# Patient Record
Sex: Male | Born: 1960
Health system: Southern US, Community
[De-identification: ages and names within clinical notes are randomized; demographics above are authoritative.]

## PROBLEM LIST (undated history)

## (undated) DIAGNOSIS — Z21 Asymptomatic human immunodeficiency virus [HIV] infection status: Secondary | ICD-10-CM

## (undated) DIAGNOSIS — F411 Generalized anxiety disorder: Secondary | ICD-10-CM

## (undated) DIAGNOSIS — N200 Calculus of kidney: Secondary | ICD-10-CM

## (undated) DIAGNOSIS — M5412 Radiculopathy, cervical region: Secondary | ICD-10-CM

## (undated) DIAGNOSIS — E785 Hyperlipidemia, unspecified: Secondary | ICD-10-CM

## (undated) DIAGNOSIS — B181 Chronic viral hepatitis B without delta-agent: Secondary | ICD-10-CM

## (undated) DIAGNOSIS — L408 Other psoriasis: Secondary | ICD-10-CM

## (undated) DIAGNOSIS — K219 Gastro-esophageal reflux disease without esophagitis: Secondary | ICD-10-CM

## (undated) DIAGNOSIS — I10 Essential (primary) hypertension: Secondary | ICD-10-CM

## (undated) DIAGNOSIS — E669 Obesity, unspecified: Secondary | ICD-10-CM

## (undated) DIAGNOSIS — B20 Human immunodeficiency virus [HIV] disease: Secondary | ICD-10-CM

## (undated) DIAGNOSIS — M1990 Unspecified osteoarthritis, unspecified site: Secondary | ICD-10-CM

## (undated) DIAGNOSIS — A6000 Herpesviral infection of urogenital system, unspecified: Secondary | ICD-10-CM

## (undated) DIAGNOSIS — IMO0002 Reserved for concepts with insufficient information to code with codable children: Secondary | ICD-10-CM

## (undated) DIAGNOSIS — G5601 Carpal tunnel syndrome, right upper limb: Secondary | ICD-10-CM

## (undated) HISTORY — DX: Human immunodeficiency virus (HIV) disease: B20

## (undated) HISTORY — DX: Asymptomatic human immunodeficiency virus (hiv) infection status: Z21

## (undated) HISTORY — DX: Generalized anxiety disorder: F41.1

## (undated) HISTORY — DX: Other psoriasis: L40.8

## (undated) HISTORY — DX: Gastro-esophageal reflux disease without esophagitis: K21.9

## (undated) HISTORY — DX: Hyperlipidemia, unspecified: E78.5

## (undated) HISTORY — DX: Obesity, unspecified: E66.9

## (undated) HISTORY — DX: Unspecified osteoarthritis, unspecified site: M19.90

## (undated) HISTORY — DX: Carpal tunnel syndrome, right upper limb: G56.01

## (undated) HISTORY — DX: Chronic viral hepatitis B without delta-agent: B18.1

## (undated) HISTORY — PX: COLONOSCOPY: SHX174

## (undated) HISTORY — DX: Calculus of kidney: N20.0

## (undated) HISTORY — DX: Essential (primary) hypertension: I10

## (undated) HISTORY — DX: Herpesviral infection of urogenital system, unspecified: A60.00

## (undated) HISTORY — DX: Reserved for concepts with insufficient information to code with codable children: IMO0002

## (undated) HISTORY — DX: Radiculopathy, cervical region: M54.12

---

## 1998-07-25 HISTORY — PX: CYSTECTOMY: SUR359

## 1998-09-23 ENCOUNTER — Emergency Department (HOSPITAL_COMMUNITY): Admission: EM | Admit: 1998-09-23 | Discharge: 1998-09-23 | Payer: Self-pay | Admitting: Emergency Medicine

## 1998-09-23 ENCOUNTER — Encounter: Payer: Self-pay | Admitting: Emergency Medicine

## 1999-01-06 ENCOUNTER — Encounter (INDEPENDENT_AMBULATORY_CARE_PROVIDER_SITE_OTHER): Payer: Self-pay | Admitting: *Deleted

## 1999-01-06 ENCOUNTER — Ambulatory Visit (HOSPITAL_COMMUNITY): Admission: RE | Admit: 1999-01-06 | Discharge: 1999-01-06 | Payer: Self-pay | Admitting: Internal Medicine

## 1999-01-06 ENCOUNTER — Encounter: Admission: RE | Admit: 1999-01-06 | Discharge: 1999-01-06 | Payer: Self-pay | Admitting: Internal Medicine

## 1999-01-06 ENCOUNTER — Encounter: Payer: Self-pay | Admitting: Internal Medicine

## 1999-01-06 LAB — CONVERTED CEMR LAB: CD4 Count: 9 microliters

## 1999-01-09 ENCOUNTER — Encounter: Payer: Self-pay | Admitting: Emergency Medicine

## 1999-01-09 ENCOUNTER — Inpatient Hospital Stay (HOSPITAL_COMMUNITY): Admission: EM | Admit: 1999-01-09 | Discharge: 1999-01-11 | Payer: Self-pay | Admitting: Emergency Medicine

## 1999-01-25 ENCOUNTER — Encounter: Admission: RE | Admit: 1999-01-25 | Discharge: 1999-01-25 | Payer: Self-pay | Admitting: Internal Medicine

## 1999-02-12 ENCOUNTER — Encounter: Admission: RE | Admit: 1999-02-12 | Discharge: 1999-02-12 | Payer: Self-pay | Admitting: Internal Medicine

## 1999-02-12 ENCOUNTER — Ambulatory Visit (HOSPITAL_COMMUNITY): Admission: RE | Admit: 1999-02-12 | Discharge: 1999-02-12 | Payer: Self-pay | Admitting: Internal Medicine

## 1999-03-01 ENCOUNTER — Encounter: Admission: RE | Admit: 1999-03-01 | Discharge: 1999-03-01 | Payer: Self-pay | Admitting: Internal Medicine

## 1999-05-20 ENCOUNTER — Encounter: Admission: RE | Admit: 1999-05-20 | Discharge: 1999-05-20 | Payer: Self-pay | Admitting: Internal Medicine

## 1999-05-20 ENCOUNTER — Ambulatory Visit (HOSPITAL_COMMUNITY): Admission: RE | Admit: 1999-05-20 | Discharge: 1999-05-20 | Payer: Self-pay | Admitting: Internal Medicine

## 1999-05-31 ENCOUNTER — Encounter: Admission: RE | Admit: 1999-05-31 | Discharge: 1999-05-31 | Payer: Self-pay | Admitting: Internal Medicine

## 1999-06-14 ENCOUNTER — Other Ambulatory Visit: Admission: RE | Admit: 1999-06-14 | Discharge: 1999-06-14 | Payer: Self-pay

## 1999-06-14 ENCOUNTER — Encounter (INDEPENDENT_AMBULATORY_CARE_PROVIDER_SITE_OTHER): Payer: Self-pay

## 1999-07-08 ENCOUNTER — Ambulatory Visit (HOSPITAL_COMMUNITY): Admission: RE | Admit: 1999-07-08 | Discharge: 1999-07-08 | Payer: Self-pay | Admitting: Internal Medicine

## 1999-07-08 ENCOUNTER — Encounter: Payer: Self-pay | Admitting: Internal Medicine

## 1999-07-08 ENCOUNTER — Encounter: Admission: RE | Admit: 1999-07-08 | Discharge: 1999-07-08 | Payer: Self-pay | Admitting: Internal Medicine

## 1999-09-27 ENCOUNTER — Ambulatory Visit (HOSPITAL_COMMUNITY): Admission: RE | Admit: 1999-09-27 | Discharge: 1999-09-27 | Payer: Self-pay | Admitting: Internal Medicine

## 1999-09-27 ENCOUNTER — Encounter: Admission: RE | Admit: 1999-09-27 | Discharge: 1999-09-27 | Payer: Self-pay | Admitting: Internal Medicine

## 1999-10-11 ENCOUNTER — Encounter: Admission: RE | Admit: 1999-10-11 | Discharge: 1999-10-11 | Payer: Self-pay | Admitting: Internal Medicine

## 1999-12-15 ENCOUNTER — Ambulatory Visit (HOSPITAL_COMMUNITY): Admission: RE | Admit: 1999-12-15 | Discharge: 1999-12-15 | Payer: Self-pay | Admitting: Internal Medicine

## 1999-12-15 ENCOUNTER — Encounter: Admission: RE | Admit: 1999-12-15 | Discharge: 1999-12-15 | Payer: Self-pay | Admitting: Internal Medicine

## 1999-12-28 ENCOUNTER — Encounter: Admission: RE | Admit: 1999-12-28 | Discharge: 1999-12-28 | Payer: Self-pay | Admitting: Internal Medicine

## 2000-04-19 ENCOUNTER — Encounter: Admission: RE | Admit: 2000-04-19 | Discharge: 2000-04-19 | Payer: Self-pay | Admitting: Internal Medicine

## 2000-04-19 ENCOUNTER — Ambulatory Visit (HOSPITAL_COMMUNITY): Admission: RE | Admit: 2000-04-19 | Discharge: 2000-04-19 | Payer: Self-pay | Admitting: Internal Medicine

## 2000-04-24 ENCOUNTER — Encounter: Admission: RE | Admit: 2000-04-24 | Discharge: 2000-04-24 | Payer: Self-pay | Admitting: Internal Medicine

## 2000-05-18 ENCOUNTER — Ambulatory Visit (HOSPITAL_COMMUNITY): Admission: RE | Admit: 2000-05-18 | Discharge: 2000-05-18 | Payer: Self-pay | Admitting: Internal Medicine

## 2000-05-18 ENCOUNTER — Encounter: Admission: RE | Admit: 2000-05-18 | Discharge: 2000-05-18 | Payer: Self-pay | Admitting: Internal Medicine

## 2000-08-15 ENCOUNTER — Encounter: Payer: Self-pay | Admitting: Internal Medicine

## 2000-08-15 ENCOUNTER — Encounter: Admission: RE | Admit: 2000-08-15 | Discharge: 2000-08-15 | Payer: Self-pay | Admitting: Internal Medicine

## 2000-08-15 ENCOUNTER — Ambulatory Visit (HOSPITAL_COMMUNITY): Admission: RE | Admit: 2000-08-15 | Discharge: 2000-08-15 | Payer: Self-pay | Admitting: Internal Medicine

## 2000-08-29 ENCOUNTER — Encounter: Admission: RE | Admit: 2000-08-29 | Discharge: 2000-08-29 | Payer: Self-pay | Admitting: Internal Medicine

## 2000-11-23 ENCOUNTER — Ambulatory Visit (HOSPITAL_COMMUNITY): Admission: RE | Admit: 2000-11-23 | Discharge: 2000-11-23 | Payer: Self-pay | Admitting: Internal Medicine

## 2000-11-23 ENCOUNTER — Encounter: Admission: RE | Admit: 2000-11-23 | Discharge: 2000-11-23 | Payer: Self-pay | Admitting: Internal Medicine

## 2001-01-22 ENCOUNTER — Encounter: Admission: RE | Admit: 2001-01-22 | Discharge: 2001-01-22 | Payer: Self-pay | Admitting: Infectious Diseases

## 2001-04-09 ENCOUNTER — Encounter: Admission: RE | Admit: 2001-04-09 | Discharge: 2001-04-09 | Payer: Self-pay | Admitting: Internal Medicine

## 2001-04-09 ENCOUNTER — Ambulatory Visit (HOSPITAL_COMMUNITY): Admission: RE | Admit: 2001-04-09 | Discharge: 2001-04-09 | Payer: Self-pay | Admitting: Internal Medicine

## 2001-05-02 ENCOUNTER — Encounter: Admission: RE | Admit: 2001-05-02 | Discharge: 2001-05-02 | Payer: Self-pay | Admitting: Internal Medicine

## 2001-05-02 ENCOUNTER — Encounter: Payer: Self-pay | Admitting: Internal Medicine

## 2001-05-02 ENCOUNTER — Ambulatory Visit (HOSPITAL_COMMUNITY): Admission: RE | Admit: 2001-05-02 | Discharge: 2001-05-02 | Payer: Self-pay | Admitting: Internal Medicine

## 2001-05-29 ENCOUNTER — Encounter: Admission: RE | Admit: 2001-05-29 | Discharge: 2001-05-29 | Payer: Self-pay | Admitting: Internal Medicine

## 2001-10-16 ENCOUNTER — Ambulatory Visit (HOSPITAL_COMMUNITY): Admission: RE | Admit: 2001-10-16 | Discharge: 2001-10-16 | Payer: Self-pay | Admitting: Internal Medicine

## 2001-10-16 ENCOUNTER — Encounter: Admission: RE | Admit: 2001-10-16 | Discharge: 2001-10-16 | Payer: Self-pay | Admitting: Internal Medicine

## 2001-10-23 ENCOUNTER — Encounter: Admission: RE | Admit: 2001-10-23 | Discharge: 2001-10-23 | Payer: Self-pay | Admitting: Internal Medicine

## 2002-02-07 ENCOUNTER — Ambulatory Visit (HOSPITAL_COMMUNITY): Admission: RE | Admit: 2002-02-07 | Discharge: 2002-02-07 | Payer: Self-pay | Admitting: Internal Medicine

## 2002-02-07 ENCOUNTER — Encounter: Admission: RE | Admit: 2002-02-07 | Discharge: 2002-02-07 | Payer: Self-pay | Admitting: Internal Medicine

## 2002-02-25 ENCOUNTER — Encounter: Admission: RE | Admit: 2002-02-25 | Discharge: 2002-02-25 | Payer: Self-pay | Admitting: Internal Medicine

## 2002-04-15 ENCOUNTER — Encounter: Admission: RE | Admit: 2002-04-15 | Discharge: 2002-04-15 | Payer: Self-pay | Admitting: Internal Medicine

## 2002-04-15 ENCOUNTER — Ambulatory Visit (HOSPITAL_COMMUNITY): Admission: RE | Admit: 2002-04-15 | Discharge: 2002-04-15 | Payer: Self-pay | Admitting: Internal Medicine

## 2002-05-07 ENCOUNTER — Encounter: Admission: RE | Admit: 2002-05-07 | Discharge: 2002-05-07 | Payer: Self-pay | Admitting: Internal Medicine

## 2002-06-05 ENCOUNTER — Encounter: Admission: RE | Admit: 2002-06-05 | Discharge: 2002-06-05 | Payer: Self-pay | Admitting: Infectious Diseases

## 2002-08-05 ENCOUNTER — Ambulatory Visit (HOSPITAL_COMMUNITY): Admission: RE | Admit: 2002-08-05 | Discharge: 2002-08-05 | Payer: Self-pay | Admitting: Internal Medicine

## 2002-08-05 ENCOUNTER — Encounter: Admission: RE | Admit: 2002-08-05 | Discharge: 2002-08-05 | Payer: Self-pay | Admitting: Internal Medicine

## 2002-10-15 ENCOUNTER — Encounter: Admission: RE | Admit: 2002-10-15 | Discharge: 2002-10-15 | Payer: Self-pay | Admitting: Internal Medicine

## 2003-01-24 ENCOUNTER — Ambulatory Visit (HOSPITAL_COMMUNITY): Admission: RE | Admit: 2003-01-24 | Discharge: 2003-01-24 | Payer: Self-pay | Admitting: Internal Medicine

## 2003-01-24 ENCOUNTER — Encounter: Payer: Self-pay | Admitting: Internal Medicine

## 2003-02-10 ENCOUNTER — Encounter: Admission: RE | Admit: 2003-02-10 | Discharge: 2003-02-10 | Payer: Self-pay | Admitting: Internal Medicine

## 2003-06-17 ENCOUNTER — Ambulatory Visit (HOSPITAL_COMMUNITY): Admission: RE | Admit: 2003-06-17 | Discharge: 2003-06-17 | Payer: Self-pay | Admitting: Internal Medicine

## 2003-06-17 ENCOUNTER — Encounter: Admission: RE | Admit: 2003-06-17 | Discharge: 2003-06-17 | Payer: Self-pay | Admitting: Internal Medicine

## 2003-06-17 ENCOUNTER — Encounter: Payer: Self-pay | Admitting: Internal Medicine

## 2003-07-01 ENCOUNTER — Encounter: Admission: RE | Admit: 2003-07-01 | Discharge: 2003-07-01 | Payer: Self-pay | Admitting: Internal Medicine

## 2003-07-28 ENCOUNTER — Encounter: Admission: RE | Admit: 2003-07-28 | Discharge: 2003-07-28 | Payer: Self-pay | Admitting: Infectious Diseases

## 2004-01-13 ENCOUNTER — Encounter: Admission: RE | Admit: 2004-01-13 | Discharge: 2004-01-13 | Payer: Self-pay | Admitting: Internal Medicine

## 2004-01-13 ENCOUNTER — Ambulatory Visit (HOSPITAL_COMMUNITY): Admission: RE | Admit: 2004-01-13 | Discharge: 2004-01-13 | Payer: Self-pay | Admitting: Internal Medicine

## 2004-01-27 ENCOUNTER — Encounter: Admission: RE | Admit: 2004-01-27 | Discharge: 2004-01-27 | Payer: Self-pay | Admitting: Internal Medicine

## 2004-06-21 ENCOUNTER — Ambulatory Visit: Payer: Self-pay | Admitting: Internal Medicine

## 2004-08-02 ENCOUNTER — Ambulatory Visit (HOSPITAL_COMMUNITY): Admission: RE | Admit: 2004-08-02 | Discharge: 2004-08-02 | Payer: Self-pay | Admitting: Internal Medicine

## 2004-08-02 ENCOUNTER — Ambulatory Visit: Payer: Self-pay | Admitting: Internal Medicine

## 2004-08-16 ENCOUNTER — Ambulatory Visit: Payer: Self-pay | Admitting: Internal Medicine

## 2004-12-27 ENCOUNTER — Ambulatory Visit: Payer: Self-pay | Admitting: Internal Medicine

## 2004-12-27 ENCOUNTER — Ambulatory Visit (HOSPITAL_COMMUNITY): Admission: RE | Admit: 2004-12-27 | Discharge: 2004-12-27 | Payer: Self-pay | Admitting: Internal Medicine

## 2005-01-11 ENCOUNTER — Ambulatory Visit: Payer: Self-pay | Admitting: Internal Medicine

## 2005-06-14 ENCOUNTER — Ambulatory Visit (HOSPITAL_COMMUNITY): Admission: RE | Admit: 2005-06-14 | Discharge: 2005-06-14 | Payer: Self-pay | Admitting: Internal Medicine

## 2005-06-14 ENCOUNTER — Encounter (INDEPENDENT_AMBULATORY_CARE_PROVIDER_SITE_OTHER): Payer: Self-pay | Admitting: *Deleted

## 2005-06-14 ENCOUNTER — Ambulatory Visit: Payer: Self-pay | Admitting: Internal Medicine

## 2005-06-14 LAB — CONVERTED CEMR LAB: CD4 Count: 270 microliters

## 2005-06-28 ENCOUNTER — Ambulatory Visit: Payer: Self-pay | Admitting: Internal Medicine

## 2006-01-19 ENCOUNTER — Encounter: Admission: RE | Admit: 2006-01-19 | Discharge: 2006-01-19 | Payer: Self-pay | Admitting: Internal Medicine

## 2006-01-19 ENCOUNTER — Ambulatory Visit: Payer: Self-pay | Admitting: Internal Medicine

## 2006-01-19 ENCOUNTER — Encounter (INDEPENDENT_AMBULATORY_CARE_PROVIDER_SITE_OTHER): Payer: Self-pay | Admitting: *Deleted

## 2006-01-22 DIAGNOSIS — J189 Pneumonia, unspecified organism: Secondary | ICD-10-CM | POA: Insufficient documentation

## 2006-02-14 ENCOUNTER — Ambulatory Visit: Payer: Self-pay | Admitting: Internal Medicine

## 2006-07-14 ENCOUNTER — Emergency Department (HOSPITAL_COMMUNITY): Admission: EM | Admit: 2006-07-14 | Discharge: 2006-07-14 | Payer: Self-pay | Admitting: Family Medicine

## 2006-07-28 DIAGNOSIS — B181 Chronic viral hepatitis B without delta-agent: Secondary | ICD-10-CM

## 2006-07-28 DIAGNOSIS — B20 Human immunodeficiency virus [HIV] disease: Secondary | ICD-10-CM

## 2006-07-28 DIAGNOSIS — K219 Gastro-esophageal reflux disease without esophagitis: Secondary | ICD-10-CM

## 2006-07-28 HISTORY — DX: Gastro-esophageal reflux disease without esophagitis: K21.9

## 2006-07-28 HISTORY — DX: Chronic viral hepatitis B without delta-agent: B18.1

## 2006-07-28 HISTORY — DX: Human immunodeficiency virus (HIV) disease: B20

## 2006-08-09 ENCOUNTER — Encounter (INDEPENDENT_AMBULATORY_CARE_PROVIDER_SITE_OTHER): Payer: Self-pay | Admitting: *Deleted

## 2006-08-09 ENCOUNTER — Ambulatory Visit: Payer: Self-pay | Admitting: Internal Medicine

## 2006-08-09 ENCOUNTER — Encounter: Admission: RE | Admit: 2006-08-09 | Discharge: 2006-08-09 | Payer: Self-pay | Admitting: Internal Medicine

## 2006-08-09 LAB — CONVERTED CEMR LAB
ALT: 31 units/L (ref 0–53)
AST: 33 units/L (ref 0–37)
Albumin: 5 g/dL (ref 3.5–5.2)
Alkaline Phosphatase: 83 units/L (ref 39–117)
BUN: 13 mg/dL (ref 6–23)
Basophils Absolute: 0 10*3/uL (ref 0.0–0.1)
Basophils Relative: 1 % (ref 0–1)
CD4 Count: 420 microliters
CO2: 29 meq/L (ref 19–32)
Calcium: 9.8 mg/dL (ref 8.4–10.5)
Chloride: 104 meq/L (ref 96–112)
Cholesterol: 221 mg/dL — ABNORMAL HIGH (ref 0–200)
Creatinine, Ser: 0.9 mg/dL (ref 0.40–1.50)
Eosinophils Relative: 1 % (ref 0–5)
Glucose, Bld: 97 mg/dL (ref 70–99)
HCT: 44.6 % (ref 39.0–52.0)
HDL: 52 mg/dL (ref >39)
HIV 1 RNA Quant: 94 copies/mL — ABNORMAL HIGH (ref <50)
HIV-1 RNA Quant, Log: 1.97 — ABNORMAL HIGH (ref <1.70)
Hemoglobin: 15.4 g/dL (ref 13.0–17.0)
LDL Cholesterol: 154 mg/dL — ABNORMAL HIGH (ref 0–99)
Lymphocytes Relative: 32 % (ref 12–46)
Lymphs Abs: 1.7 10*3/uL (ref 0.7–3.3)
MCHC: 34.5 g/dL (ref 30.0–36.0)
MCV: 95.1 fL (ref 78.0–100.0)
Monocytes Absolute: 0.4 10*3/uL (ref 0.2–0.7)
Monocytes Relative: 7 % (ref 3–11)
Neutro Abs: 3.2 10*3/uL (ref 1.7–7.7)
Neutrophils Relative %: 59 % (ref 43–77)
Platelets: 132 10*3/uL — ABNORMAL LOW (ref 150–400)
Potassium: 4.4 meq/L (ref 3.5–5.3)
RBC: 4.69 M/uL (ref 4.22–5.81)
RDW: 13.6 % (ref 11.5–14.0)
Sodium: 140 meq/L (ref 135–145)
Total Bilirubin: 0.5 mg/dL (ref 0.3–1.2)
Total CHOL/HDL Ratio: 4.3
Total Protein: 7.9 g/dL (ref 6.0–8.3)
Triglycerides: 73 mg/dL (ref <150)
VLDL: 15 mg/dL (ref 0–40)
WBC: 5.4 10*3/uL (ref 4.0–10.5)

## 2006-08-22 ENCOUNTER — Ambulatory Visit: Payer: Self-pay | Admitting: Internal Medicine

## 2006-08-22 DIAGNOSIS — N200 Calculus of kidney: Secondary | ICD-10-CM | POA: Insufficient documentation

## 2006-08-22 DIAGNOSIS — IMO0002 Reserved for concepts with insufficient information to code with codable children: Secondary | ICD-10-CM | POA: Insufficient documentation

## 2006-08-22 DIAGNOSIS — R071 Chest pain on breathing: Secondary | ICD-10-CM | POA: Insufficient documentation

## 2006-08-22 DIAGNOSIS — L408 Other psoriasis: Secondary | ICD-10-CM

## 2006-08-22 DIAGNOSIS — A6000 Herpesviral infection of urogenital system, unspecified: Secondary | ICD-10-CM | POA: Insufficient documentation

## 2006-08-22 DIAGNOSIS — M5412 Radiculopathy, cervical region: Secondary | ICD-10-CM

## 2006-08-22 DIAGNOSIS — A630 Anogenital (venereal) warts: Secondary | ICD-10-CM | POA: Insufficient documentation

## 2006-08-22 DIAGNOSIS — F411 Generalized anxiety disorder: Secondary | ICD-10-CM

## 2006-08-22 HISTORY — DX: Other psoriasis: L40.8

## 2006-08-22 HISTORY — DX: Calculus of kidney: N20.0

## 2006-08-22 HISTORY — DX: Herpesviral infection of urogenital system, unspecified: A60.00

## 2006-08-22 HISTORY — DX: Reserved for concepts with insufficient information to code with codable children: IMO0002

## 2006-08-22 HISTORY — DX: Generalized anxiety disorder: F41.1

## 2006-08-22 HISTORY — DX: Radiculopathy, cervical region: M54.12

## 2006-09-04 ENCOUNTER — Emergency Department (HOSPITAL_COMMUNITY): Admission: EM | Admit: 2006-09-04 | Discharge: 2006-09-04 | Payer: Self-pay | Admitting: Family Medicine

## 2006-09-04 ENCOUNTER — Encounter: Payer: Self-pay | Admitting: Internal Medicine

## 2006-09-18 ENCOUNTER — Encounter (INDEPENDENT_AMBULATORY_CARE_PROVIDER_SITE_OTHER): Payer: Self-pay | Admitting: *Deleted

## 2006-09-18 LAB — CONVERTED CEMR LAB

## 2006-10-01 ENCOUNTER — Encounter (INDEPENDENT_AMBULATORY_CARE_PROVIDER_SITE_OTHER): Payer: Self-pay | Admitting: *Deleted

## 2006-10-09 ENCOUNTER — Encounter: Payer: Self-pay | Admitting: Internal Medicine

## 2006-10-11 ENCOUNTER — Telehealth (INDEPENDENT_AMBULATORY_CARE_PROVIDER_SITE_OTHER): Payer: Self-pay | Admitting: *Deleted

## 2006-10-16 ENCOUNTER — Telehealth: Payer: Self-pay | Admitting: Internal Medicine

## 2007-04-26 ENCOUNTER — Ambulatory Visit: Payer: Self-pay | Admitting: Infectious Diseases

## 2007-04-26 ENCOUNTER — Encounter: Payer: Self-pay | Admitting: Internal Medicine

## 2007-04-26 ENCOUNTER — Encounter: Admission: RE | Admit: 2007-04-26 | Discharge: 2007-04-26 | Payer: Self-pay | Admitting: Infectious Diseases

## 2007-04-26 LAB — CONVERTED CEMR LAB
AST: 29 units/L (ref 0–37)
BUN: 19 mg/dL (ref 6–23)
Basophils Relative: 1 % (ref 0–1)
CO2: 22 meq/L (ref 19–32)
Calcium: 9.2 mg/dL (ref 8.4–10.5)
Chloride: 107 meq/L (ref 96–112)
Creatinine, Ser: 0.85 mg/dL (ref 0.40–1.50)
Glucose, Bld: 92 mg/dL (ref 70–99)
HIV 1 RNA Quant: 82 copies/mL — ABNORMAL HIGH (ref <50)
HIV-1 RNA Quant, Log: 1.91 — ABNORMAL HIGH (ref <1.70)
Hemoglobin: 14.9 g/dL (ref 13.0–17.0)
MCHC: 32.6 g/dL (ref 30.0–36.0)
Monocytes Absolute: 0.4 10*3/uL (ref 0.2–0.7)
Monocytes Relative: 9 % (ref 3–11)
Neutro Abs: 2.4 10*3/uL (ref 1.7–7.7)
RBC: 4.58 M/uL (ref 4.22–5.81)

## 2007-05-08 ENCOUNTER — Ambulatory Visit: Payer: Self-pay | Admitting: Internal Medicine

## 2007-05-08 DIAGNOSIS — F17200 Nicotine dependence, unspecified, uncomplicated: Secondary | ICD-10-CM | POA: Insufficient documentation

## 2007-05-08 DIAGNOSIS — Z87891 Personal history of nicotine dependence: Secondary | ICD-10-CM | POA: Insufficient documentation

## 2007-08-21 ENCOUNTER — Emergency Department (HOSPITAL_COMMUNITY): Admission: EM | Admit: 2007-08-21 | Discharge: 2007-08-21 | Payer: Self-pay | Admitting: *Deleted

## 2007-08-30 ENCOUNTER — Telehealth: Payer: Self-pay | Admitting: Internal Medicine

## 2008-01-11 ENCOUNTER — Ambulatory Visit: Payer: Self-pay | Admitting: Internal Medicine

## 2008-01-11 ENCOUNTER — Encounter: Admission: RE | Admit: 2008-01-11 | Discharge: 2008-01-11 | Payer: Self-pay | Admitting: Internal Medicine

## 2008-01-11 LAB — CONVERTED CEMR LAB
ALT: 27 units/L (ref 0–53)
Albumin: 4.6 g/dL (ref 3.5–5.2)
CO2: 23 meq/L (ref 19–32)
Chloride: 105 meq/L (ref 96–112)
Cholesterol: 177 mg/dL (ref 0–200)
Glucose, Bld: 100 mg/dL — ABNORMAL HIGH (ref 70–99)
HEP B PCR: 959 — ABNORMAL HIGH (ref <100)
Potassium: 4.4 meq/L (ref 3.5–5.3)
RBC: 4.48 M/uL (ref 4.22–5.81)
Sodium: 141 meq/L (ref 135–145)
Total Protein: 7.1 g/dL (ref 6.0–8.3)
Triglycerides: 147 mg/dL (ref <150)
WBC: 4 10*3/uL (ref 4.0–10.5)

## 2008-01-30 ENCOUNTER — Ambulatory Visit: Payer: Self-pay | Admitting: Internal Medicine

## 2008-03-07 ENCOUNTER — Encounter: Payer: Self-pay | Admitting: Internal Medicine

## 2008-06-12 ENCOUNTER — Ambulatory Visit: Payer: Self-pay | Admitting: Internal Medicine

## 2008-06-12 LAB — CONVERTED CEMR LAB
ALT: 29 units/L (ref 0–53)
AST: 28 units/L (ref 0–37)
Alkaline Phosphatase: 62 units/L (ref 39–117)
Basophils Absolute: 0.1 10*3/uL (ref 0.0–0.1)
Basophils Relative: 1 % (ref 0–1)
CO2: 24 meq/L (ref 19–32)
Creatinine, Ser: 0.97 mg/dL (ref 0.40–1.50)
Eosinophils Absolute: 0.1 10*3/uL (ref 0.0–0.7)
Eosinophils Relative: 2 % (ref 0–5)
HCT: 41.6 % (ref 39.0–52.0)
HIV 1 RNA Quant: <48 copies/mL (ref <48)
Hemoglobin: 14.3 g/dL (ref 13.0–17.0)
Lymphocytes Relative: 31 % (ref 12–46)
MCHC: 34.4 g/dL (ref 30.0–36.0)
MCV: 95.9 fL (ref 78.0–100.0)
Monocytes Absolute: 0.4 10*3/uL (ref 0.1–1.0)
Platelets: 129 10*3/uL — ABNORMAL LOW (ref 150–400)
RDW: 12.9 % (ref 11.5–15.5)
Sodium: 141 meq/L (ref 135–145)
Total Bilirubin: 0.4 mg/dL (ref 0.3–1.2)
Total Protein: 6.8 g/dL (ref 6.0–8.3)

## 2008-06-24 ENCOUNTER — Ambulatory Visit: Payer: Self-pay | Admitting: Internal Medicine

## 2008-12-09 ENCOUNTER — Ambulatory Visit: Payer: Self-pay | Admitting: Internal Medicine

## 2008-12-09 LAB — CONVERTED CEMR LAB
AST: 35 units/L (ref 0–37)
Albumin: 4.5 g/dL (ref 3.5–5.2)
BUN: 18 mg/dL (ref 6–23)
Basophils Relative: 1 % (ref 0–1)
CO2: 24 meq/L (ref 19–32)
Calcium: 9.5 mg/dL (ref 8.4–10.5)
Chloride: 106 meq/L (ref 96–112)
Cholesterol: 184 mg/dL (ref 0–200)
Creatinine, Ser: 0.89 mg/dL (ref 0.40–1.50)
Eosinophils Absolute: 0.1 10*3/uL (ref 0.0–0.7)
GFR calc Af Amer: >60 mL/min (ref >60)
HIV 1 RNA Quant: <48 copies/mL (ref <48)
HIV-1 RNA Quant, Log: <1.68 (ref <1.68)
Lymphs Abs: 1.6 10*3/uL (ref 0.7–4.0)
MCHC: 32.9 g/dL (ref 30.0–36.0)
MCV: 99.3 fL (ref 78.0–100.0)
Neutro Abs: 2.7 10*3/uL (ref 1.7–7.7)
Neutrophils Relative %: 58 % (ref 43–77)
Platelets: 124 10*3/uL — ABNORMAL LOW (ref 150–400)
Potassium: 4.7 meq/L (ref 3.5–5.3)
WBC: 4.6 10*3/uL (ref 4.0–10.5)

## 2008-12-23 ENCOUNTER — Ambulatory Visit: Payer: Self-pay | Admitting: Internal Medicine

## 2009-06-23 ENCOUNTER — Ambulatory Visit: Payer: Self-pay | Admitting: Internal Medicine

## 2009-06-23 ENCOUNTER — Encounter: Payer: Self-pay | Admitting: Infectious Diseases

## 2009-06-23 LAB — CONVERTED CEMR LAB
ALT: 49 units/L (ref 0–53)
AST: 111 units/L — ABNORMAL HIGH (ref 0–37)
Albumin: 4.6 g/dL (ref 3.5–5.2)
Alkaline Phosphatase: 55 units/L (ref 39–117)
Chloride: 106 meq/L (ref 96–112)
Potassium: 4.6 meq/L (ref 3.5–5.3)
Sodium: 142 meq/L (ref 135–145)
Total Protein: 6.9 g/dL (ref 6.0–8.3)

## 2009-07-07 ENCOUNTER — Ambulatory Visit: Payer: Self-pay | Admitting: Internal Medicine

## 2009-07-07 DIAGNOSIS — I10 Essential (primary) hypertension: Secondary | ICD-10-CM | POA: Insufficient documentation

## 2009-07-07 DIAGNOSIS — IMO0002 Reserved for concepts with insufficient information to code with codable children: Secondary | ICD-10-CM

## 2009-07-07 HISTORY — DX: Reserved for concepts with insufficient information to code with codable children: IMO0002

## 2009-07-08 ENCOUNTER — Telehealth: Payer: Self-pay | Admitting: Internal Medicine

## 2009-10-06 ENCOUNTER — Ambulatory Visit: Payer: Self-pay | Admitting: Internal Medicine

## 2009-10-06 LAB — CONVERTED CEMR LAB
AST: 37 units/L (ref 0–37)
BUN: 16 mg/dL (ref 6–23)
Basophils Relative: 1 % (ref 0–1)
Calcium: 9.4 mg/dL (ref 8.4–10.5)
Chloride: 104 meq/L (ref 96–112)
Cholesterol: 202 mg/dL — ABNORMAL HIGH (ref 0–200)
Creatinine, Ser: 0.89 mg/dL (ref 0.40–1.50)
Eosinophils Absolute: 0.1 10*3/uL (ref 0.0–0.7)
Eosinophils Relative: 2 % (ref 0–5)
HCT: 42.6 % (ref 39.0–52.0)
HDL: 46 mg/dL (ref >39)
Lymphs Abs: 1.5 10*3/uL (ref 0.7–4.0)
MCHC: 33.6 g/dL (ref 30.0–36.0)
MCV: 96.2 fL (ref >=78.0)
Platelets: 129 10*3/uL — ABNORMAL LOW (ref 150–400)
RDW: 12.8 % (ref 11.5–15.5)
Total CHOL/HDL Ratio: 4.4
WBC: 3.6 10*3/uL — ABNORMAL LOW (ref 4.0–10.5)

## 2009-10-27 ENCOUNTER — Ambulatory Visit: Payer: Self-pay | Admitting: Internal Medicine

## 2010-05-12 ENCOUNTER — Ambulatory Visit: Payer: Self-pay | Admitting: Internal Medicine

## 2010-05-12 LAB — CONVERTED CEMR LAB: HIV-1 RNA Quant, Log: <1.3 (ref <1.30)

## 2010-05-21 ENCOUNTER — Encounter (INDEPENDENT_AMBULATORY_CARE_PROVIDER_SITE_OTHER): Payer: Self-pay | Admitting: *Deleted

## 2010-05-27 ENCOUNTER — Ambulatory Visit: Payer: Self-pay | Admitting: Internal Medicine

## 2010-05-27 DIAGNOSIS — M545 Low back pain, unspecified: Secondary | ICD-10-CM | POA: Insufficient documentation

## 2010-07-22 ENCOUNTER — Emergency Department (HOSPITAL_COMMUNITY)
Admission: EM | Admit: 2010-07-22 | Discharge: 2010-07-22 | Payer: Self-pay | Source: Home / Self Care | Admitting: Emergency Medicine

## 2010-07-28 ENCOUNTER — Telehealth (INDEPENDENT_AMBULATORY_CARE_PROVIDER_SITE_OTHER): Payer: Self-pay | Admitting: *Deleted

## 2010-07-28 ENCOUNTER — Ambulatory Visit
Admission: RE | Admit: 2010-07-28 | Discharge: 2010-07-28 | Payer: Self-pay | Source: Home / Self Care | Attending: Adult Health | Admitting: Adult Health

## 2010-07-28 DIAGNOSIS — H1030 Unspecified acute conjunctivitis, unspecified eye: Secondary | ICD-10-CM | POA: Insufficient documentation

## 2010-08-24 NOTE — Assessment & Plan Note (Signed)
Summary: F/U/VS   CC:  follow-up visit.  History of Present Illness: Anthony Steele is in for his routine visit.  He recalls missing a two to 3 doses of his Atripla  in January when the pharmacy was late refilling his prescription.  He is on Chantix and smoked his last cigarette 6 weeks ago.  He has cut out salt in his diet and is watching labels very closely.  His friend, Anthony Steele, has been checking his blood pressure and says it is normal each week.  Preventive Screening-Counseling & Management  Alcohol-Tobacco     Alcohol drinks/day: 1 bottle per week     Alcohol type: wine     Smoking Status: quit < 6 months     Smoking Cessation Counseling: yes     Smoke Cessation Stage: quit     Packs/Day: <0.25     Passive Smoke Exposure: yes  Caffeine-Diet-Exercise     Caffeine use/day: 2 cups of coffee per morning     Does Patient Exercise: yes     Type of exercise: active in yard work     Exercise (avg: min/session): >60     Times/week: 5  Comments: started Chantix, on week # 6 of the program  Hep-HIV-STD-Contraception     HIV Risk: no     HIV Risk Counseling: not indicated-no HIV risk noted  Safety-Violence-Falls     Seat Belt Use: yes      Sexual History:  n/a.        Drug Use:  never.     Updated Prior Medication List: ATRIPLA 600-200-300 MG TABS (EFAVIRENZ-EMTRICITAB-TENOFOVIR) Take 1 tablet by mouth at bedtime VALTREX 500 MG TABS (VALACYCLOVIR HCL) Take 1 tablet by mouth two times a day prn ACIPHEX 20 MG TBEC (RABEPRAZOLE SODIUM) Take 1 tablet by mouth once a day CHANTIX CONTINUING MONTH PAK 1 MG TABS (VARENICLINE TARTRATE) Take as directed  Current Allergies (reviewed today): ! * NUTS Social History: Sexual History:  n/a  Vital Signs:  Patient profile:   50 year old male Height:      72 inches (182.88 cm) Weight:      217.25 pounds (98.75 kg) BMI:     29.57 Temp:     98.5 degrees F (36.94 degrees C) oral Pulse rate:   68 / minute BP sitting:   132 / 86  (left  arm) Cuff size:   large  Vitals Entered By: Jennet Maduro RN (October 27, 2009 9:41 AM) CC: follow-up visit Is Patient Diabetic? No Pain Assessment Patient in pain? no      Nutritional Status BMI of 25 - 29 = overweight Nutritional Status Detail appetite "good"  Have you ever been in a relationship where you felt threatened, hurt or afraid?No   Does patient need assistance? Functional Status Self care Ambulation Normal Comments no missed doses of rxes   Physical Exam  General:  alert and well-nourished.  his weight is down 4 pounds. Mouth:  good dentition, pharynx pink and moist, no erythema, and no exudates.   Lungs:  normal breath sounds.  no crackles and no wheezes.   Heart:  normal rate, regular rhythm, and no murmur.          Medication Adherence: 10/27/2009   Adherence to medications reviewed with patient. Counseling to provide adequate adherence provided   Prevention For Positives: 10/27/2009   Safe sex practices discussed with patient. Condoms offered.  Impression & Recommendations:  Problem # 1:  HIV DISEASE (ICD-042) His viral load is up slightly but this is probably due to normal variation.  I will continue his current regimen His updated medication list for this problem includes:    Valtrex 500 Mg Tabs (Valacyclovir hcl) .Marland Kitchen... Take 1 tablet by mouth two times a day prn  Diagnostics Reviewed:  HIV: CDC-defined AIDS (03/07/2008)   CD4: 360 (10/07/2009)   WBC: 3.6 (10/06/2009)   Hgb: 14.3 (10/06/2009)   HCT: 42.6 (10/06/2009)   Platelets: 129 (10/06/2009) HIV-1 RNA: 368 (10/06/2009)   HBSAg: Yes (09/18/2006)  Problem # 2:  HYPERTENSION NEC (ICD-997.91) His blood pressure is under better control with salt restriction and cigarette cessation. Orders: Est. Patient Level IV (11914)  Medications Added to Medication List This Visit: 1)  Aciphex 20 Mg Tbec (Rabeprazole sodium) .... Take 1 tablet by mouth once a day  Other  Orders: Future Orders: T-CD4SP ([facility]) (CD4SP) ... 04/25/2010 T-HIV Viral Load [id number]) ... 04/25/2010  Patient Instructions: 1)  Please schedule a follow-up appointment in 6 months.

## 2010-08-24 NOTE — Assessment & Plan Note (Signed)
Summary: F/U OV/VS   CC:  follow-up visit.  History of Present Illness: Anthony Steele is in for his routine visit.  He is doing very well currently.  He denies missing any doses of his Atripla since his last visit.  He has almost quit smoking cigarettes completely.  He only has about one or two each week and intends to quit completely.  He had one episode of low back pain after working out at the gym several months ago.  He saw Dr. Lajoyce Corners who treated him conservatively with anti-inflammatory agents and the pain slowly subsided.  Preventive Screening-Counseling & Management  Alcohol-Tobacco     Alcohol drinks/day: 1 bottle per week     Alcohol type: wine     Smoking Status: quit < 6 months     Smoking Cessation Counseling: yes     Smoke Cessation Stage: quit     Packs/Day: <0.25     Passive Smoke Exposure: yes  Caffeine-Diet-Exercise     Caffeine use/day: 2 cups of coffee per morning     Does Patient Exercise: yes     Type of exercise: active in yard work     Exercise (avg: min/session): >60     Times/week: 5  Hep-HIV-STD-Contraception     HIV Risk: no     HIV Risk Counseling: not indicated-no HIV risk noted  Safety-Violence-Falls     Seat Belt Use: yes  Comments: declined condoms      Sexual History:  currently monogamous.        Drug Use:  never.     Updated Prior Medication List: ATRIPLA 600-200-300 MG TABS (EFAVIRENZ-EMTRICITAB-TENOFOVIR) Take 1 tablet by mouth at bedtime ACIPHEX 20 MG TBEC (RABEPRAZOLE SODIUM) Take 1 tablet by mouth once a day VALTREX 500 MG TABS (VALACYCLOVIR HCL) Take 1 tablet by mouth two times a day prn  Current Allergies (reviewed today): ! * NUTS Social History: Sexual History:  currently monogamous  Vital Signs:  Patient profile:   50 year old male Height:      72 inches (182.88 cm) Weight:      216.5 pounds (98.41 kg) BMI:     29.47 Temp:     98.4 degrees F (36.89 degrees C) oral Pulse rate:   62 / minute BP sitting:   133 / 87  (left  arm) Cuff size:   large  Vitals Entered By: Jennet Maduro RN (May 27, 2010 9:03 AM) CC: follow-up visit Is Patient Diabetic? No Pain Assessment Patient in pain? no      Nutritional Status BMI of 25 - 29 = overweight Nutritional Status Detail appetite "too good"  Have you ever been in a relationship where you felt threatened, hurt or afraid?No   Does patient need assistance? Functional Status Self care Ambulation Normal Comments no missed doses   Physical Exam  General:  alert and well-nourished.   Mouth:  good dentition, pharynx pink and moist, no erythema, and no exudates.   Lungs:  normal breath sounds.  no crackles and no wheezes.   Heart:  normal rate, regular rhythm, and no murmur.   Abdomen:  soft, non-tender, and normal bowel sounds.   Skin:  no rashes.   Psych:  normally interactive, good eye contact, not anxious appearing, and not depressed appearing.          Medication Adherence: 05/27/2010   Adherence to medications reviewed with patient. Counseling to provide adequate adherence provided  Impression & Recommendations:  Problem # 1:  HIV DISEASE (ICD-042) His infection remains under excellent control.  I will not make any changes today. His updated medication list for this problem includes:    Valtrex 500 Mg Tabs (Valacyclovir hcl) .Marland Kitchen... Take 1 tablet by mouth two times a day prn  Diagnostics Reviewed:  HIV: CDC-defined AIDS (03/07/2008)   CD4: 400 (05/13/2010)   WBC: 3.6 (10/06/2009)   Hgb: 14.3 (10/06/2009)   HCT: 42.6 (10/06/2009)   Platelets: 129 (10/06/2009) HIV-1 RNA: <20 copies/mL (05/12/2010)   HBSAg: Yes (09/18/2006)  Problem # 2:  HYPERTENSION NEC (ICD-997.91) He has very mild, borderline hypertension.  I talked to him about potential lifestyle modifications.  We will simply monitor his blood pressure for now. Orders: Est. Patient Level IV (16109)  Problem # 3:  CIGARETTE SMOKER (ICD-305.1) I  have encouraged him to quit smoking cigarettes completely. The following medications were removed from the medication list:    Chantix Continuing Month Pak 1 Mg Tabs (Varenicline tartrate) .Marland Kitchen... Take as directed  Orders: Est. Patient Level IV (60454)  Problem # 4:  GERD (ICD-530.81) He will continue the Aciphex for now as it is controlling his acid reflux symptoms and without it they returned promptly. His updated medication list for this problem includes:    Aciphex 20 Mg Tbec (Rabeprazole sodium) .Marland Kitchen... Take 1 tablet by mouth once a day  Orders: Est. Patient Level IV (09811)  Other Orders: Influenza Vaccine NON MCR (91478) Future Orders: T-CD4SP ([facility]) (CD4SP) ... 11/23/2010 T-HIV Viral Load [id number]) ... 11/23/2010 T-Comprehensive Metabolic Panel [id number]) ... 11/23/2010 T-CBC w/Diff ([id number]) ... 11/23/2010 T-RPR (Syphilis) [id number]) ... 11/23/2010 T-Lipid Profile [id number]) ... 11/23/2010  Patient Instructions: 1)  Please schedule a follow-up appointment in 6 months.   Prevention & Chronic Care Immunizations   Influenza vaccine: Fluvax Non-MCR  (05/27/2010)    Tetanus booster: Not documented    Pneumococcal vaccine: Not documented  Other Screening   Smoking status: quit < 6 months  (05/27/2010)  Lipids   Total Cholesterol: 202  (10/06/2009)   LDL: 142  (10/06/2009)   LDL Direct: Not documented   HDL: 46  (10/06/2009)   Triglycerides: 69  (10/06/2009)    Influenza Vaccine    Vaccine Type: Fluvax Non-MCR    Site: left deltoid    Mfr: novartis    Dose: 0.5 ml    Route: IM    Given by: Jennet Maduro RN    Exp. Date: 10/24/2010    Lot #: [id number]  Flu Vaccine Consent Questions    Do you have a history of severe allergic reactions to this vaccine? no    Any prior history of allergic reactions to egg and/or gelatin? no    Do you have a sensitivity to the preservative Thimersol? no    Do you have a past history of Guillan-Barre  Syndrome? no    Do you currently have an acute febrile illness? no    Have you ever had a severe reaction to latex? no    Vaccine information given and explained to patient? yes

## 2010-08-24 NOTE — Miscellaneous (Signed)
Summary: [employer] PAP no longer needed, NCADAP approved  Clinical Lists Changes  Pt. no longer needs [employer] PAP.  Approved for NCADAP.  Faxed back info to [place] [facility]. Jennet Maduro RN  May 21, 2010 12:15 PM

## 2010-08-26 NOTE — Assessment & Plan Note (Signed)
Summary: f/u UC visit, cont. prob. of "pink eye" /dde   CC:  pt. c/o right eye redness and seen at urgent care dx conjunctivitis and given Gentamicin with no improvement.  History of Present Illness: > 1 week hx of conjunctivitis OU with OD > OS.  Seen in urgent care last Thurs.  Given Rx for gentmycin eye gtts.  2 gtts. OU q8h.  Has been religious about taking meds, but has not had any improvement.  Eyes now "matting" for past 3 days.  Some blurring, tearing, and irritation.  Preventive Screening-Counseling & Management  Alcohol-Tobacco     Alcohol drinks/day: 1 bottle per week     Alcohol type: wine     Smoking Status: quit < 6 months     Smoking Cessation Counseling: yes     Smoke Cessation Stage: quit     Packs/Day: <0.25     Passive Smoke Exposure: yes  Caffeine-Diet-Exercise     Caffeine use/day: 2 cups of coffee per morning     Does Patient Exercise: yes     Type of exercise: active in yard work     Exercise (avg: min/session): >60     Times/week: 5  Hep-HIV-STD-Contraception     HIV Risk: no     HIV Risk Counseling: not indicated-no HIV risk noted  Safety-Violence-Falls     Seat Belt Use: yes      Sexual History:  currently monogamous.        Drug Use:  never.     Current Allergies (reviewed today): No known allergies  Review of Systems General:  Denies chills, fatigue, fever, loss of appetite, malaise, sleep disorder, sweats, weakness, and weight loss. Eyes:  Complains of blurring, discharge, eye irritation, and itching. ENT:  Denies decreased hearing, difficulty swallowing, ear discharge, earache, hoarseness, nasal congestion, nosebleeds, postnasal drainage, ringing in ears, sinus pressure, and sore throat. Resp:  Denies chest discomfort, chest pain with inspiration, cough, coughing up blood, excessive snoring, hypersomnolence, morning headaches, pleuritic, shortness of breath, sputum productive, and wheezing.  Vital Signs:  Patient profile:   50 year old  male Height:      72 inches (182.88 cm) Weight:      218.8 pounds (99.45 kg) BMI:     29.78 Temp:     98.4 degrees F (36.89 degrees C) oral Pulse rate:   63 / minute BP sitting:   155 / 98  (right arm)  Vitals Entered By: Wendall Mola CMA Duncan Dull) (July 28, 2010 2:16 PM) CC: pt. c/o right eye redness, seen at urgent care dx conjunctivitis and given Gentamicin with no improvement Is Patient Diabetic? No Pain Assessment Patient in pain? no      Nutritional Status BMI of 25 - 29 = overweight Nutritional Status Detail appetite "good"  Have you ever been in a relationship where you felt threatened, hurt or afraid?No   Does patient need assistance? Functional Status Self care Ambulation Normal Comments no missed doses of meds per pt.   Physical Exam  General:  Well-developed,well-nourished,in no acute distress; alert,appropriate and cooperative throughout examination Head:  Normocephalic and atraumatic without obvious abnormalities. No apparent alopecia or balding. Eyes:  vision grossly intact, pupils equal, pupils round, pupils reactive to light, pupils react to accomodation, no iris abnormalities, no optic disk abnormalities, no retinal abnormalitiies, no nystagmus, normal cup-disc ratio, conjunctival injection, and excessive tearing.   Ears:  External ear exam shows no significant lesions or deformities.  Otoscopic examination reveals clear canals, tympanic  membranes are intact bilaterally without bulging, retraction, inflammation or discharge. Hearing is grossly normal bilaterally. Nose:  External nasal examination shows no deformity or inflammation. Nasal mucosa are pink and moist without lesions or exudates. Mouth:  Oral mucosa and oropharynx without lesions or exudates.  Teeth in good repair.   Impression & Recommendations:  Problem # 1:  UNSPECIFIED ACUTE CONJUNCTIVITIS (ICD-372.00)  Non responsive to treatment.  Due to length of time symptoms present and length of  treatment, we will refer him to ophthomology for further eval and treatment.  Uncertain of etiology, but could be viral, allergic, or both.  Instructed to continue eye gtts until seen by ophtho.  Orders: Est. Patient Level III (16109) Ophthalmology Referral (Ophthalmology)

## 2010-08-26 NOTE — Progress Notes (Signed)
Summary: "Pink eye" worse after using antibiotic drops  Phone Note Call from Patient Call back at Home Phone [phone]   Caller: Patient Reason for Call: Acute Illness Action Taken: Phone Call Completed, Appt Scheduled Today Details of Action Taken: 1400 appt. w/ B. Farringtion, NP Summary of Call: Seen last week at [facility].  Diagnosed with "pink eye" and started on three times a day antibiotics drops.  Eyes feel "worse" according to pt.  Requesting appt.  Jennet Maduro RN  July 28, 2010 10:17 AM

## 2010-08-26 NOTE — Miscellaneous (Signed)
  Clinical Lists Changes  Medications: Added new medication of GENTAMICIN SULFATE 0.3 % SOLN (GENTAMICIN SULFATE) 2 drops both eyes every 8 hours for 5-7 days

## 2010-10-06 LAB — T-HELPER CELL (CD4) - (RCID CLINIC ONLY)
CD4 % Helper T Cell: 25 % — ABNORMAL LOW (ref 33–55)
CD4 T Cell Abs: 400 uL (ref 400–2700)

## 2010-10-18 LAB — T-HELPER CELL (CD4) - (RCID CLINIC ONLY): CD4 % Helper T Cell: 26 % — ABNORMAL LOW (ref 33–55)

## 2010-10-27 LAB — T-HELPER CELL (CD4) - (RCID CLINIC ONLY): CD4 T Cell Abs: 400 uL (ref 400–2700)

## 2010-11-02 LAB — T-HELPER CELL (CD4) - (RCID CLINIC ONLY): CD4 % Helper T Cell: 26 % — ABNORMAL LOW (ref 33–55)

## 2010-12-21 ENCOUNTER — Other Ambulatory Visit (INDEPENDENT_AMBULATORY_CARE_PROVIDER_SITE_OTHER): Payer: BC Managed Care – PPO

## 2010-12-21 DIAGNOSIS — B20 Human immunodeficiency virus [HIV] disease: Secondary | ICD-10-CM

## 2010-12-21 DIAGNOSIS — Z79899 Other long term (current) drug therapy: Secondary | ICD-10-CM

## 2010-12-21 DIAGNOSIS — Z113 Encounter for screening for infections with a predominantly sexual mode of transmission: Secondary | ICD-10-CM

## 2010-12-22 LAB — COMPLETE METABOLIC PANEL WITH GFR
AST: 33 U/L (ref 0–37)
Alkaline Phosphatase: 54 U/L (ref 39–117)
BUN: 16 mg/dL (ref 6–23)
GFR, Est Non African American: >60 mL/min (ref >60)
Glucose, Bld: 93 mg/dL (ref 70–99)
Potassium: 4.8 mEq/L (ref 3.5–5.3)
Sodium: 141 mEq/L (ref 135–145)
Total Bilirubin: 0.4 mg/dL (ref 0.3–1.2)
Total Protein: 7.1 g/dL (ref 6.0–8.3)

## 2010-12-22 LAB — CBC WITH DIFFERENTIAL/PLATELET
Eosinophils Relative: 2 % (ref 0–5)
HCT: 43.1 % (ref 39.0–52.0)
Hemoglobin: 14.3 g/dL (ref 13.0–17.0)
Lymphocytes Relative: 35 % (ref 12–46)
MCV: 99.1 fL (ref 78.0–100.0)
Monocytes Absolute: 0.4 10*3/uL (ref 0.1–1.0)
Monocytes Relative: 8 % (ref 3–12)
Neutro Abs: 2.7 10*3/uL (ref 1.7–7.7)
WBC: 4.9 10*3/uL (ref 4.0–10.5)

## 2010-12-22 LAB — LIPID PANEL
Cholesterol: 198 mg/dL (ref 0–200)
LDL Cholesterol: 120 mg/dL — ABNORMAL HIGH (ref 0–99)
Total CHOL/HDL Ratio: 3.4 Ratio
Triglycerides: 102 mg/dL (ref <150)
VLDL: 20 mg/dL (ref 0–40)

## 2010-12-22 LAB — HIV-1 RNA QUANT-NO REFLEX-BLD: HIV 1 RNA Quant: <20 copies/mL (ref <20)

## 2010-12-23 LAB — T-HELPER CELL (CD4) - (RCID CLINIC ONLY): CD4 % Helper T Cell: 31 % — ABNORMAL LOW (ref 33–55)

## 2011-01-04 ENCOUNTER — Encounter: Payer: Self-pay | Admitting: Internal Medicine

## 2011-01-04 ENCOUNTER — Ambulatory Visit (INDEPENDENT_AMBULATORY_CARE_PROVIDER_SITE_OTHER): Payer: BC Managed Care – PPO | Admitting: Internal Medicine

## 2011-01-04 DIAGNOSIS — F17200 Nicotine dependence, unspecified, uncomplicated: Secondary | ICD-10-CM

## 2011-01-04 DIAGNOSIS — IMO0002 Reserved for concepts with insufficient information to code with codable children: Secondary | ICD-10-CM

## 2011-01-04 DIAGNOSIS — B20 Human immunodeficiency virus [HIV] disease: Secondary | ICD-10-CM

## 2011-01-04 NOTE — Assessment & Plan Note (Signed)
His blood pressure is up even more today. I talked to him again about dietary and lifestyle modifications and asked him to buy a home blood pressure monitor. I will see him back in 3 months for recheck.

## 2011-01-04 NOTE — Patient Instructions (Signed)
Please call the [place] cigarettes quit line and develop a plan to completely quit smoking. Also I would like you to by a home blood pressure monitor and check and record her blood pressure daily.

## 2011-01-04 NOTE — Assessment & Plan Note (Signed)
He is still smoking cigarettes socially but not on a daily basis. He does not have a plan to quit completely. I talked to him about this today and gave him the number for the quit line.

## 2011-01-04 NOTE — Progress Notes (Signed)
  Subjective:    Patient ID: Anthony Steele, male    DOB: [birth year], 50 y.o.   MRN: [id number]  HPI Anthony Steele is in for his routine visit. He denies missing any doses of his Atripla since his last visit. He says that he does try to be aware of his diet and is trying to avoid salt and eat more fruits and vegetables.    Review of Systems     Objective:   Physical Exam  Constitutional: No distress.       He has gained 2-1/2 pounds since his visit last fall.  HENT:  Mouth/Throat: Oropharynx is clear and moist. No oropharyngeal exudate.  Cardiovascular: Normal rate, regular rhythm and normal heart sounds.   Pulmonary/Chest: Breath sounds normal. He has no wheezes. He has no rales.  Psychiatric: He has a normal mood and affect.          Assessment & Plan:

## 2011-01-04 NOTE — Assessment & Plan Note (Signed)
His CD4 count is up over 500 in his viral load is undetectable at less than 20. I will continue Atripla.

## 2011-02-02 ENCOUNTER — Other Ambulatory Visit: Payer: Self-pay | Admitting: *Deleted

## 2011-02-02 DIAGNOSIS — B20 Human immunodeficiency virus [HIV] disease: Secondary | ICD-10-CM

## 2011-02-02 MED ORDER — EFAVIRENZ-EMTRICITAB-TENOFOVIR 600-200-300 MG PO TABS: 1.0000 | ORAL_TABLET | Freq: Every day | ORAL | Status: DC

## 2011-04-05 ENCOUNTER — Other Ambulatory Visit (INDEPENDENT_AMBULATORY_CARE_PROVIDER_SITE_OTHER): Payer: BC Managed Care – PPO

## 2011-04-05 DIAGNOSIS — B20 Human immunodeficiency virus [HIV] disease: Secondary | ICD-10-CM

## 2011-04-05 LAB — CBC WITH DIFFERENTIAL/PLATELET
Basophils Absolute: 0 10*3/uL (ref 0.0–0.1)
Basophils Relative: 1 % (ref 0–1)
Eosinophils Absolute: 0.1 10*3/uL (ref 0.0–0.7)
Eosinophils Relative: 2 % (ref 0–5)
HCT: 44.1 % (ref 39.0–52.0)
MCHC: 33.3 g/dL (ref 30.0–36.0)
MCV: 100.7 fL — ABNORMAL HIGH (ref 78.0–100.0)
Monocytes Absolute: 0.3 10*3/uL (ref 0.1–1.0)
Platelets: 143 10*3/uL — ABNORMAL LOW (ref 150–400)
RDW: 12.9 % (ref 11.5–15.5)

## 2011-04-06 LAB — COMPLETE METABOLIC PANEL WITH GFR
ALT: 26 U/L (ref 0–53)
Albumin: 4.4 g/dL (ref 3.5–5.2)
CO2: 23 mEq/L (ref 19–32)
Chloride: 103 mEq/L (ref 96–112)
GFR, Est African American: >60 mL/min (ref >60)
GFR, Est Non African American: >60 mL/min (ref >60)
Glucose, Bld: 106 mg/dL — ABNORMAL HIGH (ref 70–99)
Potassium: 4.6 mEq/L (ref 3.5–5.3)
Sodium: 139 mEq/L (ref 135–145)
Total Protein: 7.1 g/dL (ref 6.0–8.3)

## 2011-04-06 LAB — T-HELPER CELL (CD4) - (RCID CLINIC ONLY): CD4 T Cell Abs: 400 uL (ref 400–2700)

## 2011-04-15 LAB — CBC
HCT: 43.7
Hemoglobin: 15.2
Platelets: 154
RBC: 4.58
WBC: 9.6

## 2011-04-15 LAB — URINALYSIS, ROUTINE W REFLEX MICROSCOPIC
Bilirubin Urine: NEGATIVE
Glucose, UA: NEGATIVE
Ketones, ur: NEGATIVE
pH: 6

## 2011-04-15 LAB — I-STAT 8, (EC8 V) (CONVERTED LAB)
BUN: 16
Bicarbonate: 25.6 — ABNORMAL HIGH
Chloride: 109
HCT: 46
Hemoglobin: 15.6
Operator id: 272551
pCO2, Ven: 44.6 — ABNORMAL LOW
pH, Ven: 7.367 — ABNORMAL HIGH

## 2011-04-15 LAB — DIFFERENTIAL
Eosinophils Relative: 1
Lymphocytes Relative: 21
Lymphs Abs: 2
Monocytes Absolute: 0.5
Monocytes Relative: 5

## 2011-04-15 LAB — URINE MICROSCOPIC-ADD ON

## 2011-04-15 LAB — POCT I-STAT CREATININE: Operator id: 272551

## 2011-04-19 ENCOUNTER — Encounter: Payer: Self-pay | Admitting: Internal Medicine

## 2011-04-19 ENCOUNTER — Ambulatory Visit (INDEPENDENT_AMBULATORY_CARE_PROVIDER_SITE_OTHER): Payer: BC Managed Care – PPO | Admitting: Internal Medicine

## 2011-04-19 VITALS — BP 158/99 | HR 77 | Temp 99.4°F | Ht 72.0 in | Wt 219.0 lb

## 2011-04-19 DIAGNOSIS — Z23 Encounter for immunization: Secondary | ICD-10-CM

## 2011-04-19 DIAGNOSIS — IMO0002 Reserved for concepts with insufficient information to code with codable children: Secondary | ICD-10-CM

## 2011-04-19 DIAGNOSIS — R42 Dizziness and giddiness: Secondary | ICD-10-CM

## 2011-04-19 DIAGNOSIS — B20 Human immunodeficiency virus [HIV] disease: Secondary | ICD-10-CM

## 2011-04-19 NOTE — Progress Notes (Signed)
  Subjective:    Patient ID: Anthony Steele, male    DOB: Dec 20, 1960, 50 y.o.   MRN: [id number]  HPI Anthony Steele is in for his routine visit. He does not recall missing a single dose of his Atripla since his last visit. 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 He will be working Monday through Friday 9-5 it will allow him to get back to the gym and restart his boot camp classes. He has cut out added salt in his diet and has not had a cigarette in 12 days.  Over the past 6 weeks he has had about 4 episodes where he felt slightly dizzy while standing. He has not had any fainting spells. He does not know of any triggers. He spell lasts about 3-4 minutes and resolve spontaneously. He has not had any nausea, vomiting, chest pain, pressure or shortness of breath.  His also had 3-4 episodes of shooting, stabbing pain in his feet most commonly in his right foot but once in his left. Again he did not know of any triggers.    Review of Systems     Objective:   Physical Exam  Constitutional: No distress.       He is still overweight with a BMI nearly 30.  HENT:  Mouth/Throat: Oropharynx is clear and moist. No oropharyngeal exudate.  Eyes: Conjunctivae are normal.  Cardiovascular: Normal rate, regular rhythm and normal heart sounds.   No murmur heard. Pulmonary/Chest: Breath sounds normal.  Musculoskeletal:       He has normal pulses in his feet with intact sensation.  Psychiatric: He has a normal mood and affect.          Assessment & Plan:

## 2011-04-19 NOTE — Assessment & Plan Note (Signed)
His HIV infection remains under excellent control with a CD4 count of 400 and her viral load is undetectable at less than 20. I will continue his current regimen of Atripla.

## 2011-04-19 NOTE — Assessment & Plan Note (Signed)
I do not know the cause of his intermittent dizziness. I've instructed him on how to take his pulse and asked him to do this if he has further spells and contact me if he notes an unusually slow or rapid pulse or any irregularity.

## 2011-04-19 NOTE — Assessment & Plan Note (Signed)
His blood pressure remains elevated despite some significant lifestyle modifications. He wants to give one further try with exercise and weig starting antihypertensive medication.ht loss before

## 2011-04-21 LAB — T-HELPER CELL (CD4) - (RCID CLINIC ONLY): CD4 % Helper T Cell: 24 — ABNORMAL LOW

## 2011-04-27 LAB — T-HELPER CELL (CD4) - (RCID CLINIC ONLY): CD4 T Cell Abs: 410

## 2011-05-05 LAB — T-HELPER CELL (CD4) - (RCID CLINIC ONLY)
CD4 % Helper T Cell: 24 — ABNORMAL LOW
CD4 T Cell Abs: 420

## 2011-08-29 ENCOUNTER — Other Ambulatory Visit: Payer: BC Managed Care – PPO

## 2011-08-29 DIAGNOSIS — B20 Human immunodeficiency virus [HIV] disease: Secondary | ICD-10-CM

## 2011-08-30 LAB — COMPLETE METABOLIC PANEL WITH GFR
ALT: 30 U/L (ref 0–53)
AST: 30 U/L (ref 0–37)
Alkaline Phosphatase: 58 U/L (ref 39–117)
Creat: 0.99 mg/dL (ref 0.50–1.35)
GFR, Est African American: >89 mL/min
Sodium: 141 mEq/L (ref 135–145)
Total Bilirubin: 0.5 mg/dL (ref 0.3–1.2)
Total Protein: 7.3 g/dL (ref 6.0–8.3)

## 2011-08-30 LAB — CBC
MCH: 33.6 pg (ref 26.0–34.0)
MCV: 96.2 fL (ref 78.0–100.0)
Platelets: 153 10*3/uL (ref 150–400)
RBC: 4.47 MIL/uL (ref 4.22–5.81)
RDW: 13.1 % (ref 11.5–15.5)

## 2011-08-30 LAB — LIPID PANEL
HDL: 46 mg/dL (ref >39)
LDL Cholesterol: 144 mg/dL — ABNORMAL HIGH (ref 0–99)
Total CHOL/HDL Ratio: 4.7 Ratio
Triglycerides: 133 mg/dL (ref <150)
VLDL: 27 mg/dL (ref 0–40)

## 2011-08-30 LAB — RPR

## 2011-08-31 LAB — HIV-1 RNA QUANT-NO REFLEX-BLD: HIV 1 RNA Quant: <20 copies/mL (ref <20)

## 2011-09-12 ENCOUNTER — Encounter: Payer: Self-pay | Admitting: Internal Medicine

## 2011-09-12 ENCOUNTER — Ambulatory Visit (INDEPENDENT_AMBULATORY_CARE_PROVIDER_SITE_OTHER): Payer: BC Managed Care – PPO | Admitting: Internal Medicine

## 2011-09-12 VITALS — BP 165/100 | HR 62 | Temp 97.8°F | Ht 72.0 in | Wt 225.2 lb

## 2011-09-12 DIAGNOSIS — E669 Obesity, unspecified: Secondary | ICD-10-CM | POA: Insufficient documentation

## 2011-09-12 DIAGNOSIS — IMO0002 Reserved for concepts with insufficient information to code with codable children: Secondary | ICD-10-CM

## 2011-09-12 DIAGNOSIS — Z23 Encounter for immunization: Secondary | ICD-10-CM

## 2011-09-12 DIAGNOSIS — B20 Human immunodeficiency virus [HIV] disease: Secondary | ICD-10-CM

## 2011-09-12 HISTORY — DX: Obesity, unspecified: E66.9

## 2011-09-12 MED ORDER — LISINOPRIL 10 MG PO TABS: 10.0000 mg | ORAL_TABLET | Freq: Every day | ORAL | Status: DC

## 2011-09-12 NOTE — Progress Notes (Signed)
Patient ID: Anthony Steele, male   DOB: May 25, 1961, 51 y.o.   MRN: [id number]  INFECTIOUS DISEASE PROGRESS NOTE    Subjective: Anthony Steele is in for his routine visit. He is enjoying his work and has not had any new problems since his last visit. He does not recall missing any of his Atripla. Unfortunately he has continued to smoke cigarettes and has not returned to the gym to get any regular exercise.  Objective: Temp: 97.8 F (36.6 C) (02/18 1521) Temp src: Oral (02/18 1521) BP: 165/100 mmHg (02/18 1523) Pulse Rate: 62  (02/18 1523)  General: His BMI is now above 30 at 30.55 Skin: No rash Lungs: Clear Cor: Regular S1 and S2 no murmurs Abdomen: Soft, obese nontender   Lab Results HIV 1 RNA Quant (copies/mL)  Date Value  08/29/2011 <20   04/05/2011 <20   12/21/2010 <20      CD4 T Cell Abs (cmm)  Date Value  08/29/2011 700   04/05/2011 400   12/21/2010 560      Assessment: Anthony Steele's HIV remains under excellent control. I will continue Atripla.  His weight and blood pressure continued to go up. I will start him on lisinopril today and have encouraged him to continue to try lifestyle modification to address his blood pressure and weight gain.  Plan: 1. Continue Atripla 2. Start lisinopril 10 mg daily 3. Return to clinic for blood pressure check and lab work in 3 weeks   Cliffton Asters, MD [facility] for Infectious Diseases [facility] [phone] pager   [phone] cell 09/12/2011, 3:47 PM

## 2011-10-04 ENCOUNTER — Encounter: Payer: Self-pay | Admitting: Internal Medicine

## 2011-10-04 ENCOUNTER — Ambulatory Visit (INDEPENDENT_AMBULATORY_CARE_PROVIDER_SITE_OTHER): Payer: BC Managed Care – PPO | Admitting: Internal Medicine

## 2011-10-04 VITALS — BP 154/110 | HR 60 | Temp 98.6°F | Wt 227.0 lb

## 2011-10-04 DIAGNOSIS — IMO0002 Reserved for concepts with insufficient information to code with codable children: Secondary | ICD-10-CM

## 2011-10-04 LAB — BASIC METABOLIC PANEL
BUN: 17 mg/dL (ref 6–23)
CO2: 25 mEq/L (ref 19–32)
Calcium: 9.5 mg/dL (ref 8.4–10.5)
Glucose, Bld: 95 mg/dL (ref 70–99)
Sodium: 140 mEq/L (ref 135–145)

## 2011-10-04 MED ORDER — LISINOPRIL 10 MG PO TABS: 20.0000 mg | ORAL_TABLET | Freq: Every day | ORAL | Status: DC

## 2011-10-04 NOTE — Progress Notes (Signed)
Patient ID: Anthony Steele, male   DOB: [birth year], 51 y.o.   MRN: [id number]  INFECTIOUS DISEASE PROGRESS NOTE    Subjective: [name] is tolerating lisinopril. He is reducing salt in his diet and exercising more.  Objective: Temp: 98.6 F (37 C) (03/12 0918) Temp src: Oral (03/12 0918) BP: 154/110 mmHg (03/12 0918) Pulse Rate: 60  (03/12 0918)  General: appears well   Lab Results HIV 1 RNA Quant (copies/mL)  Date Value  08/29/2011 <20   04/05/2011 <20   12/21/2010 <20      CD4 T Cell Abs (cmm)  Date Value  08/29/2011 700   04/05/2011 400   12/21/2010 560      Assessment: BP is not at goal. I will check a BMP and increase lisinopril.  Plan: 1. BMP 2. Increase lisinopril to 20 mg daily 3. Return in one month   Cliffton Asters, MD [facility] for Infectious Diseases [facility] [phone] pager   [phone] cell 10/04/2011, 9:32 AM

## 2011-10-05 ENCOUNTER — Other Ambulatory Visit: Payer: Self-pay | Admitting: *Deleted

## 2011-10-05 ENCOUNTER — Telehealth: Payer: Self-pay | Admitting: *Deleted

## 2011-10-05 DIAGNOSIS — IMO0002 Reserved for concepts with insufficient information to code with codable children: Secondary | ICD-10-CM

## 2011-10-05 MED ORDER — LISINOPRIL 10 MG PO TABS: 20.0000 mg | ORAL_TABLET | Freq: Every day | ORAL | Status: DC

## 2011-10-05 MED ORDER — LISINOPRIL 20 MG PO TABS: 20.0000 mg | ORAL_TABLET | Freq: Every day | ORAL | Status: DC

## 2011-10-05 NOTE — Telephone Encounter (Signed)
He thought he heard md say that he was going to order him a different, stronger bp med. I read him what md wrote-lisinopril 10 take 2 a day.. I had already gone in & ordered 180 for him to take 2 a day. I told him I will send a message to md & ask about this. I will call him back at [phone]

## 2011-10-05 NOTE — Telephone Encounter (Signed)
I meant to refill the new script as Lisinopril 20 mg once daily.

## 2011-10-06 ENCOUNTER — Telehealth: Payer: Self-pay | Admitting: *Deleted

## 2011-10-06 NOTE — Telephone Encounter (Signed)
I left a message on his home number that the next fill will be the 20mg  tab-same med. To finish 10mg  tabs(take 2 a day). With next refill it will be a 20mg  tab & he is to take 1

## 2011-11-07 ENCOUNTER — Ambulatory Visit (INDEPENDENT_AMBULATORY_CARE_PROVIDER_SITE_OTHER): Payer: BC Managed Care – PPO | Admitting: Internal Medicine

## 2011-11-07 VITALS — BP 139/95 | HR 69 | Temp 97.7°F | Wt 222.0 lb

## 2011-11-07 DIAGNOSIS — B20 Human immunodeficiency virus [HIV] disease: Secondary | ICD-10-CM

## 2011-11-07 NOTE — Progress Notes (Signed)
Patient ID: Anthony Steele, male   DOB: [birth year], 51 y.o.   MRN: [id number]  INFECTIOUS DISEASE PROGRESS NOTE    Subjective: Anthony Steele is in for his routine visit. He is tolerating his increased dose of lisinopril. He never misses his Atripla. He is trying she healthy and has been able to lose 5 pounds. He has not been getting regular exercise but has looked into joining a new gym and restarting group classes. He continues to smoke a few cigarettes daily.  Objective: Temp: 97.7 F (36.5 C) (04/15 1454) Temp src: Oral (04/15 1454) BP: 139/95 mmHg (04/15 1454) Pulse Rate: 69  (04/15 1454)  General: He is in no distress. His weight is down 5 pounds but his BMI remains just slightly above 30 Skin: No rash Lungs: Clear Cor: Regular S1 and S2 and no murmurs  Lab Results HIV 1 RNA Quant (copies/mL)  Date Value  08/29/2011 <20   04/05/2011 <20   12/21/2010 <20      CD4 T Cell Abs (cmm)  Date Value  08/29/2011 700   04/05/2011 400   12/21/2010 560      Assessment: His blood pressure and weight have improved but are not at goal. I encouraged him to join the gym and get regular exercise. I also talked to him about cigarette cessation.   Plan: 1. Continue Atripla 2. Continue lisinopril 3. Continue to work on lifestyle modification and cigarette cessation 4. Followup after lab work in 3 months   Cliffton Asters, MD [facility] for Infectious Diseases [facility] [phone] pager   [phone] cell 11/07/2011, 3:07 PM

## 2011-11-10 ENCOUNTER — Other Ambulatory Visit: Payer: Self-pay | Admitting: *Deleted

## 2011-11-10 DIAGNOSIS — IMO0002 Reserved for concepts with insufficient information to code with codable children: Secondary | ICD-10-CM

## 2011-11-10 MED ORDER — LISINOPRIL 20 MG PO TABS: 20.0000 mg | ORAL_TABLET | Freq: Every day | ORAL | Status: DC

## 2011-11-10 NOTE — Telephone Encounter (Signed)
Pt told me he has been changed to a higher dose & takes it twice a day. I sent in a one month rx to a local [facility] as he is out. I called him back & asked if it is 20 mg bid as this sound high.  He states it is 10 mg bid. I told him md had changed it to 20 mg once a day. He was pleased that he will only have to take one tab a day. [facility] called [facility] & spoke with Ebony Cargo, the pharmacist. I asked him to disregard the 1st rx sent. Resent 20mg  once a day. Also sent a 90 day supply to his mail order pharmacy.

## 2011-12-07 ENCOUNTER — Other Ambulatory Visit: Payer: Self-pay | Admitting: *Deleted

## 2011-12-07 DIAGNOSIS — IMO0002 Reserved for concepts with insufficient information to code with codable children: Secondary | ICD-10-CM

## 2011-12-07 MED ORDER — LISINOPRIL 20 MG PO TABS: 20.0000 mg | ORAL_TABLET | Freq: Every day | ORAL | Status: DC

## 2011-12-07 NOTE — Telephone Encounter (Signed)
Pt requested that refill be sent to [facility] for 90-day refills

## 2011-12-22 ENCOUNTER — Telehealth: Payer: Self-pay | Admitting: *Deleted

## 2011-12-22 NOTE — Telephone Encounter (Signed)
Patient called and advised that he has a rash on his arms and legs that started on Saturday 12/17/11. He states that it started as whelps that have turned into blisters and he was wondering if it could be due to his lisinopril increase. Advised him not sure but since it has been a month or so not likely. Advised he is wearing longsleeve shirts as the rash is not attractive and is wondering what he should do. No new foods, laundry detergents, or bath soaps. Reports no fevers or any other physical symptoms. Advise patient will inform the provider and call him back with his response.  Best number [phone]

## 2011-12-22 NOTE — Telephone Encounter (Signed)
Please offer her Anthony Steele a visit with me on Monday, June 3 at 2:15 PM. I doubt that the rash is due to lisinopril but would like to see him before making any final assessment and recommendation.

## 2011-12-26 ENCOUNTER — Ambulatory Visit (INDEPENDENT_AMBULATORY_CARE_PROVIDER_SITE_OTHER): Payer: BC Managed Care – PPO | Admitting: Internal Medicine

## 2011-12-26 VITALS — BP 150/99 | HR 77 | Temp 98.5°F | Ht 72.0 in | Wt 223.0 lb

## 2011-12-26 DIAGNOSIS — R21 Rash and other nonspecific skin eruption: Secondary | ICD-10-CM

## 2011-12-26 NOTE — Progress Notes (Signed)
Patient ID: Anthony Steele, male   DOB: [birth year], 51 y.o.   MRN: [id number]     [facility] for Infectious Disease  Patient Active Problem List  Diagnoses  . HIV DISEASE  . GENITAL HERPES  . HEPATITIS B, CHRONIC  . VENEREAL WART  . ANXIETY DISORDER, GENERALIZED  . CIGARETTE SMOKER  . PNEUMONIA  . GERD  . NEPHROLITHIASIS  . PSORIASIS  . DEGENERATIVE DISC DISEASE  . CERVICAL RADICULOPATHY  . LOW BACK PAIN, ACUTE  . CHEST PAIN, PLEURITIC  . HYPERTENSION NEC  . UNSPECIFIED ACUTE CONJUNCTIVITIS  . Dizziness  . Obesity  . Rash    Patient's Medications  New Prescriptions   No medications on file  Previous Medications   ASPIRIN 81 MG TABLET    Take 81 mg by mouth daily.   EFAVIRENZ-EMTRICTABINE-TENOFOVIR (ATRIPLA) 600-200-300 MG PER TABLET    Take 1 tablet by mouth daily.   LISINOPRIL (PRINIVIL,ZESTRIL) 20 MG TABLET    Take 1 tablet (20 mg total) by mouth daily.   OMEPRAZOLE (PRILOSEC) 10 MG CAPSULE    Take 10 mg by mouth daily.   VALACYCLOVIR (VALTREX) 500 MG TABLET    Take 500 mg by mouth 2 (two) times daily as needed.    Modified Medications   No medications on file  Discontinued Medications   No medications on file    Subjective: Anthony Steele is in for a work in visit. He developed a nonpruritic rash on his arms and legs about 10 days ago shortly after pruning some ivy in his garden. He was concerned it might be related to his lisinopril which had been increased from 10 to 20 mg recently. He is continued on his same medications without any change in his rash has slowly improved over the past week. He has been using some topical cortisone cream.  Objective: Temp: 98.5 F (36.9 C) (06/03 1417) Temp src: Oral (06/03 1417) BP: 150/99 mmHg (06/03 1417) Pulse Rate: 77  (06/03 1417)  General: He is in no distress Skin: He has a maculopapular rash on his forearms and shins. He does not think he has any new lesions over the past several days. There is sparing of his palms and  soles Oral: Clear Eyes: Normal  Lab Results HIV 1 RNA Quant (copies/mL)  Date Value  08/29/2011 <20   04/05/2011 <20   12/21/2010 <20      CD4 T Cell Abs (cmm)  Date Value  08/29/2011 700   04/05/2011 400   12/21/2010 560      Assessment: Him not sure what is causing his rash but it seems to be resolving spontaneously. I doubt that it is related to his medications and we agreed to simply follow for the time being and see if it resolves.  Plan: 1. Continue current medications 2.  Followup in about 3 weeks   Anthony Asters, MD [facility] for Infectious Disease [facility] [phone] pager   [phone] cell 12/26/2011, 2:44 PM

## 2012-01-05 ENCOUNTER — Other Ambulatory Visit: Payer: Self-pay | Admitting: Dermatology

## 2012-01-11 ENCOUNTER — Other Ambulatory Visit: Payer: Self-pay | Admitting: *Deleted

## 2012-01-11 DIAGNOSIS — B009 Herpesviral infection, unspecified: Secondary | ICD-10-CM

## 2012-01-11 MED ORDER — VALACYCLOVIR HCL 500 MG PO TABS: 500.0000 mg | ORAL_TABLET | Freq: Two times a day (BID) | ORAL | Status: DC | PRN

## 2012-01-16 ENCOUNTER — Ambulatory Visit: Payer: BC Managed Care – PPO | Admitting: Internal Medicine

## 2012-02-13 ENCOUNTER — Telehealth: Payer: Self-pay

## 2012-02-13 ENCOUNTER — Ambulatory Visit (INDEPENDENT_AMBULATORY_CARE_PROVIDER_SITE_OTHER): Payer: BC Managed Care – PPO | Admitting: Internal Medicine

## 2012-02-13 ENCOUNTER — Encounter: Payer: Self-pay | Admitting: Internal Medicine

## 2012-02-13 VITALS — BP 141/96 | HR 83 | Temp 98.2°F | Ht 72.0 in | Wt 221.0 lb

## 2012-02-13 DIAGNOSIS — R21 Rash and other nonspecific skin eruption: Secondary | ICD-10-CM

## 2012-02-13 NOTE — Patient Instructions (Signed)
Stop your lisinopril. 

## 2012-02-13 NOTE — Telephone Encounter (Signed)
Pt c/o continued rash . He has seen Dr. Nicholas Lose stated the rash is related to medications but did not say which one.  Pt says rash worsened when he increased Lisinopril to 20 mg daily which has been at least 3 weeks now.  Most recent concern is the spread of rash with blisters to upper and lower body which includes palms of hands and soles of feet.    Office visit for evaluation given.   Laurell Josephs, RN

## 2012-02-14 LAB — COMPREHENSIVE METABOLIC PANEL
AST: 32 U/L (ref 0–37)
BUN: 15 mg/dL (ref 6–23)
Calcium: 9.8 mg/dL (ref 8.4–10.5)
Chloride: 103 mEq/L (ref 96–112)
Creat: 0.97 mg/dL (ref 0.50–1.35)
Total Bilirubin: 0.4 mg/dL (ref 0.3–1.2)

## 2012-02-14 LAB — HEPATITIS C ANTIBODY: HCV Ab: NEGATIVE

## 2012-02-14 NOTE — Assessment & Plan Note (Signed)
This has been a persistent rash. With the palmer involvement, I did check an RPR which is negative.  No signs of [place] spotted fever with no fever or headache and this has been persistent for 6 weeks. With the reported biopsy consistent with a drug allergy, lisinopril is the possible culprit. I will have him stop the lisinopril and return in one week. At that time I will consider changing his blood pressure medication.

## 2012-02-14 NOTE — Progress Notes (Signed)
  Subjective:    Patient ID: Anthony Steele, male    DOB: [birth year], 51 y.o.   MRN: [id number]  HPI He comes in with a complaint of a persistent rash. He first saw his primary provider about 6 weeks ago for this rash and no etiology identified. He subsequently went to a dermatologist and had a biopsy of part of his rash on his left thigh which he was told was consistent with a drug allergy. His rash has persisted and has included some sloughing and some erythema. There has been some palmar and foot involvement. No fever or chills. His only medication change has been in the increased dose of his lisinopril preceding the rash.   Review of Systems  Constitutional: Negative for fever, chills and fatigue.  Musculoskeletal: Negative for myalgias, joint swelling and arthralgias.  Skin: Positive for rash.       Objective:   Physical Exam  Constitutional: He appears well-developed and well-nourished. No distress.  Skin:       A macular papular rash. It is on his extremities, palms and trunk.          Assessment & Plan:

## 2012-02-22 ENCOUNTER — Ambulatory Visit (INDEPENDENT_AMBULATORY_CARE_PROVIDER_SITE_OTHER): Payer: BC Managed Care – PPO | Admitting: Internal Medicine

## 2012-02-22 ENCOUNTER — Encounter: Payer: Self-pay | Admitting: Internal Medicine

## 2012-02-22 VITALS — BP 149/91 | HR 76 | Temp 98.0°F | Ht 72.0 in | Wt 223.0 lb

## 2012-02-22 DIAGNOSIS — R21 Rash and other nonspecific skin eruption: Secondary | ICD-10-CM

## 2012-02-22 DIAGNOSIS — IMO0002 Reserved for concepts with insufficient information to code with codable children: Secondary | ICD-10-CM

## 2012-02-22 NOTE — Assessment & Plan Note (Signed)
He continues off of his lisinopril. If diagnosis is unsure or if it is lichen planus, he can be changed to another blood pressure medication. He does have a follow up appointment with his primary provider coming up this can be addressed at that time. If an alternative diagnosis is found, he may restart his lisinopril.

## 2012-02-22 NOTE — Progress Notes (Signed)
  Subjective:    Patient ID: Anthony Steele, male    DOB: Mar 06, 1961, 51 y.o.   MRN: [id number]  HPI He comes in here for his followup of his rash. He was seen one week ago after seeing a dermatologist who suggested by the biopsy results that it is likely secondary to a drug allergy. In review of his medication list, all his medications can cause a rash though the only change recently was of the lisinopril which was increased from 10-20 mg. All his other medications had been chronic for a long time. He has tried in the past topical steroids both over-the-counter and prescription with no improvement. He has had some spread of his rash since last week and some sloughing. It is not overly pruritic or painful. It does involve the palms and soles of his feet though is on his legs and trunk as well.  No fever or chills associated with this or prior to presentation. No mucous membrane involvement.   Review of Systems  Constitutional: Negative for fever, chills and fatigue.  Cardiovascular: Negative for leg swelling.  Gastrointestinal: Negative for nausea, abdominal pain and diarrhea.  Musculoskeletal: Negative for myalgias, joint swelling and arthralgias.  Skin: Positive for rash.  Neurological: Negative for dizziness, facial asymmetry and headaches.  Hematological: Negative for adenopathy.       Objective:   Physical Exam  Skin:       Papillary lesions diffusely, some confluence in some areas. No significant surrounding erythema. It is present in sun exposed areas and on exposed areas.          Assessment & Plan:

## 2012-02-22 NOTE — Assessment & Plan Note (Signed)
I am unclear of what the rash is. There is one area with a plaque-like area that does suggest a lichen planus. I also note the biopsy results which show the same along with some eosinophils suggestive of drug reaction. He continues to be off of the lisinopril as this has been associated with this. I believe that it can take months to recover once off the offending drug if that is it. I did check a hepatitis C antibody which is negative. Also his RPR is negative. I did ask him to return to the dermatologist see if there other topical therapies or systemic therapies that might be indicated. Or if there is an alternative diagnosis.

## 2012-02-24 ENCOUNTER — Other Ambulatory Visit: Payer: Self-pay | Admitting: Licensed Clinical Social Worker

## 2012-02-24 DIAGNOSIS — B20 Human immunodeficiency virus [HIV] disease: Secondary | ICD-10-CM

## 2012-02-24 MED ORDER — EFAVIRENZ-EMTRICITAB-TENOFOVIR 600-200-300 MG PO TABS: 1.0000 | ORAL_TABLET | Freq: Every day | ORAL | Status: DC

## 2012-03-13 ENCOUNTER — Ambulatory Visit (INDEPENDENT_AMBULATORY_CARE_PROVIDER_SITE_OTHER): Payer: BC Managed Care – PPO | Admitting: Internal Medicine

## 2012-03-13 ENCOUNTER — Encounter: Payer: Self-pay | Admitting: Internal Medicine

## 2012-03-13 VITALS — BP 160/100 | HR 60 | Temp 98.2°F | Ht 72.0 in | Wt 222.5 lb

## 2012-03-13 DIAGNOSIS — IMO0002 Reserved for concepts with insufficient information to code with codable children: Secondary | ICD-10-CM

## 2012-03-13 DIAGNOSIS — R21 Rash and other nonspecific skin eruption: Secondary | ICD-10-CM

## 2012-03-13 DIAGNOSIS — B20 Human immunodeficiency virus [HIV] disease: Secondary | ICD-10-CM

## 2012-03-13 NOTE — Progress Notes (Signed)
Patient ID: Anthony Steele, male   DOB: [birth year], 51 y.o.   MRN: [id number]     [facility] for Infectious Disease  Patient Active Problem List  Diagnosis  . HIV DISEASE  . GENITAL HERPES  . HEPATITIS B, CHRONIC  . VENEREAL WART  . ANXIETY DISORDER, GENERALIZED  . CIGARETTE SMOKER  . PNEUMONIA  . GERD  . NEPHROLITHIASIS  . PSORIASIS  . DEGENERATIVE DISC DISEASE  . CERVICAL RADICULOPATHY  . LOW BACK PAIN, ACUTE  . CHEST PAIN, PLEURITIC  . HYPERTENSION NEC  . UNSPECIFIED ACUTE CONJUNCTIVITIS  . Dizziness  . Obesity  . Rash    Patient's Medications  New Prescriptions   No medications on file  Previous Medications   ASPIRIN 81 MG TABLET    Take 81 mg by mouth daily.   EFAVIRENZ-EMTRICITABINE-TENOFOVIR (ATRIPLA) 600-200-300 MG PER TABLET    Take 1 tablet by mouth daily.   OMEPRAZOLE (PRILOSEC) 10 MG CAPSULE    Take 10 mg by mouth daily.   VALACYCLOVIR (VALTREX) 500 MG TABLET    Take 1 tablet (500 mg total) by mouth 2 (two) times daily as needed.  Modified Medications   No medications on file  Discontinued Medications   No medications on file    Subjective: Anthony Steele is in for his routine followup visit. His rash worsened the last month and he saw my partner, Dr. Luciana Axe, who stopped his lisinopril on July 23. He had a skin biopsy by Dr. Tally Joe that showed changes compatible with a drug eruption. He was treated for a short period with topical steroid gel. He has noticed slow improvement in the rash on his hands and feet. He has not missed any doses of his Atripla or other medications. He states that he is only smoking cigarettes rarely and that they are " few and far between".  Objective: Temp: 98.2 F (36.8 C) (08/20 1542) Temp src: Oral (08/20 1542) BP: 160/100 mmHg (08/20 1542) Pulse Rate: 60  (08/20 1542)  General: He is in good spirits Skin: Erythematous papules are still prominent on his hands particularly his palms and on his lower legs Lungs: Clear Cor:  Regular S1 and S2 no murmurs Abdomen: Soft and nontender  Lab Results HIV 1 RNA Quant (copies/mL)  Date Value  08/29/2011 <20   04/05/2011 <20   12/21/2010 <20      CD4 T Cell Abs (cmm)  Date Value  08/29/2011 700   04/05/2011 400   12/21/2010 560      Assessment: I suspect that he had a drug eruption related to his lisinopril and that he is improving slowly since it's been stopped one month ago. His blood pressure is slightly elevated today but I would recommend holding off on a new anti-hypertensive drug until his rash has resolved completely. I suggested that he establish primary care to help with that discussion. He is in agreement.  His HIV infection has been well controlled with Atripla. I will repeat his CD4 count and viral load again today and have him followup with me in 6 months.  Plan: 1. Continue Atripla 2. Repeat CD4 and viral load today 3. Observe off of lisinopril 4. Establish primary care 5. Followup here after lab work in 6 months   Cliffton Asters, MD [facility] for Infectious Disease [facility] [phone] pager   [phone] cell 03/13/2012, 4:09 PM

## 2012-03-14 LAB — T-HELPER CELL (CD4) - (RCID CLINIC ONLY)
CD4 % Helper T Cell: 31 % — ABNORMAL LOW (ref 33–55)
CD4 T Cell Abs: 570 uL (ref 400–2700)

## 2012-03-15 LAB — HIV-1 RNA QUANT-NO REFLEX-BLD: HIV-1 RNA Quant, Log: <1.3 {Log} (ref <1.30)

## 2012-05-31 ENCOUNTER — Encounter: Payer: Self-pay | Admitting: Internal Medicine

## 2012-05-31 ENCOUNTER — Ambulatory Visit (INDEPENDENT_AMBULATORY_CARE_PROVIDER_SITE_OTHER): Payer: BC Managed Care – PPO | Admitting: Internal Medicine

## 2012-05-31 ENCOUNTER — Other Ambulatory Visit (INDEPENDENT_AMBULATORY_CARE_PROVIDER_SITE_OTHER): Payer: BC Managed Care – PPO

## 2012-05-31 VITALS — BP 140/90 | HR 72 | Temp 97.6°F | Ht 72.0 in | Wt 223.0 lb

## 2012-05-31 DIAGNOSIS — IMO0002 Reserved for concepts with insufficient information to code with codable children: Secondary | ICD-10-CM

## 2012-05-31 DIAGNOSIS — E785 Hyperlipidemia, unspecified: Secondary | ICD-10-CM | POA: Insufficient documentation

## 2012-05-31 DIAGNOSIS — I10 Essential (primary) hypertension: Secondary | ICD-10-CM

## 2012-05-31 DIAGNOSIS — G5601 Carpal tunnel syndrome, right upper limb: Secondary | ICD-10-CM

## 2012-05-31 DIAGNOSIS — Z0001 Encounter for general adult medical examination with abnormal findings: Secondary | ICD-10-CM | POA: Insufficient documentation

## 2012-05-31 DIAGNOSIS — Z0000 Encounter for general adult medical examination without abnormal findings: Secondary | ICD-10-CM

## 2012-05-31 DIAGNOSIS — Z23 Encounter for immunization: Secondary | ICD-10-CM

## 2012-05-31 HISTORY — DX: Carpal tunnel syndrome, right upper limb: G56.01

## 2012-05-31 LAB — PSA: PSA: 0.14 ng/mL (ref 0.10–4.00)

## 2012-05-31 MED ORDER — AMLODIPINE BESYLATE 5 MG PO TABS: 5.0000 mg | ORAL_TABLET | Freq: Every day | ORAL | Status: DC

## 2012-05-31 MED ORDER — SCOPOLAMINE 1 MG/3DAYS TD PT72: 1.0000 | MEDICATED_PATCH | TRANSDERMAL | Status: DC

## 2012-05-31 NOTE — Progress Notes (Signed)
Subjective:    Patient ID: Anthony Steele, male    DOB: [birth year], 50 y.o.   MRN: [id number]  HPI  Here for wellness and to establish as new pt;  Overall doing ok;  Pt denies CP, worsening SOB, DOE, wheezing, orthopnea, PND, worsening LE edema, palpitations, dizziness or syncope.  Pt denies neurological change such as new Headache, facial or extremity weakness.  Pt denies polydipsia, polyuria, or low sugar symptoms. Pt states overall good compliance with treatment and medications, good tolerability, and trying to follow lower cholesterol diet.  Pt denies worsening depressive symptoms, suicidal ideation or panic. No fever, wt loss, night sweats, loss of appetite, or other constitutional symptoms.  Pt states good ability with ADL's, low fall risk, home safety reviewed and adequate, no significant changes in hearing or vision, and occasionally active with exercise. Did have signficant rash determined to be ACEI related so stopped, rash resolved.   BP has been somewhat higher than today in the past yr - often in the 160/100 range, but not checked recently. Needs immunizations today, has been referred to Dr Joline Salt surgury for right CTS, moderate.  Asks for patch for avoiding dizziness on cruise coming up.   Past Medical History  Diagnosis Date  . GERD (gastroesophageal reflux disease)   . HIV infection   . Hyperlipidemia   . Hypertension   . Kidney stones   . ANXIETY DISORDER, GENERALIZED 08/22/2006    Qualifier: Diagnosis of  By: Orvan Falconer MD, Zakirah Weingart    . CERVICAL RADICULOPATHY 08/22/2006    Annotation: L arm 2002 Qualifier: Diagnosis of  By: Orvan Falconer MD, Dorise Gangi    . DEGENERATIVE DISC DISEASE 08/22/2006    Qualifier: Diagnosis of  By: Orvan Falconer MD, Tomothy Eddins    . GENITAL HERPES 08/22/2006    Qualifier: Diagnosis of  By: Orvan Falconer MD, Oren Barella    . GERD 07/28/2006    Qualifier: Diagnosis of  By: Orvan Falconer MD, Brett Darko    . HEPATITIS B, CHRONIC 07/28/2006    Qualifier: Diagnosis of  By: Orvan Falconer MD, Beatriz Quintela    . HIV DISEASE  07/28/2006    Annotation: dx 2000 Qualifier: Diagnosis of  By: Orvan Falconer MD, Breyden Jeudy    . HYPERTENSION NEC 07/07/2009    Qualifier: Diagnosis of  By: Orvan Falconer MD, Omer Monter    . NEPHROLITHIASIS 08/22/2006    Annotation: x2, last 1992 Qualifier: Diagnosis of  By: Orvan Falconer MD, Tomi Paddock    . Obesity 09/12/2011  . PSORIASIS 08/22/2006    Qualifier: Diagnosis of  By: Orvan Falconer MD, Cecia Egge    . Carpal tunnel syndrome, right 05/31/2012   History reviewed. No pertinent past surgical history.  reports that he has been smoking Cigarettes.  He has been smoking about .1 packs per day. He has never used smokeless tobacco. He reports that he drinks about 1.5 ounces of alcohol per week. He reports that he does not use illicit drugs. family history includes Arthritis in his other; Cancer in his other; Heart disease in his other; and Hypertension in his other. Allergies  Allergen Reactions  . Lisinopril Rash   Current Outpatient Prescriptions on File Prior to Visit  Medication Sig Dispense Refill  . aspirin 81 MG tablet Take 81 mg by mouth daily.      Marland Kitchen omeprazole (PRILOSEC) 10 MG capsule Take 10 mg by mouth daily.      . valACYclovir (VALTREX) 500 MG tablet Take 1 tablet (500 mg total) by mouth 2 (two) times daily as needed.  180 tablet  0  . amLODipine (  NORVASC) 5 MG tablet Take 1 tablet (5 mg total) by mouth daily.  90 tablet  3  . efavirenz-emtricitabine-tenofovir (ATRIPLA) 600-200-300 MG per tablet Take 1 tablet by mouth daily.  90 tablet  3   Review of Systems Review of Systems  Constitutional: Negative for diaphoresis, activity change, appetite change and unexpected weight change.  HENT: Negative for hearing loss, ear pain, facial swelling, mouth sores and neck stiffness.   Eyes: Negative for pain, redness and visual disturbance.  Respiratory: Negative for shortness of breath and wheezing.   Cardiovascular: Negative for chest pain and palpitations.  Gastrointestinal: Negative for diarrhea, blood in stool, abdominal  distention and rectal pain.  Genitourinary: Negative for hematuria, flank pain and decreased urine volume.  Musculoskeletal: Negative for myalgias and joint swelling.  Skin: Negative for color change and wound.  Neurological: Negative for syncope and numbness.  Hematological: Negative for adenopathy.  Psychiatric/Behavioral: Negative for hallucinations, self-injury, decreased concentration and agitation.      Objective:   Physical Exam BP 140/90  Pulse 72  Temp 97.6 F (36.4 C) (Oral)  Ht 6' (1.829 m)  Wt 223 lb (101.152 kg)  BMI 30.24 kg/m2  SpO2 96% Physical Exam  VS noted Constitutional: Pt is oriented to person, place, and time. Appears well-developed and well-nourished. Lavella Lemons HENT:  Head: Normocephalic and atraumatic.  Right Ear: External ear normal.  Left Ear: External ear normal.  Nose: Nose normal.  Mouth/Throat: Oropharynx is clear and moist.  Eyes: Conjunctivae and EOM are normal. Pupils are equal, round, and reactive to light.  Neck: Normal range of motion. Neck supple. No JVD present. No tracheal deviation present.  Cardiovascular: Normal rate, regular rhythm, normal heart sounds and intact distal pulses.   Pulmonary/Chest: Effort normal and breath sounds normal.  Abdominal: Soft. Bowel sounds are normal. There is no tenderness.  Musculoskeletal: Normal range of motion. Exhibits no edema.  Lymphadenopathy:  Has no cervical adenopathy.  Neurological: Pt is alert and oriented to person, place, and time. Pt has normal reflexes. No cranial nerve deficit.motor/gait intact  Skin: Skin is warm and dry. No rash noted. except for signficant LE venous insufficiency below knees left > right Psychiatric:  Has  normal mood and affect. Behavior is normal. mild nervous    Assessment & Plan:

## 2012-05-31 NOTE — Addendum Note (Signed)
Addended by: Scharlene Gloss B on: 05/31/2012 10:40 AM   Modules accepted: Orders

## 2012-05-31 NOTE — Assessment & Plan Note (Signed)
For lower chol diet,  to f/u any worsening symptoms or concerns Lab Results  Component Value Date   LDLCALC 144* 08/29/2011

## 2012-05-31 NOTE — Patient Instructions (Addendum)
You had the Tetanus and Flu shots today Your EKG was OK today Take all new medications as prescribed  - the amlodipine 5 mg per day for blood pressure, and the patch for sea sickness Please start checking your Blood Pressure daily for 1 week after being on the amlodipine for 3 weeks;  Please call [phone] and leave results (ok to leave on message machine); we may need to add HCTZ 12.5 mg per day in addition (a mild fluid pill) Continue all other medications as before Please have the pharmacy call with any other refills you may need. You will be contacted regarding the referral for: colonoscopy for routine screening Please keep your appointments with your specialists as you have planned - Dr Orlan Leavens for the right carpal tunnel, and the ID clinic as per routine Please go to LAB in the Basement for the blood and/or urine tests to be done today - just the PSA today You will be contacted by phone if any changes need to be made immediately.  Otherwise, you will receive a letter about your results with an explanation. Please remember to sign up for My Chart at your earliest convenience, as this will be important to you in the future with finding out test results. Please continue your efforts at being more active, low cholesterol diet, and weight control. Please return in 6 months, or sooner if needed

## 2012-05-31 NOTE — Assessment & Plan Note (Signed)
With recent drug related rash, no further ACEI, and will avoid ARB as well, ECG reviewed as per emr, for amlodpine 5 qd, advised to wait for 3 wks, then check BP daily and call with results after 1 wk, consider add hctz 12.5 if not good control

## 2012-05-31 NOTE — Assessment & Plan Note (Signed)
Overall doing well, age appropriate education and counseling updated, referrals for preventative services and immunizations addressed, dietary and smoking counseling addressed, most recent labs and ECG reviewed.  I have personally reviewed and have noted: 1) the patient's medical and social history 2) The pt's use of alcohol, tobacco, and illicit drugs 3) The patient's current medications and supplements 4) Functional ability including ADL's, fall risk, home safety risk, hearing and visual impairment 5) Diet and physical activities 6) Evidence for depression or mood disorder 7) The patient's height, weight, and BMI have been recorded in the chart I have made referrals, and provided counseling and education based on review of the above For tetanus, flu shot routine shots, ECG reviewed as per emr, also refer for colonscopy for routine screening, also for PSA only today, has had mult other routine labs in the past 6-12 mo; also for transderm scop for coming cruise ship trip

## 2012-06-04 ENCOUNTER — Other Ambulatory Visit: Payer: Self-pay | Admitting: *Deleted

## 2012-06-04 DIAGNOSIS — B20 Human immunodeficiency virus [HIV] disease: Secondary | ICD-10-CM

## 2012-06-04 MED ORDER — EFAVIRENZ-EMTRICITAB-TENOFOVIR 600-200-300 MG PO TABS: 1.0000 | ORAL_TABLET | Freq: Every day | ORAL | Status: DC

## 2012-07-03 ENCOUNTER — Other Ambulatory Visit: Payer: Self-pay | Admitting: *Deleted

## 2012-07-03 DIAGNOSIS — B009 Herpesviral infection, unspecified: Secondary | ICD-10-CM

## 2012-07-03 MED ORDER — VALACYCLOVIR HCL 500 MG PO TABS: 500.0000 mg | ORAL_TABLET | Freq: Two times a day (BID) | ORAL | Status: DC | PRN

## 2012-07-03 NOTE — Telephone Encounter (Signed)
Received a call from the patient that he needs this Rx filled.

## 2012-09-04 ENCOUNTER — Other Ambulatory Visit: Payer: BC Managed Care – PPO

## 2012-09-04 DIAGNOSIS — R21 Rash and other nonspecific skin eruption: Secondary | ICD-10-CM

## 2012-09-04 DIAGNOSIS — B20 Human immunodeficiency virus [HIV] disease: Secondary | ICD-10-CM

## 2012-09-04 DIAGNOSIS — IMO0002 Reserved for concepts with insufficient information to code with codable children: Secondary | ICD-10-CM

## 2012-09-04 LAB — CBC
HCT: 42 % (ref 39.0–52.0)
MCH: 32.3 pg (ref 26.0–34.0)
MCV: 96.3 fL (ref 78.0–100.0)
RBC: 4.36 MIL/uL (ref 4.22–5.81)
WBC: 5.4 10*3/uL (ref 4.0–10.5)

## 2012-09-05 LAB — LIPID PANEL
HDL: 50 mg/dL (ref >39)
LDL Cholesterol: 126 mg/dL — ABNORMAL HIGH (ref 0–99)
Total CHOL/HDL Ratio: 4 Ratio
Triglycerides: 121 mg/dL (ref <150)

## 2012-09-05 LAB — COMPREHENSIVE METABOLIC PANEL
Alkaline Phosphatase: 59 U/L (ref 39–117)
CO2: 28 mEq/L (ref 19–32)
Creat: 1.02 mg/dL (ref 0.50–1.35)
Glucose, Bld: 98 mg/dL (ref 70–99)
Sodium: 141 mEq/L (ref 135–145)
Total Bilirubin: 0.4 mg/dL (ref 0.3–1.2)
Total Protein: 6.9 g/dL (ref 6.0–8.3)

## 2012-09-05 LAB — T-HELPER CELL (CD4) - (RCID CLINIC ONLY)
CD4 % Helper T Cell: 30 % — ABNORMAL LOW (ref 33–55)
CD4 T Cell Abs: 660 uL (ref 400–2700)

## 2012-09-18 ENCOUNTER — Ambulatory Visit (INDEPENDENT_AMBULATORY_CARE_PROVIDER_SITE_OTHER): Payer: BC Managed Care – PPO | Admitting: Internal Medicine

## 2012-09-18 ENCOUNTER — Encounter: Payer: Self-pay | Admitting: Internal Medicine

## 2012-09-18 VITALS — BP 165/100 | HR 83 | Temp 98.4°F | Ht 72.0 in | Wt 232.0 lb

## 2012-09-18 DIAGNOSIS — L989 Disorder of the skin and subcutaneous tissue, unspecified: Secondary | ICD-10-CM

## 2012-09-18 DIAGNOSIS — B20 Human immunodeficiency virus [HIV] disease: Secondary | ICD-10-CM

## 2012-09-18 NOTE — Progress Notes (Signed)
Patient ID: Anthony Steele, male   DOB: [birth year], 52 y.o.   MRN: [id number]          [facility] for Infectious Disease  Patient Active Problem List  Diagnosis  . HIV DISEASE  . GENITAL HERPES  . HEPATITIS B, CHRONIC  . VENEREAL WART  . ANXIETY DISORDER, GENERALIZED  . CIGARETTE SMOKER  . PNEUMONIA  . GERD  . NEPHROLITHIASIS  . PSORIASIS  . DEGENERATIVE DISC DISEASE  . CERVICAL RADICULOPATHY  . LOW BACK PAIN, ACUTE  . HYPERTENSION NEC  . Dizziness  . Obesity  . Preventative health care  . Hyperlipidemia  . Carpal tunnel syndrome, right    Patient's Medications  New Prescriptions   No medications on file  Previous Medications   AMLODIPINE (NORVASC) 5 MG TABLET    Take 1 tablet (5 mg total) by mouth daily.   ASPIRIN 81 MG TABLET    Take 81 mg by mouth daily.   EFAVIRENZ-EMTRICITABINE-TENOFOVIR (ATRIPLA) 600-200-300 MG PER TABLET    Take 1 tablet by mouth daily.   OMEPRAZOLE (PRILOSEC) 10 MG CAPSULE    Take 10 mg by mouth daily.   SCOPOLAMINE (TRANSDERM-SCOP) 1.5 MG    Place 1 patch (1.5 mg total) onto the skin every 3 (three) days.   VALACYCLOVIR (VALTREX) 500 MG TABLET    Take 1 tablet (500 mg total) by mouth 2 (two) times daily as needed.  Modified Medications   No medications on file  Discontinued Medications   No medications on file    Subjective: He is in for his routine visit. As usual, he never misses a single dose of his Atripla. He says that he takes his blood pressure once a week in acute around 140/90. His been bothered by a new skin lesion on his anterior chest recently.  Objective: Temp: 98.4 F (36.9 C) (02/25 0835) Temp src: Oral (02/25 0835) BP: 165/100 mmHg (02/25 0839) Pulse Rate: 83 (02/25 0839)  General: well dressed and in good spirits Skin: hyperkeratotic papule right anterior chest Lungs: clear Cor: regular S1 and S2 no murmurs  Lab Results HIV 1 RNA Quant (copies/mL)  Date Value  09/04/2012 <20   03/13/2012 <20   08/29/2011 <20       CD4 T Cell Abs (cmm)  Date Value  09/04/2012 660   03/13/2012 570   08/29/2011 700      Assessment: His HIV infection is under excellent control. I will continue Atripla.  His blood pressure may remains elevated. I suggested that he touch base with Dr. Jonny Ruiz to see if he wants to increase his Norvasc or add a second agent.  Plan: 1. Continue Atripla 2. Blood pressure control 3. Referral to Dr. Para Skeans to evaluate skin lesion   Cliffton Asters, MD [facility] for Infectious Disease [facility] [phone] pager   [phone] cell 09/18/2012, 8:49 AM

## 2012-09-18 NOTE — Addendum Note (Signed)
Addended by: Jennet Maduro D on: 09/18/2012 10:34 AM   Modules accepted: Orders

## 2012-09-20 ENCOUNTER — Encounter: Payer: Self-pay | Admitting: *Deleted

## 2012-09-20 NOTE — Progress Notes (Signed)
Patient ID: Anthony Steele, male   DOB: 1961-02-20, 52 y.o.   MRN: [id number]  Pt has appointment with [facility] [phone]) on Thursday, October 04, 2012 @ 10:20am. Have faxed pertinent information to office at [phone]. Have contacted patient to inform him of his appointment. Tacey Heap RN

## 2012-09-25 ENCOUNTER — Other Ambulatory Visit: Payer: Self-pay | Admitting: Internal Medicine

## 2012-09-25 ENCOUNTER — Encounter: Payer: Self-pay | Admitting: Internal Medicine

## 2012-09-25 MED ORDER — AMLODIPINE BESYLATE 10 MG PO TABS: 10.0000 mg | ORAL_TABLET | Freq: Every day | ORAL | Status: DC

## 2012-09-26 ENCOUNTER — Other Ambulatory Visit: Payer: Self-pay | Admitting: Internal Medicine

## 2012-09-26 MED ORDER — AMLODIPINE BESYLATE 10 MG PO TABS: 10.0000 mg | ORAL_TABLET | Freq: Every day | ORAL | Status: DC

## 2012-10-02 ENCOUNTER — Other Ambulatory Visit: Payer: BC Managed Care – PPO

## 2012-10-04 ENCOUNTER — Other Ambulatory Visit: Payer: Self-pay

## 2012-11-14 ENCOUNTER — Other Ambulatory Visit: Payer: Self-pay | Admitting: *Deleted

## 2012-11-14 DIAGNOSIS — B20 Human immunodeficiency virus [HIV] disease: Secondary | ICD-10-CM

## 2012-11-14 MED ORDER — EFAVIRENZ-EMTRICITAB-TENOFOVIR 600-200-300 MG PO TABS: 1.0000 | ORAL_TABLET | Freq: Every day | ORAL | Status: DC

## 2012-11-29 ENCOUNTER — Ambulatory Visit: Payer: BC Managed Care – PPO | Admitting: Internal Medicine

## 2012-12-11 ENCOUNTER — Encounter: Payer: Self-pay | Admitting: Internal Medicine

## 2012-12-11 ENCOUNTER — Ambulatory Visit (INDEPENDENT_AMBULATORY_CARE_PROVIDER_SITE_OTHER): Payer: BC Managed Care – PPO | Admitting: Internal Medicine

## 2012-12-11 VITALS — BP 132/80 | HR 71 | Temp 98.6°F | Ht 72.0 in | Wt 218.2 lb

## 2012-12-11 DIAGNOSIS — E785 Hyperlipidemia, unspecified: Secondary | ICD-10-CM

## 2012-12-11 DIAGNOSIS — F411 Generalized anxiety disorder: Secondary | ICD-10-CM

## 2012-12-11 DIAGNOSIS — IMO0002 Reserved for concepts with insufficient information to code with codable children: Secondary | ICD-10-CM

## 2012-12-11 DIAGNOSIS — Z0000 Encounter for general adult medical examination without abnormal findings: Secondary | ICD-10-CM

## 2012-12-11 MED ORDER — PRAVASTATIN SODIUM 20 MG PO TABS: 20.0000 mg | ORAL_TABLET | Freq: Every day | ORAL | Status: DC

## 2012-12-11 NOTE — Assessment & Plan Note (Addendum)
Mild persistent despite diet, goal < 100 - for pravastatin 20 qd, to avoid hepatic metabolism Lab Results  Component Value Date   LDLCALC 126* 09/04/2012

## 2012-12-11 NOTE — Assessment & Plan Note (Signed)
Not  Charged for repeat order for colonsocpy

## 2012-12-11 NOTE — Progress Notes (Signed)
Subjective:    Patient ID: Anthony Steele, male    DOB: [birth year], 52 y.o.   MRN: [id number]  HPI Here to f/u; overall doing ok,  Pt denies chest pain, increased sob or doe, wheezing, orthopnea, PND, increased LE swelling, palpitations, dizziness or syncope.  Pt denies polydipsia, polyuria, or low sugar symptoms such as weakness or confusion improved with po intake.  Pt denies new neurological symptoms such as new headache, or facial or extremity weakness or numbness.   Pt states overall good compliance with meds, has been trying to follow lower cholesterol diet, with wt overall stable,  but little exercise however.  Needs colonoscopy re-oredered to scheduling conflict. Has ongoing right R CTS symptoms, mild to mod, putting off surgury per Dr Orlan Leavens.  Also with ongoing mild to mod cervical disc dz without chanage, has seen Dr Otelia Sergeant several yrs ago.  Denies worsening depressive symptoms, suicidal ideation, or panic; has ongoing anxiety, not increased recently.   Follows closely with ID as well. Past Medical History  Diagnosis Date  . GERD (gastroesophageal reflux disease)   . HIV infection   . Hyperlipidemia   . Hypertension   . Kidney stones   . ANXIETY DISORDER, GENERALIZED 08/22/2006    Qualifier: Diagnosis of  By: Orvan Falconer MD, Dvora Buitron    . CERVICAL RADICULOPATHY 08/22/2006    Annotation: L arm 2002 Qualifier: Diagnosis of  By: Orvan Falconer MD, Refoel Palladino    . DEGENERATIVE DISC DISEASE 08/22/2006    Qualifier: Diagnosis of  By: Orvan Falconer MD, Javien Tesch    . GENITAL HERPES 08/22/2006    Qualifier: Diagnosis of  By: Orvan Falconer MD, Brenisha Tsui    . GERD 07/28/2006    Qualifier: Diagnosis of  By: Orvan Falconer MD, Flecia Shutter    . HEPATITIS B, CHRONIC 07/28/2006    Qualifier: Diagnosis of  By: Orvan Falconer MD, Miela Desjardin    . HIV DISEASE 07/28/2006    Annotation: dx 2000 Qualifier: Diagnosis of  By: Orvan Falconer MD, Kade Demicco    . HYPERTENSION NEC 07/07/2009    Qualifier: Diagnosis of  By: Orvan Falconer MD, Braysen Cloward    . NEPHROLITHIASIS 08/22/2006    Annotation: x2,  last 1992 Qualifier: Diagnosis of  By: Orvan Falconer MD, Kacey Vicuna    . Obesity 09/12/2011  . PSORIASIS 08/22/2006    Qualifier: Diagnosis of  By: Orvan Falconer MD, Saliou Barnier    . Carpal tunnel syndrome, right 05/31/2012   No past surgical history on file.  reports that he quit smoking about 4 months ago. His smoking use included Cigarettes. He smoked 0.10 packs per day. He has never used smokeless tobacco. He reports that he drinks about 1.5 ounces of alcohol per week. He reports that he does not use illicit drugs. family history includes Arthritis in his other; Cancer in his other; Heart disease in his other; and Hypertension in his other. Allergies  Allergen Reactions  . Lisinopril Rash   Current Outpatient Prescriptions on File Prior to Visit  Medication Sig Dispense Refill  . amLODipine (NORVASC) 10 MG tablet Take 1 tablet (10 mg total) by mouth daily.  90 tablet  3  . aspirin 81 MG tablet Take 81 mg by mouth daily.      Marland Kitchen efavirenz-emtricitabine-tenofovir (ATRIPLA) 600-200-300 MG per tablet Take 1 tablet by mouth daily.  90 tablet  3  . omeprazole (PRILOSEC) 10 MG capsule Take 10 mg by mouth daily.      Marland Kitchen scopolamine (TRANSDERM-SCOP) 1.5 MG Place 1 patch (1.5 mg total) onto the skin every 3 (three) days.  10  patch  12  . valACYclovir (VALTREX) 500 MG tablet Take 1 tablet (500 mg total) by mouth 2 (two) times daily as needed.  180 tablet  0   No current facility-administered medications on file prior to visit.    Review of Systems  Constitutional: Negative for unexpected weight change, or unusual diaphoresis  HENT: Negative for tinnitus.   Eyes: Negative for photophobia and visual disturbance.  Respiratory: Negative for choking and stridor.   Gastrointestinal: Negative for vomiting and blood in stool.  Genitourinary: Negative for hematuria and decreased urine volume.  Musculoskeletal: Negative for acute joint swelling Skin: Negative for color change and wound.  Neurological: Negative for tremors and  numbness other than noted  Psychiatric/Behavioral: Negative for decreased concentration or  hyperactivity.       Objective:   Physical Exam  BP 132/80  Pulse 71  Temp(Src) 98.6 F (37 C) (Oral)  Ht 6' (1.829 m)  Wt 218 lb 4 oz (98.998 kg)  BMI 29.59 kg/m2  SpO2 96% VS noted,  Constitutional: Pt appears well-developed and well-nourished.  HENT: Head: NCAT.  Right Ear: External ear normal.  Left Ear: External ear normal.  Eyes: Conjunctivae and EOM are normal. Pupils are equal, round, and reactive to light.  Neck: Normal range of motion. Neck supple.  Cardiovascular: Normal rate and regular rhythm.   Pulmonary/Chest: Effort normal and breath sounds normal.  Abd:  Soft, NT, non-distended, + BS Neurological: Pt is alert. Not confused  Skin: Skin is warm. No erythema.  Psychiatric: Pt behavior is normal. Thought content normal. not nervous     Assessment & Plan:

## 2012-12-11 NOTE — Assessment & Plan Note (Signed)
Improved and now stable overall by history and exam, recent data reviewed with pt, and pt to continue medical treatment as before,  to f/u any worsening symptoms or concerns BP Readings from Last 3 Encounters:  12/11/12 132/80  09/18/12 165/100  05/31/12 140/90

## 2012-12-11 NOTE — Assessment & Plan Note (Signed)
stable overall by history and exam, recent data reviewed with pt, and pt to continue medical treatment as before,  to f/u any worsening symptoms or concerns Lab Results  Component Value Date   WBC 5.4 09/04/2012   HGB 14.1 09/04/2012   HCT 42.0 09/04/2012   PLT 143* 09/04/2012   GLUCOSE 98 09/04/2012   CHOL 200 09/04/2012   TRIG 121 09/04/2012   HDL 50 09/04/2012   LDLCALC 126* 09/04/2012   ALT 31 09/04/2012   AST 29 09/04/2012   NA 141 09/04/2012   K 4.4 09/04/2012   CL 105 09/04/2012   CREATININE 1.02 09/04/2012   BUN 17 09/04/2012   CO2 28 09/04/2012   PSA 0.14 05/31/2012

## 2012-12-11 NOTE — Patient Instructions (Addendum)
OK to take the pravastatin for elevated cholesterol as this is not liver metabolized Please continue all other medications as before, and refills have been done if requested. Please keep your appointments with your specialists as you have planned No further lab work needed today You will be contacted regarding the referral for: colonoscopy Thank you for enrolling in MyChart. Please follow the instructions below to securely access your online medical record. MyChart allows you to send messages to your doctor, view your test results, renew your prescriptions, schedule appointments, and more. Please return in 6 months, or sooner if needed, with Lab testing done 3-5 days before

## 2012-12-12 ENCOUNTER — Encounter: Payer: Self-pay | Admitting: Internal Medicine

## 2013-02-04 ENCOUNTER — Encounter: Payer: Self-pay | Admitting: Internal Medicine

## 2013-02-04 ENCOUNTER — Ambulatory Visit (AMBULATORY_SURGERY_CENTER): Payer: BC Managed Care – PPO | Admitting: *Deleted

## 2013-02-04 VITALS — Ht 72.0 in | Wt 217.4 lb

## 2013-02-04 DIAGNOSIS — Z1211 Encounter for screening for malignant neoplasm of colon: Secondary | ICD-10-CM

## 2013-02-04 MED ORDER — MOVIPREP 100 G PO SOLR: 1.0000 | Freq: Once | ORAL | Status: DC

## 2013-02-04 NOTE — Progress Notes (Signed)
No egg or soy allergy. No anesthesia problems.  

## 2013-02-18 ENCOUNTER — Ambulatory Visit (AMBULATORY_SURGERY_CENTER): Payer: BC Managed Care – PPO | Admitting: Internal Medicine

## 2013-02-18 ENCOUNTER — Encounter: Payer: Self-pay | Admitting: Internal Medicine

## 2013-02-18 VITALS — BP 115/68 | HR 66 | Resp 16 | Ht 72.0 in | Wt 217.0 lb

## 2013-02-18 DIAGNOSIS — K6389 Other specified diseases of intestine: Secondary | ICD-10-CM

## 2013-02-18 DIAGNOSIS — Z1211 Encounter for screening for malignant neoplasm of colon: Secondary | ICD-10-CM

## 2013-02-18 DIAGNOSIS — D126 Benign neoplasm of colon, unspecified: Secondary | ICD-10-CM

## 2013-02-18 MED ORDER — SODIUM CHLORIDE 0.9 % IV SOLN: 500.0000 mL | INTRAVENOUS | Status: DC

## 2013-02-18 NOTE — Op Note (Signed)
 [facility] [address] [place], [address]   COLONOSCOPY PROCEDURE REPORT  PATIENT: Anthony Steele, Anthony Steele  MR#: [id number] BIRTHDATE: 03-Aug-1960 , 51  yrs. old GENDER: Male ENDOSCOPIST: Beverley Fiedler, MD REFERRED IO:NGEXB [name], M.D. PROCEDURE DATE:  02/18/2013 PROCEDURE:   Colonoscopy with snare polypectomy and Colonoscopy with cold biopsy polypectomy ASA CLASS:   Class III INDICATIONS:elevated risk screening, Patient's immediate family history of colon cancer, and first colonoscopy. MEDICATIONS: MAC sedation, administered by CRNA and propofol (Diprivan) 350mg  IV  DESCRIPTION OF PROCEDURE:   After the risks benefits and alternatives of the procedure were thoroughly explained, informed consent was obtained.  A digital rectal exam revealed no rectal mass.   The LB [id number]  endoscope was introduced through the anus and advanced to the cecum, which was identified by both the appendix and ileocecal valve. No adverse events experienced. The quality of the prep was good, using MoviPrep  The instrument was then slowly withdrawn as the colon was fully examined.   COLON FINDINGS: A sessile polyp measuring 3 mm in size was found in the ascending colon.  A polypectomy was performed with cold forceps.  The resection was complete and the polyp tissue was completely retrieved.   A sessile polyp measuring 5 mm in size was found in the rectosigmoid colon.  A polypectomy was performed with a cold snare.  The resection was complete and the polyp tissue was completely retrieved.   There was mild diverticulosis noted in the sigmoid colon with associated muscular hypertrophy.  Retroflexed views revealed no abnormalities. The time to cecum=3 minutes 38 seconds.  Withdrawal time=15 minutes 45 seconds.  The scope was withdrawn and the procedure completed. COMPLICATIONS: There were no complications.  ENDOSCOPIC IMPRESSION: 1.   Sessile polyp measuring 3 mm in size was found in  the ascending colon; polypectomy was performed with cold forceps 2.   Sessile polyp measuring 5 mm in size was found in the rectosigmoid colon; polypectomy was performed with a cold snare 3.   There was mild diverticulosis noted in the sigmoid colon  RECOMMENDATIONS: 1.  Await pathology results 2.  High fiber diet 3.  Given your significant family history of colon cancer, you should have a repeat colonoscopy in 5 years 4.  You will receive a letter within 1-2 weeks with the results of your biopsy as well as final recommendations.  Please call my office if you have not received a letter after 3 weeks.   eSigned:  Beverley Fiedler, MD 02/18/2013 8:56 AM cc: The Patient and Corwin Levins, MD

## 2013-02-18 NOTE — Patient Instructions (Addendum)
YOU HAD AN ENDOSCOPIC PROCEDURE TODAY AT THE [facility]: Refer to the procedure report that was given to you for any specific questions about what was found during the examination.  If the procedure report does not answer your questions, please call your gastroenterologist to clarify.  If you requested that your care partner not be given the details of your procedure findings, then the procedure report has been included in a sealed envelope for you to review at your convenience later.  YOU SHOULD EXPECT: Some feelings of bloating in the abdomen. Passage of more gas than usual.  Walking can help get rid of the air that was put into your GI tract during the procedure and reduce the bloating. If you had a lower endoscopy (such as a colonoscopy or flexible sigmoidoscopy) you may notice spotting of blood in your stool or on the toilet paper. If you underwent a bowel prep for your procedure, then you may not have a normal bowel movement for a few days.  DIET: Your first meal following the procedure should be a light meal and then it is ok to progress to your normal diet.  A half-sandwich or bowl of soup is an example of a good first meal.  Heavy or fried foods are harder to digest and may make you feel nauseous or bloated.  Likewise meals heavy in dairy and vegetables can cause extra gas to form and this can also increase the bloating.  Drink plenty of fluids but you should avoid alcoholic beverages for 24 hours.  ACTIVITY: Your care partner should take you home directly after the procedure.  You should plan to take it easy, moving slowly for the rest of the day.  You can resume normal activity the day after the procedure however you should NOT DRIVE or use heavy machinery for 24 hours (because of the sedation medicines used during the test).    SYMPTOMS TO REPORT IMMEDIATELY: A gastroenterologist can be reached at any hour.  During normal business hours, 8:30 AM to 5:00 PM Monday through Friday,  call [phone].  After hours and on weekends, please call the GI answering service at [phone] who will take a message and have the physician on call contact you.   Following lower endoscopy (colonoscopy or flexible sigmoidoscopy):  Excessive amounts of blood in the stool  Significant tenderness or worsening of abdominal pains  Swelling of the abdomen that is new, acute  Fever of 100F or higher   FOLLOW UP: If any biopsies were taken you will be contacted by phone or by letter within the next 1-3 weeks.  Call your gastroenterologist if you have not heard about the biopsies in 3 weeks.  Our staff will call the home number listed on your records the next business day following your procedure to check on you and address any questions or concerns that you may have at that time regarding the information given to you following your procedure. This is a courtesy call and so if there is no answer at the home number and we have not heard from you through the emergency physician on call, we will assume that you have returned to your regular daily activities without incident.  SIGNATURES/CONFIDENTIALITY: You and/or your care partner have signed paperwork which will be entered into your electronic medical record.  These signatures attest to the fact that that the information above on your After Visit Summary has been reviewed and is understood.  Full responsibility of the confidentiality of   this discharge information lies with you and/or your care-partner.   Resume medications. Information given on polyps,diverticulosis and high fiber diet with discharge instructions. 

## 2013-02-18 NOTE — Progress Notes (Signed)
Patient did not experience any of the following events: a burn prior to discharge; a fall within the facility; wrong site/side/patient/procedure/implant event; or a hospital transfer or hospital admission upon discharge from the facility. (G8907) Patient did not have preoperative order for IV antibiotic SSI prophylaxis. (G8918)  

## 2013-02-18 NOTE — Progress Notes (Signed)
Procedure ends, to recovery, report given and VSS. 

## 2013-02-18 NOTE — Progress Notes (Signed)
Called to room to assist during endoscopic procedure.  Patient ID and intended procedure confirmed with present staff. Received instructions for my participation in the procedure from the performing physician.  

## 2013-02-19 ENCOUNTER — Telehealth: Payer: Self-pay

## 2013-02-19 NOTE — Telephone Encounter (Signed)
Left a message on the pt's answering machine to call if he has any questions or concerns at # [phone]. Maw

## 2013-02-21 ENCOUNTER — Encounter: Payer: Self-pay | Admitting: Internal Medicine

## 2013-03-11 ENCOUNTER — Other Ambulatory Visit (INDEPENDENT_AMBULATORY_CARE_PROVIDER_SITE_OTHER): Payer: BC Managed Care – PPO

## 2013-03-11 DIAGNOSIS — B20 Human immunodeficiency virus [HIV] disease: Secondary | ICD-10-CM

## 2013-03-12 LAB — HIV-1 RNA QUANT-NO REFLEX-BLD: HIV-1 RNA Quant, Log: <1.3 {Log} (ref <1.30)

## 2013-03-26 ENCOUNTER — Encounter: Payer: Self-pay | Admitting: Internal Medicine

## 2013-03-26 ENCOUNTER — Ambulatory Visit (INDEPENDENT_AMBULATORY_CARE_PROVIDER_SITE_OTHER): Payer: BC Managed Care – PPO | Admitting: Internal Medicine

## 2013-03-26 VITALS — BP 156/95 | HR 74 | Temp 98.6°F | Ht 72.0 in | Wt 221.2 lb

## 2013-03-26 DIAGNOSIS — B20 Human immunodeficiency virus [HIV] disease: Secondary | ICD-10-CM

## 2013-03-26 DIAGNOSIS — Z23 Encounter for immunization: Secondary | ICD-10-CM

## 2013-03-26 NOTE — Progress Notes (Signed)
Patient ID: Anthony Steele, male   DOB: 02/07/1961, 52 y.o.   MRN: [id number]          [facility] for [facility]  Patient Active Problem List   Diagnosis Date Noted  . HIV DISEASE 07/28/2006    Priority: High  . Obesity 09/12/2011    Priority: Medium  . HYPERTENSION NEC 07/07/2009    Priority: Medium  . CIGARETTE SMOKER 05/08/2007    Priority: Medium  . GERD 07/28/2006    Priority: Medium  . Preventative health care 05/31/2012  . Hyperlipidemia 05/31/2012  . Carpal tunnel syndrome, right 05/31/2012  . Dizziness 04/19/2011  . LOW BACK PAIN, ACUTE 05/27/2010  . GENITAL HERPES 08/22/2006  . VENEREAL WART 08/22/2006  . ANXIETY DISORDER, GENERALIZED 08/22/2006  . NEPHROLITHIASIS 08/22/2006  . PSORIASIS 08/22/2006  . DEGENERATIVE DISC DISEASE 08/22/2006  . CERVICAL RADICULOPATHY 08/22/2006  . HEPATITIS B, CHRONIC 07/28/2006  . PNEUMONIA 01/22/2006    Patient's Medications  New Prescriptions   No medications on file  Previous Medications   AMLODIPINE (NORVASC) 10 MG TABLET    Take 1 tablet (10 mg total) by mouth daily.   ASPIRIN 81 MG TABLET    Take 81 mg by mouth daily.   EFAVIRENZ-EMTRICITABINE-TENOFOVIR (ATRIPLA) 600-200-300 MG PER TABLET    Take 1 tablet by mouth daily.   OMEPRAZOLE (PRILOSEC) 10 MG CAPSULE    Take 10 mg by mouth daily.   PRAVASTATIN (PRAVACHOL) 20 MG TABLET    Take 1 tablet (20 mg total) by mouth daily.   SCOPOLAMINE (TRANSDERM-SCOP) 1.5 MG    Place 1 patch (1.5 mg total) onto the skin every 3 (three) days.   VALACYCLOVIR (VALTREX) 500 MG TABLET    Take 1 tablet (500 mg total) by mouth 2 (two) times daily as needed.  Modified Medications   No medications on file  Discontinued Medications   No medications on file    Subjective: [name] is in for his routine visit. As usual he never misses a single dose of his Atripla. He is doing well and has no new problems or concerns since his last visit.  Review of Systems: Pertinent items are noted  in HPI.  Past Medical History  Diagnosis Date  . GERD (gastroesophageal reflux disease)   . HIV infection   . Hyperlipidemia   . Hypertension   . Kidney stones   . ANXIETY DISORDER, GENERALIZED 08/22/2006    Qualifier: Diagnosis of  By: Orvan Falconer MD, Janson Lamar    . DEGENERATIVE DISC DISEASE 08/22/2006    Qualifier: Diagnosis of  By: Orvan Falconer MD, Wilmar Prabhakar    . GENITAL HERPES 08/22/2006    Qualifier: Diagnosis of  By: Orvan Falconer MD, Elizabth Palka    . GERD 07/28/2006    Qualifier: Diagnosis of  By: Orvan Falconer MD, Almeta Geisel    . HEPATITIS B, CHRONIC 07/28/2006    Qualifier: Diagnosis of  By: Orvan Falconer MD, Treysen Sudbeck    . HIV DISEASE 07/28/2006    Annotation: dx 2000 Qualifier: Diagnosis of  By: Orvan Falconer MD, Masahiro Iglesia    . HYPERTENSION NEC 07/07/2009    Qualifier: Diagnosis of  By: Orvan Falconer MD, Tabari Volkert    . NEPHROLITHIASIS 08/22/2006    Annotation: x2, last 1992 Qualifier: Diagnosis of  By: Orvan Falconer MD, Hannelore Bova    . Obesity 09/12/2011  . PSORIASIS 08/22/2006    Qualifier: Diagnosis of  By: Orvan Falconer MD, Madell Heino    . CERVICAL RADICULOPATHY 08/22/2006    Annotation: L arm 2002 Qualifier: Diagnosis of  By: Orvan Falconer MD, Keyonte Cookston    .  Carpal tunnel syndrome, right 05/31/2012    History  Substance Use Topics  . Smoking status: Former Smoker -- 0.10 packs/day    Types: Cigarettes    Quit date: 07/18/2012  . Smokeless tobacco: Never Used     Comment: quit over cruise over the holidays  . Alcohol Use: 1.5 oz/week    3 drink(s) per week    Family History  Problem Relation Age of Onset  . Heart disease Other   . Hypertension Other   . Arthritis Other   . Cancer Other     colon cancer  . Colon cancer Sister     Allergies  Allergen Reactions  . Lisinopril Rash    Objective: Temp: 98.6 F (37 C) (09/02 0842) Temp src: Oral (09/02 0842) BP: 156/95 mmHg (09/02 0842) Pulse Rate: 74 (09/02 0842)  Steele: He is in good spirits Oral: No oropharyngeal lesions Skin: No rash Lungs: Clear Cor: Regular S1-S2 no murmurs Mood and affect:  Normal  Lab Results HIV 1 RNA Quant (copies/mL)  Date Value  03/11/2013 <20   09/04/2012 <20   03/13/2012 <20      CD4 T Cell Abs (cmm)  Date Value  03/11/2013 530   09/04/2012 660   03/13/2012 570      Assessment: His HIV infection remains under excellent control.  Plan: 1. Continue Atripla 2. Followup after lab work in 6 months   Cliffton Asters, MD [facility] [phone] pager   [phone] cell 03/26/2013, 8:53 AM

## 2013-04-05 ENCOUNTER — Other Ambulatory Visit: Payer: Self-pay

## 2013-04-05 DIAGNOSIS — B009 Herpesviral infection, unspecified: Secondary | ICD-10-CM

## 2013-04-05 MED ORDER — VALACYCLOVIR HCL 500 MG PO TABS: 500.0000 mg | ORAL_TABLET | Freq: Two times a day (BID) | ORAL | Status: DC | PRN

## 2013-04-05 NOTE — Telephone Encounter (Signed)
Pharmacy calling for refill of Valtrex .  [facility].  Medication sent to pharmacy.    Laurell Josephs, RN

## 2013-05-28 ENCOUNTER — Other Ambulatory Visit: Payer: Self-pay | Admitting: Internal Medicine

## 2013-06-17 ENCOUNTER — Ambulatory Visit: Payer: BC Managed Care – PPO | Admitting: Internal Medicine

## 2013-06-18 ENCOUNTER — Encounter: Payer: Self-pay | Admitting: Internal Medicine

## 2013-06-18 ENCOUNTER — Other Ambulatory Visit (INDEPENDENT_AMBULATORY_CARE_PROVIDER_SITE_OTHER): Payer: BC Managed Care – PPO

## 2013-06-18 ENCOUNTER — Telehealth: Payer: Self-pay

## 2013-06-18 ENCOUNTER — Ambulatory Visit (INDEPENDENT_AMBULATORY_CARE_PROVIDER_SITE_OTHER): Payer: BC Managed Care – PPO | Admitting: Internal Medicine

## 2013-06-18 VITALS — BP 120/82 | HR 68 | Temp 98.4°F | Ht 72.0 in | Wt 224.1 lb

## 2013-06-18 DIAGNOSIS — Z0000 Encounter for general adult medical examination without abnormal findings: Secondary | ICD-10-CM

## 2013-06-18 DIAGNOSIS — M79609 Pain in unspecified limb: Secondary | ICD-10-CM

## 2013-06-18 DIAGNOSIS — Z23 Encounter for immunization: Secondary | ICD-10-CM

## 2013-06-18 DIAGNOSIS — M79671 Pain in right foot: Secondary | ICD-10-CM

## 2013-06-18 DIAGNOSIS — E785 Hyperlipidemia, unspecified: Secondary | ICD-10-CM

## 2013-06-18 LAB — LIPID PANEL
LDL Cholesterol: 105 mg/dL — ABNORMAL HIGH (ref 0–99)
Total CHOL/HDL Ratio: 4
Triglycerides: 126 mg/dL (ref 0.0–149.0)

## 2013-06-18 NOTE — Patient Instructions (Addendum)
You had the Prevnar pneumonia shot Please go to the LAB in the Basement (turn left off the elevator) for the tests to be done today - the PSA and lipids Please continue all other medications as before, and refills have been done if requested. Please have the pharmacy call with any other refills you may need. Please continue your efforts at being more active, low cholesterol diet, and weight control. You are otherwise up to date with prevention measures today. Please keep your appointments with your specialists as you have planned  Please remember to sign up for My Chart if you have not done so, as this will be important to you in the future with finding out test results, communicating by private email, and scheduling acute appointments online when needed.  Please return in 1 year for your yearly visit, or sooner if needed

## 2013-06-18 NOTE — Assessment & Plan Note (Signed)
For f/u lipids today, cont diet

## 2013-06-18 NOTE — Assessment & Plan Note (Signed)

## 2013-06-18 NOTE — Assessment & Plan Note (Signed)
C/w plantar fasciitis, for soft soled shoes, consdier refer sport medicine if not improved 1-2 wks

## 2013-06-18 NOTE — Telephone Encounter (Signed)
Message copied by Pincus Sanes on Tue Jun 18, 2013  4:34 PM ------      Message from: Corwin Levins      Created: Tue Jun 18, 2013  3:44 PM      Regarding: right heel pain       Please let pt know b/c I neglected to tell him      If his right heel does not improve, he should see Dr Katrinka Blazing in our office (sports medicine) , or i could refer now even ------

## 2013-06-18 NOTE — Progress Notes (Signed)
Subjective:    Patient ID: Anthony Steele, male    DOB: 1961-04-05, 52 y.o.   MRN: [id number]  HPI  Here for wellness and f/u;  Overall doing ok;  Pt denies CP, worsening SOB, DOE, wheezing, orthopnea, PND, worsening LE edema, palpitations, dizziness or syncope.  Pt denies neurological change such as new headache, facial or extremity weakness.  Pt denies polydipsia, polyuria, or low sugar symptoms. Pt states overall good compliance with treatment and medications, good tolerability, and has been trying to follow lower cholesterol diet.  Pt denies worsening depressive symptoms, suicidal ideation or panic. No fever, night sweats, wt loss, loss of appetite, or other constitutional symptoms.  Pt states good ability with ADL's, has low fall risk, home safety reviewed and adequate, no other significant changes in hearing or vision, and only occasionally active with exercise. Does have sharp pain to right heel x 3 mo, worse with first few steps in the am. Tolerating statin well. Past Medical History  Diagnosis Date  . GERD (gastroesophageal reflux disease)   . HIV infection   . Hyperlipidemia   . Hypertension   . Kidney stones   . ANXIETY DISORDER, GENERALIZED 08/22/2006    Qualifier: Diagnosis of  By: Orvan Falconer MD, [name]    . DEGENERATIVE DISC DISEASE 08/22/2006    Qualifier: Diagnosis of  By: Orvan Falconer MD, [name]    . GENITAL HERPES 08/22/2006    Qualifier: Diagnosis of  By: Orvan Falconer MD, [name]    . GERD 07/28/2006    Qualifier: Diagnosis of  By: Orvan Falconer MD, [name]    . HEPATITIS B, CHRONIC 07/28/2006    Qualifier: Diagnosis of  By: Orvan Falconer MD, [name]    . HIV DISEASE 07/28/2006    Annotation: dx 2000 Qualifier: Diagnosis of  By: Orvan Falconer MD, [name]    . HYPERTENSION NEC 07/07/2009    Qualifier: Diagnosis of  By: Orvan Falconer MD, [name]    . NEPHROLITHIASIS 08/22/2006    Annotation: x2, last 1992 Qualifier: Diagnosis of  By: Orvan Falconer MD, [name]    . Obesity 09/12/2011  . PSORIASIS 08/22/2006    Qualifier: Diagnosis of  By:  Orvan Falconer MD, [name]    . CERVICAL RADICULOPATHY 08/22/2006    Annotation: L arm 2002 Qualifier: Diagnosis of  By: Orvan Falconer MD, [name]    . Carpal tunnel syndrome, right 05/31/2012   Past Surgical History  Procedure Laterality Date  . Cystectomy  2000    cyst removed off buttock cheek    reports that he quit smoking about 11 months ago. His smoking use included Cigarettes. He smoked 0.10 packs per day. He has never used smokeless tobacco. He reports that he drinks about 1.5 ounces of alcohol per week. He reports that he does not use illicit drugs. family history includes Arthritis in his other; Cancer in his other; Colon cancer in his sister; Heart disease in his other; Hypertension in his other. Allergies  Allergen Reactions  . Lisinopril Rash   Current Outpatient Prescriptions on File Prior to Visit  Medication Sig Dispense Refill  . amLODipine (NORVASC) 10 MG tablet Take 1 tablet (10 mg total) by mouth daily.  90 tablet  3  . aspirin 81 MG tablet Take 81 mg by mouth daily.      Marland Kitchen efavirenz-emtricitabine-tenofovir (ATRIPLA) 600-200-300 MG per tablet Take 1 tablet by mouth daily.  90 tablet  3  . omeprazole (PRILOSEC) 10 MG capsule Take 10 mg by mouth daily.      . pravastatin (PRAVACHOL) 20 MG tablet Take  1 tablet (20 mg total) by mouth daily.  90 tablet  3  . scopolamine (TRANSDERM-SCOP) 1.5 MG Place 1 patch (1.5 mg total) onto the skin every 3 (three) days.  10 patch  12  . valACYclovir (VALTREX) 500 MG tablet TAKE 1 TABLET BY MOUTH    TWICE A DAY  180 tablet  2   No current facility-administered medications on file prior to visit.   Review of Systems Constitutional: Negative for diaphoresis, activity change, appetite change or unexpected weight change.  HENT: Negative for hearing loss, ear pain, facial swelling, mouth sores and neck stiffness.   Eyes: Negative for pain, redness and visual disturbance.  Respiratory: Negative for shortness of breath and wheezing.   Cardiovascular:  Negative for chest pain and palpitations.  Gastrointestinal: Negative for diarrhea, blood in stool, abdominal distention or other pain Genitourinary: Negative for hematuria, flank pain or change in urine volume.  Musculoskeletal: Negative for myalgias and joint swelling.  Skin: Negative for color change and wound.  Neurological: Negative for syncope and numbness. other than noted Hematological: Negative for adenopathy.  Psychiatric/Behavioral: Negative for hallucinations, self-injury, decreased concentration and agitation.      Objective:   Physical Exam BP 120/82  Pulse 68  Temp(Src) 98.4 F (36.9 C) (Oral)  Ht 6' (1.829 m)  Wt 224 lb 2 oz (101.662 kg)  BMI 30.39 kg/m2  SpO2 97% VS noted,  Constitutional: Pt is oriented to person, place, and time. Appears well-developed and well-nourished.  Head: Normocephalic and atraumatic.  Right Ear: External ear normal.  Left Ear: External ear normal.  Nose: Nose normal.  Mouth/Throat: Oropharynx is clear and moist.  Eyes: Conjunctivae and EOM are normal. Pupils are equal, round, and reactive to light.  Neck: Normal range of motion. Neck supple. No JVD present. No tracheal deviation present.  Cardiovascular: Normal rate, regular rhythm, normal heart sounds and intact distal pulses.   Pulmonary/Chest: Effort normal and breath sounds normal.  Abdominal: Soft. Bowel sounds are normal. There is no tenderness. No HSM  Musculoskeletal: Normal range of motion. Exhibits no edema.  Lymphadenopathy:  Has no cervical adenopathy.  Neurological: Pt is alert and oriented to person, place, and time. Pt has normal reflexes. No cranial nerve deficit.  Skin: Skin is warm and dry. No rash noted.  Psychiatric:  Has  normal mood and affect. Behavior is normal.     Assessment & Plan:

## 2013-06-18 NOTE — Progress Notes (Signed)
Pre-visit discussion using our clinic review tool. No additional management support is needed unless otherwise documented below in the visit note.  

## 2013-06-18 NOTE — Telephone Encounter (Signed)
Informed the patient and he did appreciate the call, would like to wait it out will call back later if gets no better.

## 2013-08-21 ENCOUNTER — Encounter: Payer: Self-pay | Admitting: Internal Medicine

## 2013-08-21 ENCOUNTER — Other Ambulatory Visit: Payer: Self-pay

## 2013-08-21 NOTE — Telephone Encounter (Signed)
Nancy to see above 

## 2013-08-22 ENCOUNTER — Encounter: Payer: Self-pay | Admitting: Internal Medicine

## 2013-08-22 ENCOUNTER — Ambulatory Visit (INDEPENDENT_AMBULATORY_CARE_PROVIDER_SITE_OTHER): Payer: BC Managed Care – PPO | Admitting: Internal Medicine

## 2013-08-22 VITALS — BP 110/82 | HR 80 | Temp 98.8°F | Ht 72.0 in | Wt 219.0 lb

## 2013-08-22 DIAGNOSIS — J209 Acute bronchitis, unspecified: Secondary | ICD-10-CM

## 2013-08-22 DIAGNOSIS — IMO0002 Reserved for concepts with insufficient information to code with codable children: Secondary | ICD-10-CM

## 2013-08-22 DIAGNOSIS — R062 Wheezing: Secondary | ICD-10-CM

## 2013-08-22 MED ORDER — LEVOFLOXACIN 250 MG PO TABS: 250.0000 mg | ORAL_TABLET | Freq: Every day | ORAL | Status: DC

## 2013-08-22 MED ORDER — PREDNISONE 10 MG PO TABS: 10.0000 mg | ORAL_TABLET | Freq: Every day | ORAL | Status: DC

## 2013-08-22 MED ORDER — HYDROCODONE-HOMATROPINE 5-1.5 MG/5ML PO SYRP: 5.0000 mL | ORAL_SOLUTION | Freq: Four times a day (QID) | ORAL | Status: DC | PRN

## 2013-08-22 NOTE — Patient Instructions (Signed)
Please take all new medication as prescribed Please continue all other medications as before, and refills have been done if requested.  Please remember to sign up for My Chart if you have not done so, as this will be important to you in the future with finding out test results, communicating by private email, and scheduling acute appointments online when needed.   

## 2013-08-22 NOTE — Assessment & Plan Note (Signed)
stable overall by history and exam, recent data reviewed with pt, and pt to continue medical treatment as before,  to f/u any worsening symptoms or concerns BP Readings from Last 3 Encounters:  08/22/13 110/82  06/18/13 120/82  03/26/13 156/95

## 2013-08-22 NOTE — Progress Notes (Signed)
Pre-visit discussion using our clinic review tool. No additional management support is needed unless otherwise documented below in the visit note.  

## 2013-08-22 NOTE — Assessment & Plan Note (Signed)
Mild to mod, for antibx course,  to f/u any worsening symptoms or concerns 

## 2013-08-22 NOTE — Progress Notes (Signed)
Subjective:    Patient ID: Anthony Steele, male    DOB: July 24, 1961, 53 y.o.   MRN: [id number]  HPI  Here with acute onset mild to mod 2-3 days ST, HA, general weakness and malaise, with prod cough greenish sputum, but Pt denies chest pain, increased sob or doe, wheezing, orthopnea, PND, increased LE swelling, palpitations, dizziness or syncope, except for onset wheezing, mild sob last evening. Pt denies new neurological symptoms such as new headache, or facial or extremity weakness or numbness   Pt denies polydipsia, polyuria, Denies worsening depressive symptoms, suicidal ideation, or panic  Past Medical History  Diagnosis Date  . GERD (gastroesophageal reflux disease)   . HIV infection   . Hyperlipidemia   . Hypertension   . Kidney stones   . ANXIETY DISORDER, GENERALIZED 08/22/2006    Qualifier: Diagnosis of  By: [name], Skylene Deremer    . DEGENERATIVE [place] DISEASE 08/22/2006    Qualifier: Diagnosis of  By: [name], Inetha Maret    . GENITAL HERPES 08/22/2006    Qualifier: Diagnosis of  By: [name], Ceniya Fowers    . GERD 07/28/2006    Qualifier: Diagnosis of  By: [name], Giani Betzold    . HEPATITIS B, CHRONIC 07/28/2006    Qualifier: Diagnosis of  By: [name], Waldron Gerry    . HIV DISEASE 07/28/2006    Annotation: dx 2000 Qualifier: Diagnosis of  By: [name], Oprah Camarena    . HYPERTENSION NEC 07/07/2009    Qualifier: Diagnosis of  By: [name], Ferrell Flam    . NEPHROLITHIASIS 08/22/2006    Annotation: x2, last 1992 Qualifier: Diagnosis of  By: [name], Sun Kihn    . Obesity 09/12/2011  . PSORIASIS 08/22/2006    Qualifier: Diagnosis of  By: [name], Buford Gayler    . CERVICAL RADICULOPATHY 08/22/2006    Annotation: L arm 2002 Qualifier: Diagnosis of  By: [name], Omaira Mellen    . Carpal tunnel syndrome, right 05/31/2012   Past Surgical History  Procedure Laterality Date  . Cystectomy  2000    cyst removed off buttock cheek    reports that he quit smoking about 13 months ago. His smoking use included Cigarettes. He  smoked 0.10 packs per day. He has never used smokeless tobacco. He reports that he drinks about 1.5 ounces of alcohol per week. He reports that he does not use illicit drugs. family history includes Arthritis in his other; Cancer in his other; Colon cancer in his sister; Heart disease in his other; Hypertension in his other. Allergies  Allergen Reactions  . Lisinopril Rash   Current Outpatient Prescriptions on File Prior to Visit  Medication Sig Dispense Refill  . amLODipine (NORVASC) 10 MG tablet Take 1 tablet (10 mg total) by mouth daily.  90 tablet  3  . aspirin 81 MG tablet Take 81 mg by mouth daily.      Marland Kitchen efavirenz-emtricitabine-tenofovir (ATRIPLA) 600-200-300 MG per tablet Take 1 tablet by mouth daily.  90 tablet  3  . omeprazole (PRILOSEC) 10 MG capsule Take 10 mg by mouth daily.      . pravastatin (PRAVACHOL) 20 MG tablet Take 1 tablet (20 mg total) by mouth daily.  90 tablet  3  . scopolamine (TRANSDERM-SCOP) 1.5 MG Place 1 patch (1.5 mg total) onto the skin every 3 (three) days.  10 patch  12  . valACYclovir (VALTREX) 500 MG tablet TAKE 1 TABLET BY MOUTH    TWICE A DAY  180 tablet  2   No  current facility-administered medications on file prior to visit.   Review of Systems  Constitutional: Negative for unexpected weight change, or unusual diaphoresis  HENT: Negative for tinnitus.   Eyes: Negative for photophobia and visual disturbance.  Respiratory: Negative for choking and stridor.   Gastrointestinal: Negative for vomiting and blood in stool.  Genitourinary: Negative for hematuria and decreased urine volume.  Musculoskeletal: Negative for acute joint swelling Skin: Negative for color change and wound.  Neurological: Negative for tremors and numbness other than noted  Psychiatric/Behavioral: Negative for decreased concentration or  hyperactivity.       Objective:   Physical Exam BP 110/82  Pulse 80  Temp(Src) 98.8 F (37.1 C) (Oral)  Ht 6' (1.829 m)  Wt 219 lb (99.338  kg)  BMI 29.70 kg/m2  SpO2 96%', VS noted, mild ill Constitutional: Pt appears well-developed and well-nourished.  HENT: Head: NCAT.  Right Ear: External ear normal.  Left Ear: External ear normal.  Bilat tm's with mild erythema.  Max sinus areas non tender.  Pharynx with mild erythema, no exudate Eyes: Conjunctivae and EOM are normal. Pupils are equal, round, and reactive to light.  Neck: Normal range of motion. Neck supple.  Cardiovascular: Normal rate and regular rhythm.   Pulmonary/Chest: Effort normal and breath sounds decreased with few bilat wheezes.  Neurological: Pt is alert. Not confused  Skin: Skin is warm. No erythema.  Psychiatric: Pt behavior is normal. Thought content normal.     Assessment & Plan:

## 2013-08-22 NOTE — Assessment & Plan Note (Signed)
Mild to mod, for predpack asd,  to f/u any worsening symptoms or concerns 

## 2013-08-23 ENCOUNTER — Telehealth: Payer: Self-pay | Admitting: Internal Medicine

## 2013-08-23 NOTE — Telephone Encounter (Signed)
Relevant patient education assigned to patient using Emmi. ° °

## 2013-09-11 ENCOUNTER — Other Ambulatory Visit: Payer: BC Managed Care – PPO

## 2013-09-11 DIAGNOSIS — Z113 Encounter for screening for infections with a predominantly sexual mode of transmission: Secondary | ICD-10-CM

## 2013-09-11 DIAGNOSIS — B20 Human immunodeficiency virus [HIV] disease: Secondary | ICD-10-CM

## 2013-09-11 LAB — COMPREHENSIVE METABOLIC PANEL
ALT: 31 U/L (ref 0–53)
AST: 27 U/L (ref 0–37)
Albumin: 4.7 g/dL (ref 3.5–5.2)
Alkaline Phosphatase: 50 U/L (ref 39–117)
BILIRUBIN TOTAL: 0.4 mg/dL (ref 0.2–1.2)
BUN: 18 mg/dL (ref 6–23)
CO2: 29 mEq/L (ref 19–32)
CREATININE: 0.91 mg/dL (ref 0.50–1.35)
Calcium: 9.2 mg/dL (ref 8.4–10.5)
Chloride: 103 mEq/L (ref 96–112)
Glucose, Bld: 101 mg/dL — ABNORMAL HIGH (ref 70–99)
Potassium: 4.8 mEq/L (ref 3.5–5.3)
Sodium: 140 mEq/L (ref 135–145)
TOTAL PROTEIN: 6.7 g/dL (ref 6.0–8.3)

## 2013-09-11 LAB — CBC
HCT: 41.2 % (ref 39.0–52.0)
Hemoglobin: 14.3 g/dL (ref 13.0–17.0)
MCH: 32.3 pg (ref 26.0–34.0)
MCHC: 34.7 g/dL (ref 30.0–36.0)
MCV: 93 fL (ref 78.0–100.0)
Platelets: 150 10*3/uL (ref 150–400)
RBC: 4.43 MIL/uL (ref 4.22–5.81)
RDW: 13.4 % (ref 11.5–15.5)
WBC: 4.2 10*3/uL (ref 4.0–10.5)

## 2013-09-11 LAB — RPR

## 2013-09-12 LAB — T-HELPER CELL (CD4) - (RCID CLINIC ONLY)
CD4 % Helper T Cell: 27 % — ABNORMAL LOW (ref 33–55)
CD4 T Cell Abs: 510 /uL (ref 400–2700)

## 2013-09-12 LAB — HIV-1 RNA QUANT-NO REFLEX-BLD: HIV-1 RNA Quant, Log: <1.3 {Log} (ref <1.30)

## 2013-09-16 ENCOUNTER — Other Ambulatory Visit: Payer: Self-pay | Admitting: Internal Medicine

## 2013-09-26 ENCOUNTER — Ambulatory Visit: Payer: BC Managed Care – PPO | Admitting: Internal Medicine

## 2013-10-07 ENCOUNTER — Encounter: Payer: Self-pay | Admitting: Internal Medicine

## 2013-10-07 ENCOUNTER — Ambulatory Visit (INDEPENDENT_AMBULATORY_CARE_PROVIDER_SITE_OTHER): Payer: BC Managed Care – PPO | Admitting: Internal Medicine

## 2013-10-07 VITALS — BP 137/87 | HR 70 | Temp 97.9°F | Ht 72.0 in | Wt 225.5 lb

## 2013-10-07 DIAGNOSIS — B20 Human immunodeficiency virus [HIV] disease: Secondary | ICD-10-CM

## 2013-10-07 NOTE — Progress Notes (Signed)
Patient ID: Anthony Steele, male   DOB: [birth year], 53 y.o.   MRN: [id number] @LOGODEPT         Patient Active Problem List   Diagnosis Date Noted  . HIV DISEASE 07/28/2006    Priority: High  . Obesity 09/12/2011    Priority: Medium  . HYPERTENSION NEC 07/07/2009    Priority: Medium  . CIGARETTE SMOKER 05/08/2007    Priority: Medium  . GERD 07/28/2006    Priority: Medium  . Acute bronchitis 08/22/2013  . Wheezing 08/22/2013  . Pain of right heel 06/18/2013  . Preventative health care 05/31/2012  . Hyperlipidemia 05/31/2012  . Carpal tunnel syndrome, right 05/31/2012  . Dizziness 04/19/2011  . LOW BACK PAIN, ACUTE 05/27/2010  . GENITAL HERPES 08/22/2006  . VENEREAL WART 08/22/2006  . ANXIETY DISORDER, GENERALIZED 08/22/2006  . NEPHROLITHIASIS 08/22/2006  . PSORIASIS 08/22/2006  . DEGENERATIVE DISC DISEASE 08/22/2006  . CERVICAL RADICULOPATHY 08/22/2006  . HEPATITIS B, CHRONIC 07/28/2006  . PNEUMONIA 01/22/2006    Patient's Medications  New Prescriptions   No medications on file  Previous Medications   AMLODIPINE (NORVASC) 10 MG TABLET    Take 1 tablet (10 mg total) by mouth daily.   ASPIRIN 81 MG TABLET    Take 81 mg by mouth daily.   ATRIPLA 600-200-300 MG PER TABLET    TAKE 1 TABLET BY MOUTH    DAILY   HYDROCODONE-HOMATROPINE (HYCODAN) 5-1.5 MG/5ML SYRUP    Take 5 mLs by mouth every 6 (six) hours as needed for cough.   LEVOFLOXACIN (LEVAQUIN) 250 MG TABLET    Take 1 tablet (250 mg total) by mouth daily.   OMEPRAZOLE (PRILOSEC) 10 MG CAPSULE    Take 10 mg by mouth daily.   PRAVASTATIN (PRAVACHOL) 20 MG TABLET    TAKE 1 TABLET BY MOUTH    DAILY   PREDNISONE (DELTASONE) 10 MG TABLET    Take 1 tablet (10 mg total) by mouth daily. 1 tab by mouth per day for 5 days   SCOPOLAMINE (TRANSDERM-SCOP) 1.5 MG    Place 1 patch (1.5 mg total) onto the skin every 3 (three) days.   VALACYCLOVIR (VALTREX) 500 MG TABLET    TAKE 1 TABLET BY MOUTH    TWICE A DAY  Modified Medications    No medications on file  Discontinued Medications   No medications on file    Subjective: Anthony Steele is in for his routine visit. His usual he never misses a single dose of his Atripla. He is doing well without current concerns or complaints with regard to his HIV infection. Review of Systems: Pertinent items are noted in HPI.  Past Medical History  Diagnosis Date  . GERD (gastroesophageal reflux disease)   . HIV infection   . Hyperlipidemia   . Hypertension   . Kidney stones   . ANXIETY DISORDER, GENERALIZED 08/22/2006    Qualifier: Diagnosis of  By: [name], Tiandre Teall    . DEGENERATIVE [place] DISEASE 08/22/2006    Qualifier: Diagnosis of  By: [name], Susanna Benge    . GENITAL HERPES 08/22/2006    Qualifier: Diagnosis of  By: [name], Yoshiharu Brassell    . GERD 07/28/2006    Qualifier: Diagnosis of  By: [name], Mckinnley Cottier    . HEPATITIS B, CHRONIC 07/28/2006    Qualifier: Diagnosis of  By: [name], Taevon Aschoff    . HIV DISEASE 07/28/2006    Annotation: dx 2000 Qualifier: Diagnosis of  By: [name], Minela Bridgewater    . HYPERTENSION  NEC 07/07/2009    Qualifier: Diagnosis of  By: [name], Levelle Edelen    . NEPHROLITHIASIS 08/22/2006    Annotation: x2, last 1992 Qualifier: Diagnosis of  By: [name], Vijay Durflinger    . Obesity 09/12/2011  . PSORIASIS 08/22/2006    Qualifier: Diagnosis of  By: [name], Amerika Nourse    . CERVICAL RADICULOPATHY 08/22/2006    Annotation: L arm 2002 Qualifier: Diagnosis of  By: [name], Kalifa Cadden    . Carpal tunnel syndrome, right 05/31/2012    History  Substance Use Topics  . Smoking status: Former Smoker -- 0.10 packs/day    Types: Cigarettes    Quit date: 07/18/2012  . Smokeless tobacco: Never Used     Comment: quit over cruise over the holidays  . Alcohol Use: 1.5 oz/week    3 drink(s) per week    Family History  Problem Relation Age of Onset  . Heart disease Other   . Hypertension Other   . Arthritis Other   . Cancer Other     colon cancer  . Colon cancer Sister     Allergies   Allergen Reactions  . Lisinopril Rash    Objective: Temp: 97.9 F (36.6 C) (03/16 1500) Temp src: Oral (03/16 1500) BP: 137/87 mmHg (03/16 1500) Pulse Rate: 70 (03/16 1500) Body mass index is 30.58 kg/(m^2).  General: He is in good spirits Oral: No oropharyngeal lesions Skin: No rash Lungs: Clear Cor: Regular S1 and S2 no murmurs  Lab Results Lab Results  Component Value Date   WBC 4.2 09/11/2013   HGB 14.3 09/11/2013   HCT 41.2 09/11/2013   MCV 93.0 09/11/2013   PLT 150 09/11/2013    Lab Results  Component Value Date   CREATININE 0.91 09/11/2013   BUN 18 09/11/2013   NA 140 09/11/2013   K 4.8 09/11/2013   CL 103 09/11/2013   CO2 29 09/11/2013    Lab Results  Component Value Date   ALT 31 09/11/2013   AST 27 09/11/2013   ALKPHOS 50 09/11/2013   BILITOT 0.4 09/11/2013    Lab Results  Component Value Date   CHOL 178 06/18/2013   HDL 48.30 06/18/2013   LDLCALC 105* 06/18/2013   TRIG 126.0 06/18/2013   CHOLHDL 4 06/18/2013    Lab Results HIV 1 RNA Quant (copies/mL)  Date Value  09/11/2013 <20   03/11/2013 <20   09/04/2012 <20      CD4 T Cell Abs (/uL)  Date Value  09/11/2013 510   03/11/2013 530   09/04/2012 660      Assessment: His HIV infection remains under excellent control.  Plan: 1. Continue Atripla 2. Follow up after lab work in 6 months   Michel Bickers, MD [facility] [place] [phone] pager   [phone] cell 10/07/2013, 3:14 PM

## 2013-10-20 ENCOUNTER — Other Ambulatory Visit: Payer: Self-pay | Admitting: Internal Medicine

## 2013-10-21 ENCOUNTER — Other Ambulatory Visit: Payer: Self-pay | Admitting: *Deleted

## 2013-10-21 ENCOUNTER — Encounter: Payer: Self-pay | Admitting: Internal Medicine

## 2013-10-21 MED ORDER — PRAVASTATIN SODIUM 20 MG PO TABS: ORAL_TABLET | ORAL | Status: DC

## 2013-10-21 NOTE — Telephone Encounter (Signed)
Sent email need new rx on cholesterol medication...Anthony Steele

## 2013-10-21 NOTE — Telephone Encounter (Signed)
Refill done.  

## 2013-11-19 ENCOUNTER — Other Ambulatory Visit: Payer: Self-pay

## 2013-11-19 ENCOUNTER — Encounter: Payer: Self-pay | Admitting: Internal Medicine

## 2013-11-19 MED ORDER — PRAVASTATIN SODIUM 20 MG PO TABS: ORAL_TABLET | ORAL | Status: DC

## 2013-12-20 ENCOUNTER — Other Ambulatory Visit: Payer: Self-pay | Admitting: *Deleted

## 2013-12-20 DIAGNOSIS — B20 Human immunodeficiency virus [HIV] disease: Secondary | ICD-10-CM

## 2013-12-20 MED ORDER — EFAVIRENZ-EMTRICITAB-TENOFOVIR 600-200-300 MG PO TABS: ORAL_TABLET | ORAL | Status: DC

## 2014-04-07 ENCOUNTER — Other Ambulatory Visit: Payer: BC Managed Care – PPO

## 2014-04-11 ENCOUNTER — Telehealth: Payer: Self-pay | Admitting: Internal Medicine

## 2014-04-11 ENCOUNTER — Other Ambulatory Visit: Payer: Self-pay | Admitting: Internal Medicine

## 2014-04-11 ENCOUNTER — Other Ambulatory Visit: Payer: BC Managed Care – PPO

## 2014-04-11 DIAGNOSIS — B20 Human immunodeficiency virus [HIV] disease: Secondary | ICD-10-CM

## 2014-04-11 LAB — T-HELPER CELL (CD4) - (RCID CLINIC ONLY)
CD4 T CELL HELPER: 25 % — AB (ref 33–55)
CD4 T Cell Abs: 370 /uL — ABNORMAL LOW (ref 400–2700)

## 2014-04-11 MED ORDER — PRAVASTATIN SODIUM 20 MG PO TABS: ORAL_TABLET | ORAL | Status: DC

## 2014-04-11 NOTE — Telephone Encounter (Signed)
Patient needs refill on pravastatin to be sent to [facility] on [address].

## 2014-04-11 NOTE — Addendum Note (Signed)
Addended by: Jarrett Ables D on: 04/11/2014 09:39 AM   Modules accepted: Orders

## 2014-04-12 LAB — HIV-1 RNA QUANT-NO REFLEX-BLD
HIV 1 RNA Quant: <20 copies/mL (ref <20)
HIV-1 RNA Quant, Log: <1.3 {Log} (ref <1.30)

## 2014-04-22 ENCOUNTER — Ambulatory Visit: Payer: BC Managed Care – PPO | Admitting: Internal Medicine

## 2014-04-29 ENCOUNTER — Ambulatory Visit: Payer: BC Managed Care – PPO | Admitting: Internal Medicine

## 2014-05-13 ENCOUNTER — Ambulatory Visit (INDEPENDENT_AMBULATORY_CARE_PROVIDER_SITE_OTHER): Payer: BC Managed Care – PPO | Admitting: Internal Medicine

## 2014-05-13 ENCOUNTER — Encounter: Payer: Self-pay | Admitting: Internal Medicine

## 2014-05-13 VITALS — BP 146/97 | HR 72 | Temp 98.4°F | Ht 72.0 in | Wt 210.0 lb

## 2014-05-13 DIAGNOSIS — Z23 Encounter for immunization: Secondary | ICD-10-CM

## 2014-05-13 DIAGNOSIS — B20 Human immunodeficiency virus [HIV] disease: Secondary | ICD-10-CM | POA: Diagnosis not present

## 2014-05-13 NOTE — Progress Notes (Signed)
Patient ID: Anthony Steele, male   DOB: [birth year], 53 y.o.   MRN: [id number]         Patient Active Problem List   Diagnosis Date Noted  . Human immunodeficiency virus (HIV) disease 07/28/2006    Priority: High  . Obesity 09/12/2011    Priority: Medium  . HYPERTENSION NEC 07/07/2009    Priority: Medium  . CIGARETTE SMOKER 05/08/2007    Priority: Medium  . GERD 07/28/2006    Priority: Medium  . Acute bronchitis 08/22/2013  . Wheezing 08/22/2013  . Pain of right heel 06/18/2013  . Preventative health care 05/31/2012  . Hyperlipidemia 05/31/2012  . Carpal tunnel syndrome, right 05/31/2012  . Dizziness 04/19/2011  . LOW BACK PAIN, ACUTE 05/27/2010  . GENITAL HERPES 08/22/2006  . VENEREAL WART 08/22/2006  . ANXIETY DISORDER, GENERALIZED 08/22/2006  . NEPHROLITHIASIS 08/22/2006  . PSORIASIS 08/22/2006  . DEGENERATIVE DISC DISEASE 08/22/2006  . CERVICAL RADICULOPATHY 08/22/2006  . HEPATITIS B, CHRONIC 07/28/2006  . PNEUMONIA 01/22/2006    Patient's Medications  New Prescriptions   No medications on file  Previous Medications   AMLODIPINE (NORVASC) 10 MG TABLET    TAKE 1 TABLET (10 MG TOTAL) BY MOUTH DAILY.   ASPIRIN 81 MG TABLET    Take 81 mg by mouth daily.   CONDYLOX 0.5 % GEL       EFAVIRENZ-EMTRICITABINE-TENOFOVIR (ATRIPLA) 600-200-300 MG PER TABLET    TAKE 1 TABLET BY MOUTH    DAILY   OMEPRAZOLE (PRILOSEC) 10 MG CAPSULE    Take 10 mg by mouth daily.   PRAVASTATIN (PRAVACHOL) 20 MG TABLET    TAKE 1 TABLET BY MOUTH    DAILY   SCOPOLAMINE (TRANSDERM-SCOP) 1.5 MG    Place 1 patch (1.5 mg total) onto the skin every 3 (three) days.   VALACYCLOVIR (VALTREX) 500 MG TABLET    TAKE 1 TABLET BY MOUTH    TWICE A DAY  Modified Medications   No medications on file  Discontinued Medications   HYDROCODONE-HOMATROPINE (HYCODAN) 5-1.5 MG/5ML SYRUP    Take 5 mLs by mouth every 6 (six) hours as needed for cough.   LEVOFLOXACIN (LEVAQUIN) 250 MG TABLET    Take 1 tablet (250 mg total) by  mouth daily.   PREDNISONE (DELTASONE) 10 MG TABLET    Take 1 tablet (10 mg total) by mouth daily. 1 tab by mouth per day for 5 days    Subjective: [name] is in for his routine visit. His usual he never misses a single dose of his Atripla. He is doing well without current concerns or complaints with regard to his HIV infection. Review of Systems: Pertinent items are noted in HPI.  Past Medical History  Diagnosis Date  . GERD (gastroesophageal reflux disease)   . HIV infection   . Hyperlipidemia   . Hypertension   . Kidney stones   . ANXIETY DISORDER, GENERALIZED 08/22/2006    Qualifier: Diagnosis of  By: [name], Cortasia Screws    . DEGENERATIVE [place] DISEASE 08/22/2006    Qualifier: Diagnosis of  By: [name], Kaelani Kendrick    . GENITAL HERPES 08/22/2006    Qualifier: Diagnosis of  By: [name], Dulce Martian    . GERD 07/28/2006    Qualifier: Diagnosis of  By: [name], Tyera Hansley    . HEPATITIS B, CHRONIC 07/28/2006    Qualifier: Diagnosis of  By: [name], Harsh Trulock    . HIV DISEASE 07/28/2006    Annotation: dx 2000 Qualifier: Diagnosis of  By:  [name], Jenny Reichmann    . HYPERTENSION NEC 07/07/2009    Qualifier: Diagnosis of  By: [name], Reaghan Kawa    . NEPHROLITHIASIS 08/22/2006    Annotation: x2, last 1992 Qualifier: Diagnosis of  By: [name], Marky Buresh    . Obesity 09/12/2011  . PSORIASIS 08/22/2006    Qualifier: Diagnosis of  By: [name], Annette Liotta    . CERVICAL RADICULOPATHY 08/22/2006    Annotation: L arm 2002 Qualifier: Diagnosis of  By: [name], Joan Herschberger    . Carpal tunnel syndrome, right 05/31/2012    History  Substance Use Topics  . Smoking status: Former Smoker -- 0.10 packs/day    Types: Cigarettes    Quit date: 07/18/2012  . Smokeless tobacco: Never Used     Comment: quit over cruise over the holidays  . Alcohol Use: 1.5 oz/week    3 drink(s) per week    Family History  Problem Relation Age of Onset  . Heart disease Other   . Hypertension Other   . Arthritis Other   . Cancer Other     colon  cancer  . Colon cancer Sister     Allergies  Allergen Reactions  . Lisinopril Rash    Objective: Temp: 98.4 F (36.9 C) (10/20 0842) Temp Source: Oral (10/20 0842) BP: 146/97 mmHg (10/20 0842) Pulse Rate: 72 (10/20 0842) Body mass index is 28.47 kg/(m^2).  General: He is in good spirits Oral: No oropharyngeal lesions Skin: No rash Lungs: Clear Cor: Regular S1 and S2 no murmurs  Lab Results Lab Results  Component Value Date   WBC 4.2 09/11/2013   HGB 14.3 09/11/2013   HCT 41.2 09/11/2013   MCV 93.0 09/11/2013   PLT 150 09/11/2013    Lab Results  Component Value Date   CREATININE 0.91 09/11/2013   BUN 18 09/11/2013   NA 140 09/11/2013   K 4.8 09/11/2013   CL 103 09/11/2013   CO2 29 09/11/2013    Lab Results  Component Value Date   ALT 31 09/11/2013   AST 27 09/11/2013   ALKPHOS 50 09/11/2013   BILITOT 0.4 09/11/2013    Lab Results  Component Value Date   CHOL 178 06/18/2013   HDL 48.30 06/18/2013   LDLCALC 105* 06/18/2013   TRIG 126.0 06/18/2013   CHOLHDL 4 06/18/2013    Lab Results HIV 1 RNA Quant (copies/mL)  Date Value  04/11/2014 <20   09/11/2013 <20   03/11/2013 <20      CD4 T Cell Abs (/uL)  Date Value  04/11/2014 370*  09/11/2013 510   03/11/2013 530      Assessment: His HIV infection remains under excellent control. He has had a slight dip in his CD4 count but this is just normal variation. He has excellent sustained a viral suppression  Plan: 1. Continue Atripla 2. Follow up after lab work in [place] months   Michel Bickers, MD [facility] for [place] [phone] pager   [phone] cell 05/13/2014, 8:56 AM

## 2014-07-02 ENCOUNTER — Ambulatory Visit (INDEPENDENT_AMBULATORY_CARE_PROVIDER_SITE_OTHER): Payer: BC Managed Care – PPO | Admitting: Internal Medicine

## 2014-07-02 ENCOUNTER — Encounter: Payer: Self-pay | Admitting: Internal Medicine

## 2014-07-02 ENCOUNTER — Other Ambulatory Visit (INDEPENDENT_AMBULATORY_CARE_PROVIDER_SITE_OTHER): Payer: BC Managed Care – PPO

## 2014-07-02 VITALS — BP 128/84 | HR 80 | Temp 98.9°F | Ht 72.0 in | Wt 212.0 lb

## 2014-07-02 DIAGNOSIS — Z0000 Encounter for general adult medical examination without abnormal findings: Secondary | ICD-10-CM

## 2014-07-02 DIAGNOSIS — K219 Gastro-esophageal reflux disease without esophagitis: Secondary | ICD-10-CM

## 2014-07-02 DIAGNOSIS — J069 Acute upper respiratory infection, unspecified: Secondary | ICD-10-CM

## 2014-07-02 LAB — HEPATIC FUNCTION PANEL
ALBUMIN: 4.4 g/dL (ref 3.5–5.2)
ALT: 31 U/L (ref 0–53)
AST: 35 U/L (ref 0–37)
Alkaline Phosphatase: 65 U/L (ref 39–117)
BILIRUBIN DIRECT: 0.1 mg/dL (ref 0.0–0.3)
Total Bilirubin: 0.6 mg/dL (ref 0.2–1.2)
Total Protein: 7 g/dL (ref 6.0–8.3)

## 2014-07-02 LAB — URINALYSIS, ROUTINE W REFLEX MICROSCOPIC
Bilirubin Urine: NEGATIVE
Ketones, ur: NEGATIVE
Leukocytes, UA: NEGATIVE
NITRITE: NEGATIVE
PH: 6 (ref 5.0–8.0)
Specific Gravity, Urine: >=1.03 — AB (ref 1.000–1.030)
Total Protein, Urine: NEGATIVE
Urine Glucose: NEGATIVE
Urobilinogen, UA: 1 (ref 0.0–1.0)

## 2014-07-02 LAB — LIPID PANEL
Cholesterol: 186 mg/dL (ref 0–200)
HDL: 62 mg/dL (ref >39.00)
LDL Cholesterol: 111 mg/dL — ABNORMAL HIGH (ref 0–99)
NONHDL: 124
TRIGLYCERIDES: 67 mg/dL (ref 0.0–149.0)
Total CHOL/HDL Ratio: 3
VLDL: 13.4 mg/dL (ref 0.0–40.0)

## 2014-07-02 LAB — BASIC METABOLIC PANEL
BUN: 22 mg/dL (ref 6–23)
CALCIUM: 9.1 mg/dL (ref 8.4–10.5)
CO2: 27 meq/L (ref 19–32)
Chloride: 104 mEq/L (ref 96–112)
Creatinine, Ser: 0.9 mg/dL (ref 0.4–1.5)
GFR: 91.76 mL/min (ref >60.00)
Glucose, Bld: 102 mg/dL — ABNORMAL HIGH (ref 70–99)
Potassium: 4.3 mEq/L (ref 3.5–5.1)
Sodium: 138 mEq/L (ref 135–145)

## 2014-07-02 LAB — CBC WITH DIFFERENTIAL/PLATELET
Basophils Absolute: 0 10*3/uL (ref 0.0–0.1)
Basophils Relative: 0.7 % (ref 0.0–3.0)
EOS PCT: 4.4 % (ref 0.0–5.0)
Eosinophils Absolute: 0.2 10*3/uL (ref 0.0–0.7)
HCT: 42.4 % (ref 39.0–52.0)
Hemoglobin: 14.3 g/dL (ref 13.0–17.0)
LYMPHS PCT: 36 % (ref 12.0–46.0)
Lymphs Abs: 1.9 10*3/uL (ref 0.7–4.0)
MCHC: 33.7 g/dL (ref 30.0–36.0)
MCV: 99.4 fl (ref 78.0–100.0)
MONOS PCT: 9.6 % (ref 3.0–12.0)
Monocytes Absolute: 0.5 10*3/uL (ref 0.1–1.0)
NEUTROS ABS: 2.6 10*3/uL (ref 1.4–7.7)
NEUTROS PCT: 49.3 % (ref 43.0–77.0)
Platelets: 135 10*3/uL — ABNORMAL LOW (ref 150.0–400.0)
RBC: 4.27 Mil/uL (ref 4.22–5.81)
RDW: 12.9 % (ref 11.5–15.5)
WBC: 5.3 10*3/uL (ref 4.0–10.5)

## 2014-07-02 LAB — TSH: TSH: 2.52 u[IU]/mL (ref 0.35–4.50)

## 2014-07-02 LAB — PSA: PSA: 0.16 ng/mL (ref 0.10–4.00)

## 2014-07-02 MED ORDER — AZITHROMYCIN 250 MG PO TABS: ORAL_TABLET | ORAL | Status: DC

## 2014-07-02 MED ORDER — PANTOPRAZOLE SODIUM 40 MG PO TBEC: 40.0000 mg | DELAYED_RELEASE_TABLET | Freq: Every day | ORAL | Status: DC

## 2014-07-02 NOTE — Assessment & Plan Note (Signed)

## 2014-07-02 NOTE — Assessment & Plan Note (Signed)
Mild to mod, for antibx course,  to f/u any worsening symptoms or concerns 

## 2014-07-02 NOTE — Patient Instructions (Addendum)
OK to stop the prilosec (omeprazole)  Please take all new medication as prescribed  - the antibiotic for the infection (upper respiratory)  Please take all new medication as prescribed - the protonix  Please continue all other medications as before, and refills have been done if requested.  Please have the pharmacy call with any other refills you may need.  Please continue your efforts at being more active, low cholesterol diet, and weight control.  You are otherwise up to date with prevention measures today.  Please keep your appointments with your specialists as you may have planned  Please go to the LAB in the Basement (turn left off the elevator) for the tests to be done today  You will be contacted by phone if any changes need to be made immediately.  Otherwise, you will receive a letter about your results with an explanation, but please check with MyChart first.  Please remember to sign up for MyChart if you have not done so, as this will be important to you in the future with finding out test results, communicating by private email, and scheduling acute appointments online when needed.  Please return in 1 year for your yearly visit, or sooner if needed, with Lab testing done 3-5 days before

## 2014-07-02 NOTE — Progress Notes (Signed)
Pre visit review using our clinic review tool, if applicable. No additional management support is needed unless otherwise documented below in the visit note. 

## 2014-07-02 NOTE — Assessment & Plan Note (Signed)
Ok for change prilosec low dose to protonix 40 mg ,  to f/u any worsening symptoms or concerns

## 2014-07-02 NOTE — Progress Notes (Signed)
Subjective:    Patient ID: Anthony Steele, male    DOB: [birth year], 53 y.o.   MRN: [id number]  HPI Here for wellness and f/u;  Overall doing ok;  Pt denies CP, worsening SOB, DOE, wheezing, orthopnea, PND, worsening LE edema, palpitations, dizziness or syncope.  Pt denies neurological change such as new headache, facial or extremity weakness.  Pt denies polydipsia, polyuria, or low sugar symptoms. Pt states overall good compliance with treatment and medications, good tolerability, and has been trying to follow lower cholesterol diet.  Pt denies worsening depressive symptoms, suicidal ideation or panic. No fever, night sweats, wt loss, loss of appetite, or other constitutional symptoms.  Pt states good ability with ADL's, has low fall risk, home safety reviewed and adequate, no other significant changes in hearing or vision, and only occasionally active with exercise. Denies worsening reflux except for freq breakthrough on low dose prilosec, but no  abd pain, dysphagia, n/v, bowel change or blood. Did see derm with a lesion frozen perineum (wart). incidnetly with acute resp symtpoms with prod green cough x 2 days Past Medical History  Diagnosis Date  . GERD (gastroesophageal reflux disease)   . HIV infection   . Hyperlipidemia   . Hypertension   . Kidney stones   . ANXIETY DISORDER, GENERALIZED 08/22/2006    Qualifier: Diagnosis of  By: [name], Honest Safranek    . DEGENERATIVE [place] DISEASE 08/22/2006    Qualifier: Diagnosis of  By: [name], Tayler Heiden    . GENITAL HERPES 08/22/2006    Qualifier: Diagnosis of  By: [name], Nicholous Girgenti    . GERD 07/28/2006    Qualifier: Diagnosis of  By: [name], Anniemae Haberkorn    . HEPATITIS B, CHRONIC 07/28/2006    Qualifier: Diagnosis of  By: [name], Khaylee Mcevoy    . HIV DISEASE 07/28/2006    Annotation: dx 2000 Qualifier: Diagnosis of  By: [name], Kuuipo Anzaldo    . HYPERTENSION NEC 07/07/2009    Qualifier: Diagnosis of  By: [name], Deondrick Searls    . NEPHROLITHIASIS 08/22/2006   Annotation: x2, last 1992 Qualifier: Diagnosis of  By: [name], Nataleigh Griffin    . Obesity 09/12/2011  . PSORIASIS 08/22/2006    Qualifier: Diagnosis of  By: [name], Cathlene Gardella    . CERVICAL RADICULOPATHY 08/22/2006    Annotation: L arm 2002 Qualifier: Diagnosis of  By: [name], Kadien Lineman    . Carpal tunnel syndrome, right 05/31/2012   Past Surgical History  Procedure Laterality Date  . Cystectomy  2000    cyst removed off buttock cheek    reports that he quit smoking about 1 years ago. His smoking use included Cigarettes. He smoked 0.10 packs per day. He has never used smokeless tobacco. He reports that he drinks about 1.5 oz of alcohol per week. He reports that he does not use illicit drugs. family history includes Arthritis in his other; Cancer in his other; Colon cancer in his sister; Heart disease in his other; Hypertension in his other. Allergies  Allergen Reactions  . Lisinopril Rash   Current Outpatient Prescriptions on File Prior to Visit  Medication Sig Dispense Refill  . amLODipine (NORVASC) 10 MG tablet TAKE 1 TABLET (10 MG TOTAL) BY MOUTH DAILY. 90 tablet 1  . aspirin 81 MG tablet Take 81 mg by mouth daily.    . CONDYLOX 0.5 % gel     . efavirenz-emtricitabine-tenofovir (ATRIPLA) 600-200-300 MG per tablet TAKE 1 TABLET BY MOUTH    DAILY 90 tablet  3  . pravastatin (PRAVACHOL) 20 MG tablet TAKE 1 TABLET BY MOUTH    DAILY 90 tablet 3  . scopolamine (TRANSDERM-SCOP) 1.5 MG Place 1 patch (1.5 mg total) onto the skin every 3 (three) days. 10 patch 12  . valACYclovir (VALTREX) 500 MG tablet TAKE 1 TABLET BY MOUTH    TWICE A DAY 180 tablet 2   No current facility-administered medications on file prior to visit.    Review of Systems Constitutional: Negative for increased diaphoresis, other activity, appetite or other siginficant weight change  HENT: Negative for worsening hearing loss, ear pain, facial swelling, mouth sores and neck stiffness.   Eyes: Negative for other worsening pain,  redness or visual disturbance.  Respiratory: Negative for shortness of breath and wheezing.   Cardiovascular: Negative for chest pain and palpitations.  Gastrointestinal: Negative for diarrhea, blood in stool, abdominal distention or other pain Genitourinary: Negative for hematuria, flank pain or change in urine volume.  Musculoskeletal: Negative for myalgias or other joint complaints.  Skin: Negative for color change and wound.  Neurological: Negative for syncope and numbness. other than noted Hematological: Negative for adenopathy. or other swelling Psychiatric/Behavioral: Negative for hallucinations, self-injury, decreased concentration or other worsening agitation.      Objective:   Physical Exam BP 128/84 mmHg  Pulse 80  Temp(Src) 98.9 F (37.2 C) (Oral)  Ht 6' (1.829 m)  Wt 212 lb (96.163 kg)  BMI 28.75 kg/m2  SpO2 97% VS noted, not ill appearing, mild ill Constitutional: Pt is oriented to person, place, and time. Appears well-developed and well-nourished.  Head: Normocephalic and atraumatic.  Right Ear: External ear normal.  Left Ear: External ear normal.  Nose: Nose normal.  Mouth/Throat: Oropharynx is clear and moist.  Bilat tm's with mild erythema.  Max sinus areas non tender.  Pharynx with mild erythema, no exudate Eyes: Conjunctivae and EOM are normal. Pupils are equal, round, and reactive to light.  Neck: Normal range of motion. Neck supple. No JVD present. No tracheal deviation present.  Cardiovascular: Normal rate, regular rhythm, normal heart sounds and intact distal pulses.   Pulmonary/Chest: Effort normal and breath sounds without rales or wheezing  Abdominal: Soft. Bowel sounds are normal. NT. No HSM  Musculoskeletal: Normal range of motion. Exhibits no edema.  Lymphadenopathy:  Has no cervical adenopathy.  Neurological: Pt is alert and oriented to person, place, and time. Pt has normal reflexes. No cranial nerve deficit. Motor grossly intact Skin: Skin is  warm and dry. No rash noted.  Psychiatric:  Has normal mood and affect. Behavior is normal.    Assessment & Plan:

## 2014-09-22 ENCOUNTER — Other Ambulatory Visit: Payer: Self-pay | Admitting: Internal Medicine

## 2014-10-16 ENCOUNTER — Other Ambulatory Visit: Payer: Self-pay | Admitting: Specialist

## 2014-10-16 DIAGNOSIS — M542 Cervicalgia: Secondary | ICD-10-CM

## 2014-10-26 ENCOUNTER — Ambulatory Visit
Admission: RE | Admit: 2014-10-26 | Discharge: 2014-10-26 | Disposition: A | Discharge status: Home / Self Care | Payer: Self-pay | Source: Ambulatory Visit | Attending: Specialist | Admitting: Specialist

## 2014-10-26 DIAGNOSIS — M542 Cervicalgia: Secondary | ICD-10-CM

## 2014-10-30 ENCOUNTER — Other Ambulatory Visit: Payer: BLUE CROSS/BLUE SHIELD

## 2014-10-30 DIAGNOSIS — B20 Human immunodeficiency virus [HIV] disease: Secondary | ICD-10-CM

## 2014-10-30 LAB — CBC
HCT: 42.4 % (ref 39.0–52.0)
Hemoglobin: 14.1 g/dL (ref 13.0–17.0)
MCH: 32.9 pg (ref 26.0–34.0)
MCHC: 33.3 g/dL (ref 30.0–36.0)
MCV: 98.8 fL (ref 78.0–100.0)
MPV: 10.4 fL (ref 8.6–12.4)
Platelets: 148 10*3/uL — ABNORMAL LOW (ref 150–400)
RBC: 4.29 MIL/uL (ref 4.22–5.81)
RDW: 13.4 % (ref 11.5–15.5)
WBC: 4.5 10*3/uL (ref 4.0–10.5)

## 2014-10-30 LAB — COMPREHENSIVE METABOLIC PANEL
ALT: 37 U/L (ref 0–53)
AST: 33 U/L (ref 0–37)
Albumin: 4.4 g/dL (ref 3.5–5.2)
Alkaline Phosphatase: 57 U/L (ref 39–117)
BUN: 17 mg/dL (ref 6–23)
CHLORIDE: 103 meq/L (ref 96–112)
CO2: 26 meq/L (ref 19–32)
Calcium: 9.4 mg/dL (ref 8.4–10.5)
Creat: 0.83 mg/dL (ref 0.50–1.35)
Glucose, Bld: 104 mg/dL — ABNORMAL HIGH (ref 70–99)
Potassium: 4.8 mEq/L (ref 3.5–5.3)
Sodium: 139 mEq/L (ref 135–145)
TOTAL PROTEIN: 6.8 g/dL (ref 6.0–8.3)
Total Bilirubin: 0.5 mg/dL (ref 0.2–1.2)

## 2014-10-31 LAB — HIV-1 RNA QUANT-NO REFLEX-BLD: HIV 1 RNA Quant: <20 copies/mL (ref <20)

## 2014-10-31 LAB — T-HELPER CELL (CD4) - (RCID CLINIC ONLY)
CD4 T CELL ABS: 440 /uL (ref 400–2700)
CD4 T CELL HELPER: 26 % — AB (ref 33–55)

## 2014-11-13 ENCOUNTER — Ambulatory Visit: Payer: BC Managed Care – PPO | Admitting: Internal Medicine

## 2014-11-27 ENCOUNTER — Ambulatory Visit: Payer: BLUE CROSS/BLUE SHIELD | Admitting: Internal Medicine

## 2014-12-04 ENCOUNTER — Other Ambulatory Visit: Payer: Self-pay | Admitting: *Deleted

## 2014-12-04 DIAGNOSIS — B20 Human immunodeficiency virus [HIV] disease: Secondary | ICD-10-CM

## 2014-12-04 MED ORDER — EFAVIRENZ-EMTRICITAB-TENOFOVIR 600-200-300 MG PO TABS: ORAL_TABLET | ORAL | Status: DC

## 2014-12-23 ENCOUNTER — Ambulatory Visit (INDEPENDENT_AMBULATORY_CARE_PROVIDER_SITE_OTHER): Payer: BLUE CROSS/BLUE SHIELD | Admitting: Internal Medicine

## 2014-12-23 DIAGNOSIS — B20 Human immunodeficiency virus [HIV] disease: Secondary | ICD-10-CM

## 2014-12-23 MED ORDER — VALACYCLOVIR HCL 500 MG PO TABS: 500.0000 mg | ORAL_TABLET | Freq: Two times a day (BID) | ORAL | Status: DC

## 2014-12-23 NOTE — Progress Notes (Signed)
Patient ID: Anthony Steele, male   DOB: 1960-12-12, 54 y.o.   MRN: [id number]          Patient Active Problem List   Diagnosis Date Noted  . Human immunodeficiency virus (HIV) disease 07/28/2006    Priority: High  . Obesity 09/12/2011    Priority: Medium  . HYPERTENSION NEC 07/07/2009    Priority: Medium  . CIGARETTE SMOKER 05/08/2007    Priority: Medium  . GERD 07/28/2006    Priority: Medium  . Acute upper respiratory infection 07/02/2014  . Acute bronchitis 08/22/2013  . Wheezing 08/22/2013  . Pain of right heel 06/18/2013  . Preventative health care 05/31/2012  . Hyperlipidemia 05/31/2012  . Carpal tunnel syndrome, right 05/31/2012  . Dizziness 04/19/2011  . LOW BACK PAIN, ACUTE 05/27/2010  . GENITAL HERPES 08/22/2006  . VENEREAL WART 08/22/2006  . ANXIETY DISORDER, GENERALIZED 08/22/2006  . NEPHROLITHIASIS 08/22/2006  . PSORIASIS 08/22/2006  . DEGENERATIVE DISC DISEASE 08/22/2006  . CERVICAL RADICULOPATHY 08/22/2006  . HEPATITIS B, CHRONIC 07/28/2006  . PNEUMONIA 01/22/2006    Patient's Medications  New Prescriptions   No medications on file  Previous Medications   AMLODIPINE (NORVASC) 10 MG TABLET    TAKE 1 TABLET (10 MG TOTAL) BY MOUTH DAILY.   AMLODIPINE (NORVASC) 10 MG TABLET    TAKE 1 TABLET (10 MG TOTAL) BY MOUTH DAILY.   ASPIRIN 81 MG TABLET    Take 81 mg by mouth daily.   CONDYLOX 0.5 % GEL       EFAVIRENZ-EMTRICITABINE-TENOFOVIR (ATRIPLA) 600-200-300 MG PER TABLET    TAKE 1 TABLET BY MOUTH    DAILY   PANTOPRAZOLE (PROTONIX) 40 MG TABLET    Take 1 tablet (40 mg total) by mouth daily.   PRAVASTATIN (PRAVACHOL) 20 MG TABLET    TAKE 1 TABLET BY MOUTH    DAILY   SCOPOLAMINE (TRANSDERM-SCOP) 1.5 MG    Place 1 patch (1.5 mg total) onto the skin every 3 (three) days.  Modified Medications   Modified Medication Previous Medication   VALACYCLOVIR (VALTREX) 500 MG TABLET valACYclovir (VALTREX) 500 MG tablet      Take 1 tablet (500 mg total) by mouth 2 (two)  times daily.    TAKE 1 TABLET BY MOUTH    TWICE A DAY  Discontinued Medications   AZITHROMYCIN (ZITHROMAX Z-PAK) 250 MG TABLET    Use as directed    Subjective: Anthony Steele is in for his routine HIV follow-up. As usual he never misses a single dose of his Atripla. He is tolerating it well. He has been seeing Dr. Sharyne Richters for degenerative disc disease of his cervical spine and right wrist carpal tunnel syndrome. Both are improving with physical therapy.  Review of Systems: Pertinent items are noted in HPI.  Past Medical History  Diagnosis Date  . GERD (gastroesophageal reflux disease)   . HIV infection   . Hyperlipidemia   . Hypertension   . Kidney stones   . ANXIETY DISORDER, GENERALIZED 08/22/2006    Qualifier: Diagnosis of  By: [name], Hisham Provence    . DEGENERATIVE [place] DISEASE 08/22/2006    Qualifier: Diagnosis of  By: [name], Siniya Lichty    . GENITAL HERPES 08/22/2006    Qualifier: Diagnosis of  By: [name], Monesha Monreal    . GERD 07/28/2006    Qualifier: Diagnosis of  By: [name], Glendale Youngblood    . HEPATITIS B, CHRONIC 07/28/2006    Qualifier: Diagnosis of  By: [name], Santresa Levett    .  HIV DISEASE 07/28/2006    Annotation: dx 2000 Qualifier: Diagnosis of  By: [name], Kolbie Lepkowski    . HYPERTENSION NEC 07/07/2009    Qualifier: Diagnosis of  By: [name], Hilton Saephan    . NEPHROLITHIASIS 08/22/2006    Annotation: x2, last 1992 Qualifier: Diagnosis of  By: [name], Jamaurion Slemmer    . Obesity 09/12/2011  . PSORIASIS 08/22/2006    Qualifier: Diagnosis of  By: [name], Tomasina Keasling    . CERVICAL RADICULOPATHY 08/22/2006    Annotation: L arm 2002 Qualifier: Diagnosis of  By: [name], Rajohn Henery    . Carpal tunnel syndrome, right 05/31/2012    History  Substance Use Topics  . Smoking status: Former Smoker -- 0.10 packs/day    Types: Cigarettes    Quit date: 07/18/2012  . Smokeless tobacco: Never Used     Comment: quit over cruise over the holidays  . Alcohol Use: 1.5 oz/week    3 drink(s) per week    Family History    Problem Relation Age of Onset  . Heart disease Other   . Hypertension Other   . Arthritis Other   . Cancer Other     colon cancer  . Colon cancer Sister     Allergies  Allergen Reactions  . Lisinopril Rash    Objective: Temp: 98.5 F (36.9 C) (05/31 0909) Temp Source: Oral (05/31 0909) BP: 159/105 mmHg (05/31 0915) Pulse Rate: 85 (05/31 0915) Body mass index is 30.85 kg/(m^2).  General: He is in good spirits Oral: No oropharyngeal lesions Skin: No rash Lungs: Clear Cor: Regular S1 and S2 with no murmurs   Lab Results Lab Results  Component Value Date   WBC 4.5 10/30/2014   HGB 14.1 10/30/2014   HCT 42.4 10/30/2014   MCV 98.8 10/30/2014   PLT 148* 10/30/2014    Lab Results  Component Value Date   CREATININE 0.83 10/30/2014   BUN 17 10/30/2014   NA 139 10/30/2014   K 4.8 10/30/2014   CL 103 10/30/2014   CO2 26 10/30/2014    Lab Results  Component Value Date   ALT 37 10/30/2014   AST 33 10/30/2014   ALKPHOS 57 10/30/2014   BILITOT 0.5 10/30/2014    Lab Results  Component Value Date   CHOL 186 07/02/2014   HDL 62.00 07/02/2014   LDLCALC 111* 07/02/2014   TRIG 67.0 07/02/2014   CHOLHDL 3 07/02/2014    Lab Results HIV 1 RNA QUANT (copies/mL)  Date Value  10/30/2014 <20  04/11/2014 <20  09/11/2013 <20   CD4 T CELL ABS (/uL)  Date Value  10/30/2014 440  04/11/2014 370*  09/11/2013 510     Assessment: His HIV infection remains under excellent control.  Plan: 1. Continue Atripla 2. Follow-up after lab work in [place] months   Michel Bickers, MD [facility] [place] [phone] pager   [phone] cell 12/23/2014, 9:33 AM

## 2015-01-01 ENCOUNTER — Telehealth: Payer: Self-pay | Admitting: *Deleted

## 2015-01-01 ENCOUNTER — Other Ambulatory Visit: Payer: Self-pay | Admitting: *Deleted

## 2015-01-01 DIAGNOSIS — A6000 Herpesviral infection of urogenital system, unspecified: Secondary | ICD-10-CM

## 2015-01-01 MED ORDER — VALACYCLOVIR HCL 500 MG PO TABS: 500.0000 mg | ORAL_TABLET | Freq: Two times a day (BID) | ORAL | Status: DC

## 2015-01-01 NOTE — Telephone Encounter (Signed)
Uses [facility] for his non-HIV medications

## 2015-01-01 NOTE — Telephone Encounter (Signed)
Valtrex prescription from 12/23/14 OV was sent in error to the [place].  It should have been sent to the [facility] in [place], [place].  RN left the pt a message to call back to let [facility] know if the patient's insurance requires non-HIV medications the pt takes on a daily basis needs to be sent to mail order or if he can use a local pharmacy.

## 2015-05-27 ENCOUNTER — Other Ambulatory Visit: Payer: BLUE CROSS/BLUE SHIELD

## 2015-06-01 ENCOUNTER — Other Ambulatory Visit: Payer: Self-pay | Admitting: *Deleted

## 2015-06-01 DIAGNOSIS — B20 Human immunodeficiency virus [HIV] disease: Secondary | ICD-10-CM

## 2015-06-01 MED ORDER — EFAVIRENZ-EMTRICITAB-TENOFOVIR 600-200-300 MG PO TABS: ORAL_TABLET | ORAL | Status: DC

## 2015-06-02 ENCOUNTER — Other Ambulatory Visit: Payer: Self-pay | Admitting: Internal Medicine

## 2015-06-03 ENCOUNTER — Other Ambulatory Visit: Payer: Self-pay | Admitting: *Deleted

## 2015-06-03 DIAGNOSIS — B20 Human immunodeficiency virus [HIV] disease: Secondary | ICD-10-CM

## 2015-06-03 MED ORDER — EFAVIRENZ-EMTRICITAB-TENOFOVIR 600-200-300 MG PO TABS: ORAL_TABLET | ORAL | Status: DC

## 2015-06-10 ENCOUNTER — Other Ambulatory Visit: Payer: Self-pay | Admitting: *Deleted

## 2015-06-10 ENCOUNTER — Ambulatory Visit: Payer: BLUE CROSS/BLUE SHIELD | Admitting: Internal Medicine

## 2015-06-10 ENCOUNTER — Other Ambulatory Visit: Payer: BLUE CROSS/BLUE SHIELD

## 2015-06-10 DIAGNOSIS — B20 Human immunodeficiency virus [HIV] disease: Secondary | ICD-10-CM

## 2015-06-10 MED ORDER — EFAVIRENZ-EMTRICITAB-TENOFOVIR 600-200-300 MG PO TABS: ORAL_TABLET | ORAL | Status: DC

## 2015-06-11 LAB — T-HELPER CELL (CD4) - (RCID CLINIC ONLY)
CD4 % Helper T Cell: 26 % — ABNORMAL LOW (ref 33–55)
CD4 T CELL ABS: 470 /uL (ref 400–2700)

## 2015-06-12 LAB — HIV-1 RNA QUANT-NO REFLEX-BLD: HIV 1 RNA Quant: <20 copies/mL (ref <20)

## 2015-06-29 ENCOUNTER — Ambulatory Visit (INDEPENDENT_AMBULATORY_CARE_PROVIDER_SITE_OTHER): Payer: BLUE CROSS/BLUE SHIELD | Admitting: Internal Medicine

## 2015-06-29 ENCOUNTER — Encounter: Payer: Self-pay | Admitting: Internal Medicine

## 2015-06-29 VITALS — BP 156/99 | HR 78 | Temp 98.2°F | Wt 221.8 lb

## 2015-06-29 DIAGNOSIS — Z23 Encounter for immunization: Secondary | ICD-10-CM | POA: Diagnosis not present

## 2015-06-29 DIAGNOSIS — B20 Human immunodeficiency virus [HIV] disease: Secondary | ICD-10-CM

## 2015-06-29 MED ORDER — ELVITEG-COBIC-EMTRICIT-TENOFAF 150-150-200-10 MG PO TABS: 1.0000 | ORAL_TABLET | Freq: Every day | ORAL | Status: DC

## 2015-06-29 NOTE — Assessment & Plan Note (Signed)
His HIV infection remains under perfect control. I talked him about newer and safer antiretroviral alternatives. I will switch him to [facility] once daily with a meal. He will follow-up after lab work in 6 months.

## 2015-06-29 NOTE — Progress Notes (Signed)
Patient Active Problem List   Diagnosis Date Noted  . Human immunodeficiency virus (HIV) disease ([place]) 07/28/2006    Priority: High  . Obesity 09/12/2011    Priority: Medium  . HYPERTENSION NEC 07/07/2009    Priority: Medium  . CIGARETTE SMOKER 05/08/2007    Priority: Medium  . GERD 07/28/2006    Priority: Medium  . Acute upper respiratory infection 07/02/2014  . Acute bronchitis 08/22/2013  . Wheezing 08/22/2013  . Pain of right heel 06/18/2013  . Preventative health care 05/31/2012  . Hyperlipidemia 05/31/2012  . Carpal tunnel syndrome, right 05/31/2012  . Dizziness 04/19/2011  . LOW BACK PAIN, ACUTE 05/27/2010  . GENITAL HERPES 08/22/2006  . VENEREAL WART 08/22/2006  . ANXIETY DISORDER, GENERALIZED 08/22/2006  . NEPHROLITHIASIS 08/22/2006  . PSORIASIS 08/22/2006  . DEGENERATIVE DISC DISEASE 08/22/2006  . CERVICAL RADICULOPATHY 08/22/2006  . HEPATITIS B, CHRONIC 07/28/2006  . PNEUMONIA 01/22/2006    Patient's Medications  New Prescriptions   ELVITEGRAVIR-COBICISTAT-EMTRICITABINE-TENOFOVIR (GENVOYA) 150-150-200-10 MG TABS TABLET    Take 1 tablet by mouth daily with breakfast.  Previous Medications   AMLODIPINE (NORVASC) 10 MG TABLET    TAKE 1 TABLET (10 MG TOTAL) BY MOUTH DAILY.   AMLODIPINE (NORVASC) 10 MG TABLET    TAKE 1 TABLET (10 MG TOTAL) BY MOUTH DAILY.   ASPIRIN 81 MG TABLET    Take 81 mg by mouth daily.   CONDYLOX 0.5 % GEL       PANTOPRAZOLE (PROTONIX) 40 MG TABLET    TAKE 1 TABLET (40 MG TOTAL) BY MOUTH DAILY.   PRAVASTATIN (PRAVACHOL) 20 MG TABLET    TAKE 1 TABLET BY MOUTH DAILY   SCOPOLAMINE (TRANSDERM-SCOP) 1.5 MG    Place 1 patch (1.5 mg total) onto the skin every 3 (three) days.   VALACYCLOVIR (VALTREX) 500 MG TABLET    Take 1 tablet (500 mg total) by mouth 2 (two) times daily.  Modified Medications   No medications on file  Discontinued Medications   EFAVIRENZ-EMTRICITABINE-TENOFOVIR (ATRIPLA) 600-200-300 MG TABLET    TAKE 1 TABLET BY  MOUTH    DAILY    Subjective: Anthony Steele is in for his routine HIV follow-up visit. He is doing well and as usual never misses any doses of his Atripla. He does have frequent crazy dreams but otherwise tolerates Atripla well. He just recently returned from a weeklong vacation in the [place]. He had a little bit of soreness in the left side of his neck after returning but it resolved spontaneously.   Review of Systems: Review of Systems  Constitutional: Negative.   Respiratory: Negative for cough and sputum production.   Cardiovascular: Negative for chest pain.  Gastrointestinal: Negative for nausea, vomiting and diarrhea.  Skin: Negative for rash.    Past Medical History  Diagnosis Date  . GERD (gastroesophageal reflux disease)   . HIV infection ([place])   . Hyperlipidemia   . Hypertension   . Kidney stones   . ANXIETY DISORDER, GENERALIZED 08/22/2006    Qualifier: Diagnosis of  By: [name], Hulet Ehrmann    . DEGENERATIVE [place] DISEASE 08/22/2006    Qualifier: Diagnosis of  By: [name], Mele Sylvester    . GENITAL HERPES 08/22/2006    Qualifier: Diagnosis of  By: [name], Janina Trafton    . GERD 07/28/2006    Qualifier: Diagnosis of  By: [name], Anneta Rounds    . HEPATITIS B, CHRONIC 07/28/2006    Qualifier: Diagnosis of  By:  [name], Jenny Reichmann    . HIV DISEASE 07/28/2006    Annotation: dx 2000 Qualifier: Diagnosis of  By: [name], Rachelanne Whidby    . HYPERTENSION NEC 07/07/2009    Qualifier: Diagnosis of  By: [name], Ki Corbo    . NEPHROLITHIASIS 08/22/2006    Annotation: x2, last 1992 Qualifier: Diagnosis of  By: [name], Jamario Colina    . Obesity 09/12/2011  . PSORIASIS 08/22/2006    Qualifier: Diagnosis of  By: [name], Opha Mcghee    . CERVICAL RADICULOPATHY 08/22/2006    Annotation: L arm 2002 Qualifier: Diagnosis of  By: [name], Adyn Serna    . Carpal tunnel syndrome, right 05/31/2012    Social History  Substance Use Topics  . Smoking status: Former Smoker -- 0.10 packs/day    Types: Cigarettes    Quit date:  07/18/2012  . Smokeless tobacco: Never Used     Comment: quit over cruise over the holidays  . Alcohol Use: 1.5 oz/week    3 drink(s) per week    Family History  Problem Relation Age of Onset  . Heart disease Other   . Hypertension Other   . Arthritis Other   . Cancer Other     colon cancer  . Colon cancer Sister     Allergies  Allergen Reactions  . Lisinopril Rash    Objective:  Filed Vitals:   06/29/15 1344  BP: 156/99  Pulse: 78  Temp: 98.2 F (36.8 C)  TempSrc: Oral  Weight: 221 lb 12 oz (100.585 kg)   Body mass index is 30.07 kg/(m^2).  Physical Exam  Constitutional: He is oriented to person, place, and time.  He is well dressed and in good spirits as usual.  Eyes: Conjunctivae are normal.  Neck: Normal range of motion. Neck supple.  No tenderness with palpation.  Cardiovascular: Normal rate and regular rhythm.   No murmur heard. Pulmonary/Chest: Breath sounds normal.  Abdominal: Soft. He exhibits no mass. There is no tenderness.  Musculoskeletal: Normal range of motion.  Lymphadenopathy:    He has no cervical adenopathy.  Neurological: He is alert and oriented to person, place, and time.  Skin: No rash noted.  Psychiatric: Mood and affect normal.    Lab Results Lab Results  Component Value Date   WBC 4.5 10/30/2014   HGB 14.1 10/30/2014   HCT 42.4 10/30/2014   MCV 98.8 10/30/2014   PLT 148* 10/30/2014    Lab Results  Component Value Date   CREATININE 0.83 10/30/2014   BUN 17 10/30/2014   NA 139 10/30/2014   K 4.8 10/30/2014   CL 103 10/30/2014   CO2 26 10/30/2014    Lab Results  Component Value Date   ALT 37 10/30/2014   AST 33 10/30/2014   ALKPHOS 57 10/30/2014   BILITOT 0.5 10/30/2014    Lab Results  Component Value Date   CHOL 186 07/02/2014   HDL 62.00 07/02/2014   LDLCALC 111* 07/02/2014   TRIG 67.0 07/02/2014   CHOLHDL 3 07/02/2014    Lab Results HIV 1 RNA QUANT (copies/mL)  Date Value  06/10/2015 <20  10/30/2014  <20  04/11/2014 <20   CD4 T CELL ABS (/uL)  Date Value  06/10/2015 470  10/30/2014 440  04/11/2014 370*      Problem List Items Addressed This Visit      High   Human immunodeficiency virus (HIV) disease ([place])    His HIV infection remains under perfect control. I talked him about newer and  safer antiretroviral alternatives. I will switch him to [facility] once daily with a meal. He will follow-up after lab work in 6 months.      Relevant Medications   elvitegravir-cobicistat-emtricitabine-tenofovir (GENVOYA) 150-150-200-10 MG TABS tablet   Other Relevant Orders   T-helper cell (CD4)- ([facility] clinic only)   HIV 1 RNA quant-no reflex-bld   CBC   Comprehensive metabolic panel   Lipid panel   RPR        Michel Bickers, MD [facility] [place] [phone] pager   [phone] cell 06/29/2015, 2:02 PM

## 2015-07-03 ENCOUNTER — Other Ambulatory Visit (INDEPENDENT_AMBULATORY_CARE_PROVIDER_SITE_OTHER): Payer: BLUE CROSS/BLUE SHIELD

## 2015-07-03 ENCOUNTER — Ambulatory Visit (INDEPENDENT_AMBULATORY_CARE_PROVIDER_SITE_OTHER): Payer: BLUE CROSS/BLUE SHIELD | Admitting: Internal Medicine

## 2015-07-03 ENCOUNTER — Encounter: Payer: Self-pay | Admitting: Internal Medicine

## 2015-07-03 ENCOUNTER — Other Ambulatory Visit: Payer: Self-pay | Admitting: Internal Medicine

## 2015-07-03 VITALS — BP 164/92 | HR 81 | Temp 98.8°F | Ht 72.0 in | Wt 221.0 lb

## 2015-07-03 DIAGNOSIS — I10 Essential (primary) hypertension: Secondary | ICD-10-CM | POA: Diagnosis not present

## 2015-07-03 DIAGNOSIS — Z0000 Encounter for general adult medical examination without abnormal findings: Secondary | ICD-10-CM

## 2015-07-03 LAB — URINALYSIS, ROUTINE W REFLEX MICROSCOPIC
Ketones, ur: NEGATIVE
LEUKOCYTES UA: NEGATIVE
NITRITE: NEGATIVE
PH: 6 (ref 5.0–8.0)
Specific Gravity, Urine: >=1.03 — AB (ref 1.000–1.030)
TOTAL PROTEIN, URINE-UPE24: NEGATIVE
URINE GLUCOSE: NEGATIVE
UROBILINOGEN UA: 0.2 (ref 0.0–1.0)

## 2015-07-03 LAB — BASIC METABOLIC PANEL
BUN: 23 mg/dL (ref 6–23)
CHLORIDE: 106 meq/L (ref 96–112)
CO2: 29 mEq/L (ref 19–32)
CREATININE: 0.98 mg/dL (ref 0.40–1.50)
Calcium: 9.6 mg/dL (ref 8.4–10.5)
GFR: 84.98 mL/min (ref >60.00)
Glucose, Bld: 105 mg/dL — ABNORMAL HIGH (ref 70–99)
Potassium: 4.3 mEq/L (ref 3.5–5.1)
Sodium: 143 mEq/L (ref 135–145)

## 2015-07-03 LAB — CBC WITH DIFFERENTIAL/PLATELET
BASOS ABS: 0 10*3/uL (ref 0.0–0.1)
Basophils Relative: 0.8 % (ref 0.0–3.0)
EOS PCT: 1 % (ref 0.0–5.0)
Eosinophils Absolute: 0.1 10*3/uL (ref 0.0–0.7)
HCT: 41.4 % (ref 39.0–52.0)
HEMOGLOBIN: 14.1 g/dL (ref 13.0–17.0)
LYMPHS PCT: 34 % (ref 12.0–46.0)
Lymphs Abs: 1.9 10*3/uL (ref 0.7–4.0)
MCHC: 34 g/dL (ref 30.0–36.0)
MCV: 98.1 fl (ref 78.0–100.0)
MONOS PCT: 8.4 % (ref 3.0–12.0)
Monocytes Absolute: 0.5 10*3/uL (ref 0.1–1.0)
Neutro Abs: 3.1 10*3/uL (ref 1.4–7.7)
Neutrophils Relative %: 55.8 % (ref 43.0–77.0)
Platelets: 153 10*3/uL (ref 150.0–400.0)
RBC: 4.22 Mil/uL (ref 4.22–5.81)
RDW: 13.4 % (ref 11.5–15.5)
WBC: 5.6 10*3/uL (ref 4.0–10.5)

## 2015-07-03 LAB — TSH: TSH: 2.78 u[IU]/mL (ref 0.35–4.50)

## 2015-07-03 LAB — LIPID PANEL
CHOL/HDL RATIO: 4
Cholesterol: 206 mg/dL — ABNORMAL HIGH (ref 0–200)
HDL: 54.8 mg/dL (ref >39.00)
LDL Cholesterol: 134 mg/dL — ABNORMAL HIGH (ref 0–99)
NONHDL: 151.18
Triglycerides: 84 mg/dL (ref 0.0–149.0)
VLDL: 16.8 mg/dL (ref 0.0–40.0)

## 2015-07-03 LAB — HEPATIC FUNCTION PANEL
ALT: 41 U/L (ref 0–53)
AST: 34 U/L (ref 0–37)
Albumin: 4.8 g/dL (ref 3.5–5.2)
Alkaline Phosphatase: 65 U/L (ref 39–117)
BILIRUBIN DIRECT: 0.1 mg/dL (ref 0.0–0.3)
BILIRUBIN TOTAL: 0.4 mg/dL (ref 0.2–1.2)
Total Protein: 7.1 g/dL (ref 6.0–8.3)

## 2015-07-03 LAB — PSA: PSA: 1.32 ng/mL (ref 0.10–4.00)

## 2015-07-03 MED ORDER — PANTOPRAZOLE SODIUM 40 MG PO TBEC: DELAYED_RELEASE_TABLET | ORAL | Status: DC

## 2015-07-03 MED ORDER — PRAVASTATIN SODIUM 20 MG PO TABS: 20.0000 mg | ORAL_TABLET | Freq: Every day | ORAL | Status: DC

## 2015-07-03 MED ORDER — OLMESARTAN MEDOXOMIL 20 MG PO TABS: 20.0000 mg | ORAL_TABLET | Freq: Every day | ORAL | Status: DC

## 2015-07-03 MED ORDER — PRAVASTATIN SODIUM 40 MG PO TABS: 40.0000 mg | ORAL_TABLET | Freq: Every day | ORAL | Status: DC

## 2015-07-03 NOTE — Progress Notes (Signed)
Pre visit review using our clinic review tool, if applicable. No additional management support is needed unless otherwise documented below in the visit note. 

## 2015-07-03 NOTE — Progress Notes (Signed)
Subjective:    Patient ID: Anthony Steele, male    DOB: Jun 23, 1961, 54 y.o.   MRN: [id number]  HPI  Here for wellness and f/u;  Overall doing ok;  Pt denies Chest pain, worsening SOB, DOE, wheezing, orthopnea, PND, worsening LE edema, palpitations, dizziness or syncope.  Pt denies neurological change such as new headache, facial or extremity weakness.  Pt denies polydipsia, polyuria, or low sugar symptoms. Pt states overall good compliance with treatment and medications, good tolerability, and has been trying to follow appropriate diet.  Pt denies worsening depressive symptoms, suicidal ideation or panic. No fever, night sweats, wt loss, loss of appetite, or other constitutional symptoms.  Pt states good ability with ADL's, has low fall risk, home safety reviewed and adequate, no other significant changes in hearing or vision, and only occasionally active with exercise. BP Readings from Last 3 Encounters:  07/03/15 164/92  06/29/15 156/99  12/23/14 159/105   Past Medical History  Diagnosis Date  . GERD (gastroesophageal reflux disease)   . HIV infection ([place])   . Hyperlipidemia   . Hypertension   . Kidney stones   . ANXIETY DISORDER, GENERALIZED 08/22/2006    Qualifier: Diagnosis of  By: [name], [name]    . DEGENERATIVE [place] DISEASE 08/22/2006    Qualifier: Diagnosis of  By: [name], [name]    . GENITAL HERPES 08/22/2006    Qualifier: Diagnosis of  By: [name], [name]    . GERD 07/28/2006    Qualifier: Diagnosis of  By: [name], [name]    . HEPATITIS B, CHRONIC 07/28/2006    Qualifier: Diagnosis of  By: [name], [name]    . HIV DISEASE 07/28/2006    Annotation: dx 2000 Qualifier: Diagnosis of  By: [name], [name]    . HYPERTENSION NEC 07/07/2009    Qualifier: Diagnosis of  By: [name], [name]    . NEPHROLITHIASIS 08/22/2006    Annotation: x2, last 1992 Qualifier: Diagnosis of  By: [name], [name]    . Obesity 09/12/2011  . PSORIASIS 08/22/2006    Qualifier: Diagnosis of  By:  [name], [name]    . CERVICAL RADICULOPATHY 08/22/2006    Annotation: L arm 2002 Qualifier: Diagnosis of  By: [name], [name]    . Carpal tunnel syndrome, right 05/31/2012   Past Surgical History  Procedure Laterality Date  . Cystectomy  2000    cyst removed off buttock cheek    reports that he quit smoking about 2 years ago. His smoking use included Cigarettes. He smoked 0.10 packs per day. He has never used smokeless tobacco. He reports that he drinks about 1.5 oz of alcohol per week. He reports that he does not use illicit drugs. family history includes Arthritis in his other; Cancer in his other; Colon cancer in his sister; Heart disease in his other; Hypertension in his other. Allergies  Allergen Reactions  . Lisinopril Rash   Current Outpatient Prescriptions on File Prior to Visit  Medication Sig Dispense Refill  . amLODipine (NORVASC) 10 MG tablet TAKE 1 TABLET (10 MG TOTAL) BY MOUTH DAILY. 90 tablet 3  . aspirin 81 MG tablet Take 81 mg by mouth daily.    Marland Kitchen elvitegravir-cobicistat-emtricitabine-tenofovir (GENVOYA) 150-150-200-10 MG TABS tablet Take 1 tablet by mouth daily with breakfast. 30 tablet 11  . valACYclovir (VALTREX) 500 MG tablet Take 1 tablet (500 mg total) by mouth 2 (two) times daily. 180 tablet 3  . CONDYLOX 0.5 % gel     .  scopolamine (TRANSDERM-SCOP) 1.5 MG Place 1 patch (1.5 mg total) onto the skin every 3 (three) days. (Patient not taking: Reported on 07/03/2015) 10 patch 12   No current facility-administered medications on file prior to visit.   Review of Systems Constitutional: Negative for increased diaphoresis, other activity, appetite or siginficant weight change other than noted HENT: Negative for worsening hearing loss, ear pain, facial swelling, mouth sores and neck stiffness.   Eyes: Negative for other worsening pain, redness or visual disturbance.  Respiratory: Negative for shortness of breath and wheezing  Cardiovascular: Negative for chest pain  and palpitations.  Gastrointestinal: Negative for diarrhea, blood in stool, abdominal distention or other pain Genitourinary: Negative for hematuria, flank pain or change in urine volume.  Musculoskeletal: Negative for myalgias or other joint complaints.  Skin: Negative for color change and wound or drainage.  Neurological: Negative for syncope and numbness. other than noted Hematological: Negative for adenopathy. or other swelling Psychiatric/Behavioral: Negative for hallucinations, SI, self-injury, decreased concentration or other worsening agitation.      Objective:   Physical Exam BP 164/92 mmHg  Pulse 81  Temp(Src) 98.8 F (37.1 C) (Oral)  Ht 6' (1.829 m)  Wt 221 lb (100.245 kg)  BMI 29.97 kg/m2  SpO2 96% VS noted,  Constitutional: Pt is oriented to person, place, and time. Appears well-developed and well-nourished, in no significant distress Head: Normocephalic and atraumatic.  Right Ear: External ear normal.  Left Ear: External ear normal.  Nose: Nose normal.  Mouth/Throat: Oropharynx is clear and moist.  Eyes: Conjunctivae and EOM are normal. Pupils are equal, round, and reactive to light.  Neck: Normal range of motion. Neck supple. No JVD present. No tracheal deviation present or significant neck LA or mass Cardiovascular: Normal rate, regular rhythm, normal heart sounds and intact distal pulses.   Pulmonary/Chest: Effort normal and breath sounds without rales or wheezing  Abdominal: Soft. Bowel sounds are normal. NT. No HSM  Musculoskeletal: Normal range of motion. Exhibits no edema.  Lymphadenopathy:  Has no cervical adenopathy.  Neurological: Pt is alert and oriented to person, place, and time. Pt has normal reflexes. No cranial nerve deficit. Motor grossly intact Skin: Skin is warm and dry. No rash noted.  Psychiatric:  Has normal mood and affect. Behavior is normal.     Assessment & Plan:

## 2015-07-03 NOTE — Patient Instructions (Signed)
Please take all new medication as prescribed - the benicar 20 mg per day  Please continue to monitor your blood pressure at home, and call if still > 140/90 in 3 weeks  Please call if you have dizziness and weakness with a lower blood pressure as well, such as less than 100 on the top number (systolic blood pressure)  Please continue all other medications as before, and refills have been done if requested.  Please have the pharmacy call with any other refills you may need.  Please continue your efforts at being more active, low cholesterol diet, and weight control.  You are otherwise up to date with prevention measures today.  Please keep your appointments with your specialists as you may have planned  Please go to the LAB in the Basement (turn left off the elevator) for the tests to be done today  You will be contacted by phone if any changes need to be made immediately.  Otherwise, you will receive a letter about your results with an explanation, but please check with MyChart first.  Please remember to sign up for MyChart if you have not done so, as this will be important to you in the future with finding out test results, communicating by private email, and scheduling acute appointments online when needed.  Please return in 6 months, or sooner if needed

## 2015-07-04 NOTE — Assessment & Plan Note (Signed)

## 2015-07-04 NOTE — Assessment & Plan Note (Signed)
Uncontrolled, mod to severe, for benicar 20 mg daily, cont all other meds, for improved exercise, wt loss, low salt diet BP Readings from Last 3 Encounters:  07/03/15 164/92  06/29/15 156/99  12/23/14 159/105

## 2015-08-11 ENCOUNTER — Telehealth: Payer: Self-pay | Admitting: *Deleted

## 2015-08-11 MED ORDER — PRAVASTATIN SODIUM 40 MG PO TABS: 40.0000 mg | ORAL_TABLET | Freq: Every day | ORAL | Status: DC

## 2015-08-11 NOTE — Telephone Encounter (Signed)
Received call pt states [facility] never received Prav[facility]atin script. Inform per chart was sent to them on 07/03/15, but will resend to [facility].../lmb

## 2015-09-03 ENCOUNTER — Other Ambulatory Visit: Payer: Self-pay | Admitting: Internal Medicine

## 2015-09-15 ENCOUNTER — Encounter: Payer: Self-pay | Admitting: Internal Medicine

## 2015-09-28 ENCOUNTER — Ambulatory Visit: Payer: Self-pay | Admitting: Internal Medicine

## 2015-10-02 ENCOUNTER — Encounter: Payer: Self-pay | Admitting: Internal Medicine

## 2015-10-02 ENCOUNTER — Ambulatory Visit (INDEPENDENT_AMBULATORY_CARE_PROVIDER_SITE_OTHER): Payer: BLUE CROSS/BLUE SHIELD | Admitting: Internal Medicine

## 2015-10-02 VITALS — BP 132/84 | HR 92 | Temp 98.4°F | Resp 20 | Wt 229.0 lb

## 2015-10-02 DIAGNOSIS — R600 Localized edema: Secondary | ICD-10-CM | POA: Insufficient documentation

## 2015-10-02 DIAGNOSIS — R609 Edema, unspecified: Secondary | ICD-10-CM

## 2015-10-02 DIAGNOSIS — E785 Hyperlipidemia, unspecified: Secondary | ICD-10-CM | POA: Diagnosis not present

## 2015-10-02 DIAGNOSIS — I10 Essential (primary) hypertension: Secondary | ICD-10-CM

## 2015-10-02 MED ORDER — AMLODIPINE-OLMESARTAN 5-40 MG PO TABS
1.0000 | ORAL_TABLET | Freq: Every day | ORAL | Status: DC
Start: 2015-10-02 — End: 2016-09-21

## 2015-10-02 NOTE — Progress Notes (Signed)
Pre visit review using our clinic review tool, if applicable. No additional management support is needed unless otherwise documented below in the visit note. 

## 2015-10-02 NOTE — Progress Notes (Signed)
Subjective:    Patient ID: Anthony Steele, male    DOB: Oct 31, 1960, 55 y.o.   MRN: [id number]  HPI  Here to f/u; overall doing ok,  Pt denies chest pain, increasing sob or doe, wheezing, orthopnea, PND, palpitations, dizziness or syncope, but has had 3 wks worsening bilat LE edema sudden onset.  No increased po fluids or recent med changes, has been on high dose amlodipine > 1 yr. Not taking diuretic, overall good med compliance.  Only recent med change has been change to Genvoya, side effect profile lists LE edema.  No hx of CHF or renal failure, no recent bleeding or known anemia, or low thyroid.   .  Pt denies new neurological symptoms such as new headache, or facial or extremity weakness or numbness.  Pt denies polydipsia, polyuria, or low sugar episode.   Pt denies new neurological symptoms such as new headache, or facial or extremity weakness or numbness.  mostly trying to follow appropriate diet, with wt increased over recent by 8 lbs.  Swelling not really better at night. Wt Readings from Last 3 Encounters:  10/02/15 229 lb (103.874 kg)  07/03/15 221 lb (100.245 kg)  06/29/15 221 lb 12 oz (100.585 kg)   Past Medical History  Diagnosis Date  . GERD (gastroesophageal reflux disease)   . HIV infection ([place])   . Hyperlipidemia   . Hypertension   . Kidney stones   . ANXIETY DISORDER, GENERALIZED 08/22/2006    Qualifier: Diagnosis of  By: [name], Morgane Joerger    . DEGENERATIVE [place] DISEASE 08/22/2006    Qualifier: Diagnosis of  By: [name], Delta Deshmukh    . GENITAL HERPES 08/22/2006    Qualifier: Diagnosis of  By: [name], Raimi Guillermo    . GERD 07/28/2006    Qualifier: Diagnosis of  By: [name], Azaliyah Kennard    . HEPATITIS B, CHRONIC 07/28/2006    Qualifier: Diagnosis of  By: [name], Harlan Ervine    . HIV DISEASE 07/28/2006    Annotation: dx 2000 Qualifier: Diagnosis of  By: [name], Natsumi Whitsitt    . HYPERTENSION NEC 07/07/2009    Qualifier: Diagnosis of  By: [name], Vilas Edgerly    . NEPHROLITHIASIS 08/22/2006      Annotation: x2, last 1992 Qualifier: Diagnosis of  By: [name], Takirah Binford    . Obesity 09/12/2011  . PSORIASIS 08/22/2006    Qualifier: Diagnosis of  By: [name], Aloise Copus    . CERVICAL RADICULOPATHY 08/22/2006    Annotation: L arm 2002 Qualifier: Diagnosis of  By: [name], Symphany Fleissner    . Carpal tunnel syndrome, right 05/31/2012   Past Surgical History  Procedure Laterality Date  . Cystectomy  2000    cyst removed off buttock cheek    reports that he quit smoking about 3 years ago. His smoking use included Cigarettes. He smoked 0.10 packs per day. He has never used smokeless tobacco. He reports that he drinks about 1.5 oz of alcohol per week. He reports that he does not use illicit drugs. family history includes Arthritis in his other; Cancer in his other; Colon cancer in his sister; Heart disease in his other; Hypertension in his other. Allergies  Allergen Reactions  . Lisinopril Rash   Current Outpatient Prescriptions on File Prior to Visit  Medication Sig Dispense Refill  . aspirin 81 MG tablet Take 81 mg by mouth daily.    . CONDYLOX 0.5 % gel     . diclofenac (VOLTAREN) 50 MG EC tablet TAKE  1 TABLET BY MOUTH TWICE A DAY AS NEEDED FOR INFLAMMATION  1  . elvitegravir-cobicistat-emtricitabine-tenofovir (GENVOYA) 150-150-200-10 MG TABS tablet Take 1 tablet by mouth daily with breakfast. 30 tablet 11  . gabapentin (NEURONTIN) 100 MG capsule   0  . pantoprazole (PROTONIX) 40 MG tablet TAKE 1 TABLET (40 MG TOTAL) BY MOUTH DAILY. 90 tablet 1  . pravastatin (PRAVACHOL) 40 MG tablet Take 1 tablet (40 mg total) by mouth daily. 90 tablet 3  . scopolamine (TRANSDERM-SCOP) 1.5 MG Place 1 patch (1.5 mg total) onto the skin every 3 (three) days. 10 patch 12  . valACYclovir (VALTREX) 500 MG tablet Take 1 tablet (500 mg total) by mouth 2 (two) times daily. 180 tablet 3   No current facility-administered medications on file prior to visit.   Review of Systems  Constitutional: Negative for unusual  diaphoresis or night sweats HENT: Negative for ringing in ear or discharge Eyes: Negative for double vision or worsening visual disturbance.  Respiratory: Negative for choking and stridor.   Gastrointestinal: Negative for vomiting or other signifcant bowel change Genitourinary: Negative for hematuria or change in urine volume.  Musculoskeletal: Negative for other MSK pain or swelling Skin: Negative for color change and worsening wound.  Neurological: Negative for tremors and numbness other than noted  Psychiatric/Behavioral: Negative for decreased concentration or agitation other than above       Objective:   Physical Exam BP 132/84 mmHg  Pulse 92  Temp(Src) 98.4 F (36.9 C) (Oral)  Resp 20  Wt 229 lb (103.874 kg)  SpO2 96% VS noted, not ill appearing Constitutional: Pt appears in no significant distress HENT: Head: NCAT.  Right Ear: External ear normal.  Left Ear: External ear normal.  Eyes: . Pupils are equal, round, and reactive to light. Conjunctivae and EOM are normal Neck: Normal range of motion. Neck supple.  Cardiovascular: Normal rate and regular rhythm.   Pulmonary/Chest: Effort normal and breath sounds without rales or wheezing.  Abd:  Soft, NT, ND, + BS Neurological: Pt is alert. Not confused , motor grossly intact Skin: Skin is warm. No rash, trace to 1+ bilat LE edema to knees, has numerous bilat LE varicosities Psychiatric: Pt behavior is normal. No agitation.     Assessment & Plan:

## 2015-10-02 NOTE — Patient Instructions (Signed)
OK to stop the amlodipine 10 mg  OK to stop the benicar 20 mg  Please take all new medication as prescribed - the amlodipine-benicar 5/40 mg per day  Please call if BP becomes < 110 and you have weakness or dizziness  Please avoid excess fluids, try to elevated legs when sitting if possible, and wt loss is better for swelling as well  Please continue all other medications as before, and refills have been done if requested.  Please have the pharmacy call with any other refills you may need.  Please keep your appointments with your specialists as you may have planned  You will be contacted regarding the referral for: Echocardiogram

## 2015-10-03 NOTE — Assessment & Plan Note (Signed)
stable overall by history and exam, recent data reviewed with pt, and pt to continue medical treatment as before,  to f/u any worsening symptoms or concerns Lab Results  Component Value Date   LDLCALC 134* 07/03/2015   decliens statin for now, for lower chol diet

## 2015-10-03 NOTE — Assessment & Plan Note (Signed)
Etiology unclear, c/w venous insuff, med side effect (genvoya), vs new onset CHF - for echo, leg elevation, otherwise salt, wt loss, consider compression stockings.

## 2015-10-03 NOTE — Assessment & Plan Note (Signed)
Edema possibly high norvasc related though seems less likely, ok to decrease to 5 mg daily, increase benicar to 40 mg in combination med - azor 5/40 generic

## 2015-10-28 DIAGNOSIS — A630 Anogenital (venereal) warts: Secondary | ICD-10-CM | POA: Diagnosis not present

## 2015-10-29 ENCOUNTER — Ambulatory Visit (HOSPITAL_COMMUNITY): Payer: BLUE CROSS/BLUE SHIELD | Attending: Cardiology

## 2015-10-29 ENCOUNTER — Other Ambulatory Visit: Payer: Self-pay

## 2015-10-29 DIAGNOSIS — I110 Hypertensive heart disease with heart failure: Secondary | ICD-10-CM | POA: Diagnosis not present

## 2015-10-29 DIAGNOSIS — E785 Hyperlipidemia, unspecified: Secondary | ICD-10-CM

## 2015-10-29 DIAGNOSIS — I5031 Acute diastolic (congestive) heart failure: Secondary | ICD-10-CM | POA: Insufficient documentation

## 2015-10-29 DIAGNOSIS — I10 Essential (primary) hypertension: Secondary | ICD-10-CM | POA: Diagnosis not present

## 2015-10-29 DIAGNOSIS — R609 Edema, unspecified: Secondary | ICD-10-CM | POA: Diagnosis not present

## 2015-11-25 DIAGNOSIS — D2372 Other benign neoplasm of skin of left lower limb, including hip: Secondary | ICD-10-CM | POA: Diagnosis not present

## 2015-12-01 DIAGNOSIS — A630 Anogenital (venereal) warts: Secondary | ICD-10-CM | POA: Diagnosis not present

## 2015-12-29 ENCOUNTER — Other Ambulatory Visit: Payer: BLUE CROSS/BLUE SHIELD

## 2015-12-29 DIAGNOSIS — B20 Human immunodeficiency virus [HIV] disease: Secondary | ICD-10-CM | POA: Diagnosis not present

## 2015-12-29 LAB — CBC
HCT: 39.4 % (ref 38.5–50.0)
Hemoglobin: 13 g/dL — ABNORMAL LOW (ref 13.2–17.1)
MCH: 32.8 pg (ref 27.0–33.0)
MCHC: 33 g/dL (ref 32.0–36.0)
MCV: 99.5 fL (ref 80.0–100.0)
MPV: 10.2 fL (ref 7.5–12.5)
PLATELETS: 161 10*3/uL (ref 140–400)
RBC: 3.96 MIL/uL — ABNORMAL LOW (ref 4.20–5.80)
RDW: 13 % (ref 11.0–15.0)
WBC: 4.1 10*3/uL (ref 3.8–10.8)

## 2015-12-29 LAB — LIPID PANEL
Cholesterol: 173 mg/dL (ref 125–200)
HDL: 58 mg/dL (ref >=40)
LDL CALC: 91 mg/dL (ref <130)
TRIGLYCERIDES: 121 mg/dL (ref <150)
Total CHOL/HDL Ratio: 3 Ratio (ref <=5.0)
VLDL: 24 mg/dL (ref <30)

## 2015-12-29 LAB — COMPREHENSIVE METABOLIC PANEL
ALT: 32 U/L (ref 9–46)
AST: 34 U/L (ref 10–35)
Albumin: 4.5 g/dL (ref 3.6–5.1)
Alkaline Phosphatase: 47 U/L (ref 40–115)
BILIRUBIN TOTAL: 0.5 mg/dL (ref 0.2–1.2)
BUN: 16 mg/dL (ref 7–25)
CO2: 21 mmol/L (ref 20–31)
Calcium: 9.2 mg/dL (ref 8.6–10.3)
Chloride: 103 mmol/L (ref 98–110)
Creat: 0.96 mg/dL (ref 0.70–1.33)
GLUCOSE: 131 mg/dL — AB (ref 65–99)
POTASSIUM: 5 mmol/L (ref 3.5–5.3)
Sodium: 136 mmol/L (ref 135–146)
Total Protein: 6.7 g/dL (ref 6.1–8.1)

## 2015-12-30 LAB — RPR

## 2015-12-30 LAB — T-HELPER CELL (CD4) - (RCID CLINIC ONLY)
CD4 % Helper T Cell: 30 % — ABNORMAL LOW (ref 33–55)
CD4 T CELL ABS: 380 /uL — AB (ref 400–2700)

## 2015-12-30 LAB — HIV-1 RNA QUANT-NO REFLEX-BLD
HIV 1 RNA Quant: <20 copies/mL (ref <20)
HIV-1 RNA Quant, Log: <1.3 Log copies/mL (ref <1.30)

## 2016-01-07 ENCOUNTER — Encounter: Payer: Self-pay | Admitting: Internal Medicine

## 2016-01-07 NOTE — Telephone Encounter (Signed)
Corinne or staff to contact pt  Ok for acute OV with me today or other provider if has opening

## 2016-01-08 ENCOUNTER — Encounter: Payer: Self-pay | Admitting: Internal Medicine

## 2016-01-08 ENCOUNTER — Ambulatory Visit (INDEPENDENT_AMBULATORY_CARE_PROVIDER_SITE_OTHER): Payer: BLUE CROSS/BLUE SHIELD | Admitting: Internal Medicine

## 2016-01-08 VITALS — BP 132/72 | HR 77 | Temp 98.9°F | Resp 20 | Wt 221.0 lb

## 2016-01-08 DIAGNOSIS — R05 Cough: Secondary | ICD-10-CM | POA: Diagnosis not present

## 2016-01-08 DIAGNOSIS — I10 Essential (primary) hypertension: Secondary | ICD-10-CM

## 2016-01-08 DIAGNOSIS — B20 Human immunodeficiency virus [HIV] disease: Secondary | ICD-10-CM

## 2016-01-08 DIAGNOSIS — R059 Cough, unspecified: Secondary | ICD-10-CM

## 2016-01-08 DIAGNOSIS — R062 Wheezing: Secondary | ICD-10-CM

## 2016-01-08 MED ORDER — PREDNISONE 10 MG PO TABS: ORAL_TABLET | ORAL | Status: DC

## 2016-01-08 MED ORDER — LEVOFLOXACIN 500 MG PO TABS: 500.0000 mg | ORAL_TABLET | Freq: Every day | ORAL | Status: DC

## 2016-01-08 MED ORDER — HYDROCODONE-HOMATROPINE 5-1.5 MG/5ML PO SYRP: 5.0000 mL | ORAL_SOLUTION | Freq: Four times a day (QID) | ORAL | Status: DC | PRN

## 2016-01-08 MED ORDER — METHYLPREDNISOLONE ACETATE 80 MG/ML IJ SUSP
80.0000 mg | Freq: Once | INTRAMUSCULAR | Status: AC
  Administered 2016-01-08: 80 mg via INTRAMUSCULAR

## 2016-01-08 NOTE — Progress Notes (Signed)
Subjective:    Patient ID: Anthony Steele, male    DOB: [birth year], 55 y.o.   MRN: [id number]  HPI  Here with acute onset mild to mod 2-3 days ST, HA, general weakness and malaise, with prod cough greenish sputum, but Pt denies chest pain, increased sob or doe, wheezing, orthopnea, PND, increased LE swelling, palpitations, dizziness or syncope, except for onset wheezing, mild sob last1-2 days Past Medical History  Diagnosis Date  . GERD (gastroesophageal reflux disease)   . HIV infection ([place])   . Hyperlipidemia   . Hypertension   . Kidney stones   . ANXIETY DISORDER, GENERALIZED 08/22/2006    Qualifier: Diagnosis of  By: [name], Katlin Ciszewski    . DEGENERATIVE [place] DISEASE 08/22/2006    Qualifier: Diagnosis of  By: [name], Nylah Butkus    . GENITAL HERPES 08/22/2006    Qualifier: Diagnosis of  By: [name], Keenan Dimitrov    . GERD 07/28/2006    Qualifier: Diagnosis of  By: [name], Allister Lessley    . HEPATITIS B, CHRONIC 07/28/2006    Qualifier: Diagnosis of  By: [name], Gabrielly Mccrystal    . HIV DISEASE 07/28/2006    Annotation: dx 2000 Qualifier: Diagnosis of  By: [name], Tashan Kreitzer    . HYPERTENSION NEC 07/07/2009    Qualifier: Diagnosis of  By: [name], Raya Mckinstry    . NEPHROLITHIASIS 08/22/2006    Annotation: x2, last 1992 Qualifier: Diagnosis of  By: [name], Analiya Porco    . Obesity 09/12/2011  . PSORIASIS 08/22/2006    Qualifier: Diagnosis of  By: [name], Starletta Houchin    . CERVICAL RADICULOPATHY 08/22/2006    Annotation: L arm 2002 Qualifier: Diagnosis of  By: [name], Karolyna Bianchini    . Carpal tunnel syndrome, right 05/31/2012   Past Surgical History  Procedure Laterality Date  . Cystectomy  2000    cyst removed off buttock cheek    reports that he quit smoking about 3 years ago. His smoking use included Cigarettes. He smoked 0.10 packs per day. He has never used smokeless tobacco. He reports that he drinks about 1.5 oz of alcohol per week. He reports that he does not use illicit drugs. family history includes  Arthritis in his other; Cancer in his other; Colon cancer in his sister; Heart disease in his other; Hypertension in his other. Allergies  Allergen Reactions  . Lisinopril Rash   Current Outpatient Prescriptions on File Prior to Visit  Medication Sig Dispense Refill  . amLODipine-olmesartan (AZOR) 5-40 MG tablet Take 1 tablet by mouth daily. 90 tablet 3  . aspirin 81 MG tablet Take 81 mg by mouth daily.    . CONDYLOX 0.5 % gel     . diclofenac (VOLTAREN) 50 MG EC tablet TAKE 1 TABLET BY MOUTH TWICE A DAY AS NEEDED FOR INFLAMMATION  1  . elvitegravir-cobicistat-emtricitabine-tenofovir (GENVOYA) 150-150-200-10 MG TABS tablet Take 1 tablet by mouth daily with breakfast. 30 tablet 11  . gabapentin (NEURONTIN) 100 MG capsule   0  . pantoprazole (PROTONIX) 40 MG tablet TAKE 1 TABLET (40 MG TOTAL) BY MOUTH DAILY. 90 tablet 1  . pravastatin (PRAVACHOL) 40 MG tablet Take 1 tablet (40 mg total) by mouth daily. 90 tablet 3  . scopolamine (TRANSDERM-SCOP) 1.5 MG Place 1 patch (1.5 mg total) onto the skin every 3 (three) days. 10 patch 12  . valACYclovir (VALTREX) 500 MG tablet Take 1 tablet (500 mg total) by mouth 2 (two) times daily. 180 tablet 3  No current facility-administered medications on file prior to visit.   Review of Systems  Constitutional: Negative for unusual diaphoresis or night sweats HENT: Negative for ear swelling or discharge Eyes: Negative for worsening visual haziness  Respiratory: Negative for choking and stridor.   Gastrointestinal: Negative for distension or worsening eructation Genitourinary: Negative for retention or change in urine volume.  Musculoskeletal: Negative for other MSK pain or swelling Skin: Negative for color change and worsening wound Neurological: Negative for tremors and numbness other than noted  Psychiatric/Behavioral: Negative for decreased concentration or agitation other than above       Objective:   Physical Exam BP 132/72 mmHg  Pulse 77   Temp(Src) 98.9 F (37.2 C) (Oral)  Resp 20  Wt 221 lb (100.245 kg)  SpO2 97% VS noted, mild ill Constitutional: Pt appears in no apparent distress HENT: Head: NCAT.  Right Ear: External ear normal.  Left Ear: External ear normal.  Bilat tm's with mild erythema.  Max sinus areas non tender.  Pharynx with mild erythema, no exudate Eyes: . Pupils are equal, round, and reactive to light. Conjunctivae and EOM are normal Neck: Normal range of motion. Neck supple.  Cardiovascular: Normal rate and regular rhythm.   Pulmonary/Chest: Effort normal and breath sounds without rales or wheezing. except for bilat rhonchi and occas insp wheezes noted Neurological: Pt is alert. Not confused , motor grossly intact Skin: Skin is warm. No rash, no LE edema Psychiatric: Pt behavior is normal. No agitation.     Assessment & Plan:

## 2016-01-08 NOTE — Progress Notes (Signed)
Pre visit review using our clinic review tool, if applicable. No additional management support is needed unless otherwise documented below in the visit note. 

## 2016-01-08 NOTE — Patient Instructions (Signed)
You had the steroid shot today  Please take all new medication as prescribed - the antibiotic, cough medicine, and low dose prednisone  Please continue all other medications as before, and refills have been done if requested.  Please have the pharmacy call with any other refills you may need.  Please keep your appointments with your specialists as you may have planned

## 2016-01-08 NOTE — Assessment & Plan Note (Signed)
stable overall by history and exam, recent data reviewed with pt, and pt to continue medical treatment as before,  to f/u any worsening symptoms or concerns BP Readings from Last 3 Encounters:  01/08/16 132/72  10/02/15 132/84  07/03/15 164/92

## 2016-01-08 NOTE — Assessment & Plan Note (Signed)
Mild to mod, c/w bronchospastic type most likely, related to above, for depomedrol IM, predpac asd,  to f/u any worsening symptoms or concerns

## 2016-01-08 NOTE — Assessment & Plan Note (Signed)
stable overall by history and exam, recent data reviewed with pt - CD4 counts, and pt to continue medical treatment as before,  to f/u any worsening symptoms or concerns

## 2016-01-08 NOTE — Assessment & Plan Note (Signed)
Mild to mod,c/w bronchitis vs pna, for antibx course, cough med prn, declines cxr, to f/u any worsening symptoms or concerns 

## 2016-01-12 ENCOUNTER — Ambulatory Visit: Payer: BLUE CROSS/BLUE SHIELD | Admitting: Internal Medicine

## 2016-01-19 ENCOUNTER — Encounter: Payer: Self-pay | Admitting: Internal Medicine

## 2016-02-09 ENCOUNTER — Encounter: Payer: Self-pay | Admitting: Internal Medicine

## 2016-02-09 ENCOUNTER — Ambulatory Visit (INDEPENDENT_AMBULATORY_CARE_PROVIDER_SITE_OTHER): Payer: BLUE CROSS/BLUE SHIELD | Admitting: Internal Medicine

## 2016-02-09 VITALS — BP 111/74 | HR 67 | Temp 98.3°F | Wt 216.8 lb

## 2016-02-09 DIAGNOSIS — B181 Chronic viral hepatitis B without delta-agent: Secondary | ICD-10-CM | POA: Diagnosis not present

## 2016-02-09 DIAGNOSIS — B20 Human immunodeficiency virus [HIV] disease: Secondary | ICD-10-CM

## 2016-02-09 NOTE — Assessment & Plan Note (Signed)
I will check a repeat hepatitis B DNA viral load today.

## 2016-02-09 NOTE — Assessment & Plan Note (Signed)
His HIV infection remains under excellent control. He will continue Genvoya and follow-up after lab work in one year.

## 2016-02-09 NOTE — Progress Notes (Signed)
Patient Active Problem List   Diagnosis Date Noted  . Human immunodeficiency virus (HIV) disease ([place]) 07/28/2006    Priority: High  . Chronic hepatitis B virus infection ([place]) 07/28/2006    Priority: High  . Obesity 09/12/2011    Priority: Medium  . Essential hypertension 07/07/2009    Priority: Medium  . CIGARETTE SMOKER 05/08/2007    Priority: Medium  . GERD 07/28/2006    Priority: Medium  . Cough 01/08/2016  . Wheezing 01/08/2016  . Peripheral edema 10/02/2015  . Pain of right heel 06/18/2013  . Preventative health care 05/31/2012  . Hyperlipidemia 05/31/2012  . Carpal tunnel syndrome, right 05/31/2012  . Dizziness 04/19/2011  . LOW BACK PAIN, ACUTE 05/27/2010  . GENITAL HERPES 08/22/2006  . VENEREAL WART 08/22/2006  . ANXIETY DISORDER, GENERALIZED 08/22/2006  . NEPHROLITHIASIS 08/22/2006  . PSORIASIS 08/22/2006  . DEGENERATIVE DISC DISEASE 08/22/2006  . CERVICAL RADICULOPATHY 08/22/2006  . PNEUMONIA 01/22/2006    Patient's Medications  New Prescriptions   No medications on file  Previous Medications   AMLODIPINE-OLMESARTAN (AZOR) 5-40 MG TABLET    Take 1 tablet by mouth daily.   ASPIRIN 81 MG TABLET    Take 81 mg by mouth daily.   ELVITEGRAVIR-COBICISTAT-EMTRICITABINE-TENOFOVIR (GENVOYA) 150-150-200-10 MG TABS TABLET    Take 1 tablet by mouth daily with breakfast.   GABAPENTIN (NEURONTIN) 100 MG CAPSULE       PANTOPRAZOLE (PROTONIX) 40 MG TABLET    TAKE 1 TABLET (40 MG TOTAL) BY MOUTH DAILY.   PRAVASTATIN (PRAVACHOL) 40 MG TABLET    Take 1 tablet (40 mg total) by mouth daily.   VALACYCLOVIR (VALTREX) 500 MG TABLET    Take 1 tablet (500 mg total) by mouth 2 (two) times daily.  Modified Medications   No medications on file  Discontinued Medications   CONDYLOX 0.5 % GEL       DICLOFENAC (VOLTAREN) 50 MG EC TABLET    TAKE 1 TABLET BY MOUTH TWICE A DAY AS NEEDED FOR INFLAMMATION   HYDROCODONE-HOMATROPINE (HYCODAN) 5-1.5 MG/5ML SYRUP    Take 5 mLs by  mouth every 6 (six) hours as needed for cough.   LEVOFLOXACIN (LEVAQUIN) 500 MG TABLET    Take 1 tablet (500 mg total) by mouth daily.   PREDNISONE (DELTASONE) 10 MG TABLET    3 tabs by mouth per day for 3 days,2tabs per day for 3 days,1tab per day for 3 days   SCOPOLAMINE (TRANSDERM-SCOP) 1.5 MG    Place 1 patch (1.5 mg total) onto the skin every 3 (three) days.    Subjective: Anthony Steele is in for his routine HIV follow-up visit. He has had no problems obtaining or tolerating his Genvoya and does not believe he has missed any doses.   Review of Systems: Review of Systems  Constitutional: Negative for fever, chills, weight loss, malaise/fatigue and diaphoresis.  HENT: Negative for sore throat.   Respiratory: Negative for cough, sputum production and shortness of breath.   Cardiovascular: Negative for chest pain.  Gastrointestinal: Negative for nausea, vomiting and diarrhea.  Genitourinary: Negative for dysuria and frequency.  Musculoskeletal: Negative for myalgias and joint pain.  Skin: Negative for rash.  Neurological: Negative for dizziness and headaches.  Psychiatric/Behavioral: Negative for depression and substance abuse. The patient is not nervous/anxious.     Past Medical History  Diagnosis Date  . GERD (gastroesophageal reflux disease)   . HIV infection ([place])   . Hyperlipidemia   .  Hypertension   . Kidney stones   . ANXIETY DISORDER, GENERALIZED 08/22/2006    Qualifier: Diagnosis of  By: [name], Mika Griffitts    . DEGENERATIVE [place] DISEASE 08/22/2006    Qualifier: Diagnosis of  By: [name], Wilford Merryfield    . GENITAL HERPES 08/22/2006    Qualifier: Diagnosis of  By: [name], Lucielle Vokes    . GERD 07/28/2006    Qualifier: Diagnosis of  By: [name], Alaney Witter    . HEPATITIS B, CHRONIC 07/28/2006    Qualifier: Diagnosis of  By: [name], Ermalinda Joubert    . HIV DISEASE 07/28/2006    Annotation: dx 2000 Qualifier: Diagnosis of  By: [name], Ayinde Swim    . HYPERTENSION NEC 07/07/2009    Qualifier: Diagnosis  of  By: [name], Reagan Klemz    . NEPHROLITHIASIS 08/22/2006    Annotation: x2, last 1992 Qualifier: Diagnosis of  By: [name], Sahara Fujimoto    . Obesity 09/12/2011  . PSORIASIS 08/22/2006    Qualifier: Diagnosis of  By: [name], Jaedyn Marrufo    . CERVICAL RADICULOPATHY 08/22/2006    Annotation: L arm 2002 Qualifier: Diagnosis of  By: [name], Karron Goens    . Carpal tunnel syndrome, right 05/31/2012    Social History  Substance Use Topics  . Smoking status: Former Smoker -- 0.10 packs/day    Types: Cigarettes    Quit date: 07/18/2012  . Smokeless tobacco: Never Used     Comment: quit over cruise over the holidays  . Alcohol Use: 1.5 oz/week    3 drink(s) per week    Family History  Problem Relation Age of Onset  . Heart disease Other   . Hypertension Other   . Arthritis Other   . Cancer Other     colon cancer  . Colon cancer Sister     Allergies  Allergen Reactions  . Lisinopril Rash    Objective:  Filed Vitals:   02/09/16 1534  BP: 111/74  Pulse: 67  Temp: 98.3 F (36.8 C)  TempSrc: Oral  Weight: 216 lb 12 oz (98.317 kg)   Body mass index is 29.39 kg/(m^2).  Physical Exam  Constitutional: He is oriented to person, place, and time.  HENT:  Mouth/Throat: No oropharyngeal exudate.  Eyes: Conjunctivae are normal.  Cardiovascular: Normal rate and regular rhythm.   No murmur heard. Pulmonary/Chest: Breath sounds normal.  Abdominal: Soft. He exhibits no mass. There is no tenderness.  Musculoskeletal: Normal range of motion.  Neurological: He is alert and oriented to person, place, and time.  Skin: No rash noted.  Psychiatric: Mood and affect normal.    Lab Results Lab Results  Component Value Date   WBC 4.1 12/29/2015   HGB 13.0* 12/29/2015   HCT 39.4 12/29/2015   MCV 99.5 12/29/2015   PLT 161 12/29/2015    Lab Results  Component Value Date   CREATININE 0.96 12/29/2015   BUN 16 12/29/2015   NA 136 12/29/2015   K 5.0 12/29/2015   CL 103 12/29/2015   CO2 21  12/29/2015    Lab Results  Component Value Date   ALT 32 12/29/2015   AST 34 12/29/2015   ALKPHOS 47 12/29/2015   BILITOT 0.5 12/29/2015    Lab Results  Component Value Date   CHOL 173 12/29/2015   HDL 58 12/29/2015   LDLCALC 91 12/29/2015   TRIG 121 12/29/2015   CHOLHDL 3.0 12/29/2015   HIV 1 RNA QUANT (copies/mL)  Date Value  12/29/2015 <20  06/10/2015 <  20  10/30/2014 <20   CD4 T CELL ABS (/uL)  Date Value  12/29/2015 380*  06/10/2015 470  10/30/2014 440     Problem List Items Addressed This Visit      High   Chronic hepatitis B virus infection ([place])    I will check a repeat hepatitis B DNA viral load today.      Relevant Orders   Hepatitis B DNA, ultraquantitative, PCR   Human immunodeficiency virus (HIV) disease ([place]) - Primary    His HIV infection remains under excellent control. He will continue Genvoya and follow-up after lab work in one year.           Michel Bickers, MD [facility] [phone] pager   [phone] cell 02/09/2016, 5:21 PM

## 2016-02-16 LAB — HEPATITIS B DNA, ULTRAQUANTITATIVE, PCR
Hepatitis B DNA (Calc): <1.3 Log IU/mL (ref <1.30)
Hepatitis B DNA: <20 IU/mL (ref <20)

## 2016-05-18 ENCOUNTER — Other Ambulatory Visit: Payer: Self-pay | Admitting: *Deleted

## 2016-05-18 ENCOUNTER — Encounter: Payer: Self-pay | Admitting: Internal Medicine

## 2016-05-20 ENCOUNTER — Other Ambulatory Visit: Payer: Self-pay | Admitting: *Deleted

## 2016-05-20 DIAGNOSIS — A6000 Herpesviral infection of urogenital system, unspecified: Secondary | ICD-10-CM

## 2016-05-20 MED ORDER — VALACYCLOVIR HCL 500 MG PO TABS: 500.0000 mg | ORAL_TABLET | Freq: Two times a day (BID) | ORAL | 3 refills | Status: DC

## 2016-05-25 ENCOUNTER — Ambulatory Visit (INDEPENDENT_AMBULATORY_CARE_PROVIDER_SITE_OTHER): Payer: BLUE CROSS/BLUE SHIELD

## 2016-05-25 DIAGNOSIS — Z23 Encounter for immunization: Secondary | ICD-10-CM | POA: Diagnosis not present

## 2016-05-28 ENCOUNTER — Other Ambulatory Visit (INDEPENDENT_AMBULATORY_CARE_PROVIDER_SITE_OTHER): Payer: Self-pay | Admitting: Specialist

## 2016-05-30 NOTE — Telephone Encounter (Signed)
Ok to refill 

## 2016-05-30 NOTE — Telephone Encounter (Signed)
Noted, Thanks

## 2016-06-08 ENCOUNTER — Other Ambulatory Visit: Payer: Self-pay | Admitting: *Deleted

## 2016-06-08 ENCOUNTER — Other Ambulatory Visit: Payer: Self-pay | Admitting: Internal Medicine

## 2016-06-08 DIAGNOSIS — B20 Human immunodeficiency virus [HIV] disease: Secondary | ICD-10-CM

## 2016-06-08 DIAGNOSIS — A6000 Herpesviral infection of urogenital system, unspecified: Secondary | ICD-10-CM

## 2016-06-08 MED ORDER — VALACYCLOVIR HCL 500 MG PO TABS: 500.0000 mg | ORAL_TABLET | Freq: Two times a day (BID) | ORAL | 3 refills | Status: DC

## 2016-06-13 ENCOUNTER — Encounter: Payer: Self-pay | Admitting: Internal Medicine

## 2016-06-13 ENCOUNTER — Other Ambulatory Visit: Payer: Self-pay

## 2016-06-13 DIAGNOSIS — A6000 Herpesviral infection of urogenital system, unspecified: Secondary | ICD-10-CM

## 2016-06-13 MED ORDER — VALACYCLOVIR HCL 500 MG PO TABS: 500.0000 mg | ORAL_TABLET | Freq: Two times a day (BID) | ORAL | 3 refills | Status: DC

## 2016-06-13 NOTE — Telephone Encounter (Signed)
Patient has made several request for Valtrex refill through mail order pharmacy.  Even though it has been sent several times the pharmacy is saying they have not received it.   He would like script sent to local [facility]. Medication sent .  Laverle Patter, RN

## 2016-06-14 ENCOUNTER — Encounter: Payer: Self-pay | Admitting: Internal Medicine

## 2016-06-14 ENCOUNTER — Other Ambulatory Visit: Payer: Self-pay | Admitting: *Deleted

## 2016-06-14 DIAGNOSIS — A6000 Herpesviral infection of urogenital system, unspecified: Secondary | ICD-10-CM

## 2016-06-14 MED ORDER — VALACYCLOVIR HCL 500 MG PO TABS: 500.0000 mg | ORAL_TABLET | Freq: Two times a day (BID) | ORAL | 3 refills | Status: DC

## 2016-06-14 NOTE — Telephone Encounter (Signed)
Working with patient to find out which pharmacy has this script locked in.  When a pharmacy accepts a script and locks it in for refill.  No other pharmacy can fill until it is released.   Laverle Patter, RN

## 2016-07-23 ENCOUNTER — Ambulatory Visit (INDEPENDENT_AMBULATORY_CARE_PROVIDER_SITE_OTHER): Payer: BLUE CROSS/BLUE SHIELD | Admitting: Family Medicine

## 2016-07-23 ENCOUNTER — Encounter: Payer: Self-pay | Admitting: Family Medicine

## 2016-07-23 VITALS — BP 108/74 | HR 74 | Temp 99.4°F | Resp 16 | Wt 225.0 lb

## 2016-07-23 DIAGNOSIS — J209 Acute bronchitis, unspecified: Secondary | ICD-10-CM

## 2016-07-23 MED ORDER — AZITHROMYCIN 250 MG PO TABS: ORAL_TABLET | ORAL | 0 refills | Status: DC

## 2016-07-23 NOTE — Progress Notes (Signed)
Pre visit review using our clinic review tool, if applicable. No additional management support is needed unless otherwise documented below in the visit note. 

## 2016-07-23 NOTE — Progress Notes (Signed)
   Subjective:    Patient ID: Anthony Steele, male    DOB: Nov 22, 1960, 55 y.o.   MRN: [id number]  HPI Here for 10 days of PND, chest congestion ,and coughing up yellow sputum. No chest pain or fever. Using Mucinex DM.    Review of Systems  Constitutional: Negative.   HENT: Positive for congestion and postnasal drip. Negative for sinus pain and sore throat.   Eyes: Negative.   Respiratory: Positive for cough and chest tightness. Negative for shortness of breath and wheezing.   Cardiovascular: Negative.        Objective:   Physical Exam  Constitutional: He appears well-developed and well-nourished.  HENT:  Right Ear: External ear normal.  Left Ear: External ear normal.  Nose: Nose normal.  Mouth/Throat: Oropharynx is clear and moist.  Eyes: Conjunctivae are normal.  Neck: No thyromegaly present.  Pulmonary/Chest: Effort normal. No respiratory distress. He has no rales.  Scattered wheezes and rhonchi   Lymphadenopathy:    He has no cervical adenopathy.          Assessment & Plan:  Bronchitis, treat with a Zpack.  Alysia Penna, MD

## 2016-08-05 ENCOUNTER — Other Ambulatory Visit: Payer: Self-pay | Admitting: Internal Medicine

## 2016-09-09 ENCOUNTER — Other Ambulatory Visit: Payer: Self-pay | Admitting: Internal Medicine

## 2016-09-21 ENCOUNTER — Other Ambulatory Visit: Payer: Self-pay | Admitting: Internal Medicine

## 2016-10-22 ENCOUNTER — Encounter: Payer: Self-pay | Admitting: Family Medicine

## 2016-10-22 ENCOUNTER — Ambulatory Visit (INDEPENDENT_AMBULATORY_CARE_PROVIDER_SITE_OTHER): Payer: BLUE CROSS/BLUE SHIELD | Admitting: Family Medicine

## 2016-10-22 ENCOUNTER — Other Ambulatory Visit: Payer: Self-pay | Admitting: Internal Medicine

## 2016-10-22 VITALS — BP 124/84 | HR 74 | Temp 98.1°F | Resp 16 | Ht 72.0 in | Wt 230.0 lb

## 2016-10-22 DIAGNOSIS — R509 Fever, unspecified: Secondary | ICD-10-CM | POA: Diagnosis not present

## 2016-10-22 DIAGNOSIS — R5381 Other malaise: Secondary | ICD-10-CM | POA: Diagnosis not present

## 2016-10-22 LAB — POCT INFLUENZA A/B
INFLUENZA A, POC: NEGATIVE
INFLUENZA B, POC: NEGATIVE

## 2016-10-22 MED ORDER — OSELTAMIVIR PHOSPHATE 75 MG PO CAPS: 75.0000 mg | ORAL_CAPSULE | Freq: Two times a day (BID) | ORAL | 0 refills | Status: DC

## 2016-10-22 MED ORDER — ALBUTEROL SULFATE HFA 108 (90 BASE) MCG/ACT IN AERS: 2.0000 | INHALATION_SPRAY | Freq: Four times a day (QID) | RESPIRATORY_TRACT | 0 refills | Status: DC | PRN

## 2016-10-22 NOTE — Progress Notes (Signed)
Pre visit review using our clinic review tool, if applicable. No additional management support is needed unless otherwise documented below in the visit note. 

## 2016-10-22 NOTE — Patient Instructions (Signed)
It was good to see you today- we hope that you feel much better soon! Your flu test was negative- you may have another type of viral illness, but we will go ahead and treat you for flu also Take the tamiflu twice a day for 5 days, and use the albuterol as needed for cough and wheezing If you continue to run fevers or otherwise feel that you are getting worse please seek care right away!

## 2016-10-22 NOTE — Progress Notes (Signed)
[place] at [facility] [address] [place], [place] [address] [phone]  Date:  10/22/2016   Name:  Anthony Steele   DOB:  September 24, 1960   MRN:  [id number]  PCP:  Cathlean Cower, MD    Chief Complaint: Cough   History of Present Illness:  Anthony Steele is a 56 y.o. very pleasant male patient who presents with the following:  Here today with ilness for the last 4 days.  He first awoke with a scratchy throat. He has also noted that he feels very tired, headaches, cough, fever, body aches, nasal congestion He did notice some diarrhea at start of illness, and he vomited once- these sx are now resolved He has noted a fever at home- up to Tmax 101 a couple of days ago He has tried some OTC medications for his symptoms and lots of cough drops The ST is now better  His HIV is managed by Dr. Megan Salon- he has done well for years and is quite complaint with his meds  Last dose of antipyretics was over 12 hours ago  He notes that his blood counts are always good, his HIV counts were undetectable at last check 9 months ago  He is actually feeing better today, but though he should be tested for the flu just in case.  He did have a flu shot last fall Lab Results  Component Value Date   CD4TCELL 30 (L) 12/29/2015   CD4TABS 380 (L) 12/29/2015      Patient Active Problem List   Diagnosis Date Noted  . Cough 01/08/2016  . Wheezing 01/08/2016  . Peripheral edema 10/02/2015  . Pain of right heel 06/18/2013  . Preventative health care 05/31/2012  . Hyperlipidemia 05/31/2012  . Carpal tunnel syndrome, right 05/31/2012  . Obesity 09/12/2011  . Dizziness 04/19/2011  . LOW BACK PAIN, ACUTE 05/27/2010  . Essential hypertension 07/07/2009  . CIGARETTE SMOKER 05/08/2007  . GENITAL HERPES 08/22/2006  . VENEREAL WART 08/22/2006  . ANXIETY DISORDER, GENERALIZED 08/22/2006  . NEPHROLITHIASIS 08/22/2006  . PSORIASIS 08/22/2006  . DEGENERATIVE  DISC DISEASE 08/22/2006  . CERVICAL RADICULOPATHY 08/22/2006  . Human immunodeficiency virus (HIV) disease ([place]) 07/28/2006  . Chronic hepatitis B virus infection ([place]) 07/28/2006  . GERD 07/28/2006  . PNEUMONIA 01/22/2006    Past Medical History:  Diagnosis Date  . ANXIETY DISORDER, GENERALIZED 08/22/2006   Qualifier: Diagnosis of  By: [name], [name]    . Carpal tunnel syndrome, right 05/31/2012  . CERVICAL RADICULOPATHY 08/22/2006   Annotation: L arm 2002 Qualifier: Diagnosis of  By: [name], [name]    . DEGENERATIVE [place] DISEASE 08/22/2006   Qualifier: Diagnosis of  By: [name], [name]    . GENITAL HERPES 08/22/2006   Qualifier: Diagnosis of  By: [name], [name]    . GERD 07/28/2006   Qualifier: Diagnosis of  By: [name], [name]    . GERD (gastroesophageal reflux disease)   . HEPATITIS B, CHRONIC 07/28/2006   Qualifier: Diagnosis of  By: [name], [name]    . HIV DISEASE 07/28/2006   Annotation: dx 2000 Qualifier: Diagnosis of  By: [name], [name]    . HIV infection ([place])   . Hyperlipidemia   . Hypertension   . HYPERTENSION NEC 07/07/2009   Qualifier: Diagnosis of  By: [name], [name]    . Kidney stones   . NEPHROLITHIASIS 08/22/2006   Annotation: x2, last 1992 Qualifier: Diagnosis of  By: [name], [name]    . Obesity 09/12/2011  . PSORIASIS 08/22/2006   Qualifier: Diagnosis of  By: [name], [name]      Past Surgical History:  Procedure Laterality Date  . CYSTECTOMY  2000   cyst removed off buttock cheek    Social History  Substance Use Topics  . Smoking status: Former Smoker    Packs/day: 0.10    Types: Cigarettes    Quit date: 07/18/2012  . Smokeless tobacco: Never Used     Comment: quit over cruise over the holidays  . Alcohol use 1.5 oz/week    3 drink(s) per week    Family History  Problem Relation Age of Onset  . Heart disease Other   . Hypertension Other   . Arthritis Other   . Cancer Other     colon cancer  . Colon cancer Sister      Allergies  Allergen Reactions  . Lisinopril Rash    Medication list has been reviewed and updated.  Current Outpatient Prescriptions on File Prior to Visit  Medication Sig Dispense Refill  . amLODipine-olmesartan (AZOR) 5-40 MG tablet Take 1 tablet by mouth daily. Yearly physical due in March must see MD for refills 30 tablet 0  . aspirin 81 MG tablet Take 81 mg by mouth daily.    . diclofenac (VOLTAREN) 50 MG EC tablet TAKE 1 TABLET TWICE A DAY AS NEEDED FOR INFLAMMATION 60 tablet 3  . GENVOYA 150-150-200-10 MG TABS tablet TAKE 1 TABLET BY MOUTH EVERY DAY WITH BREAKFAST 90 tablet 3  . pantoprazole (PROTONIX) 40 MG tablet TAKE 1 TABLET (40 MG TOTAL) BY MOUTH DAILY. 90 tablet 1  . pravastatin (PRAVACHOL) 40 MG tablet TAKE 1 TABLET (40 MG TOTAL) BY MOUTH DAILY. 90 tablet 3  . valACYclovir (VALTREX) 500 MG tablet Take 1 tablet (500 mg total) by mouth 2 (two) times daily. 180 tablet 3   No current facility-administered medications on file prior to visit.     Review of Systems:  As per HPI- otherwise negative. appetite is normal  Physical Examination: Vitals:   10/22/16 0948  BP: 124/84  Pulse: 74  Resp: 16  Temp: 98.1 F (36.7 C)   Vitals:   10/22/16 0948  Weight: 230 lb (104.3 kg)  Height: 6' (1.829 m)   Body mass index is 31.19 kg/m. Ideal Body Weight: Weight in (lb) to have BMI = 25: 183.9  GEN: WDWN, NAD, Non-toxic, A & O x 3, looks well, currently afebrile HEENT: Atraumatic, Normocephalic. Neck supple. No masses, No LAD.  Bilateral TM wnl, oropharynx normal.  PEERL,EOMI.   Ears and Nose: No external deformity. CV: RRR, No M/G/R. No JVD. No thrill. No extra heart sounds. PULM: CTA B, no wheezes, crackles, rhonchi. No retractions. No resp. distress. No accessory muscle use. ABD: S, NT, ND, +BS. No rebound. No HSM.  Benign belly EXTR: No c/c/e NEURO Normal gait.  PSYCH: Normally interactive. Conversant. Not depressed or anxious appearing.  Calm demeanor.    Results for orders placed or performed in visit on 10/22/16  POCT Influenza A/B  Result Value Ref Range   Influenza A, POC Negative Negative   Influenza B, POC Negative Negative    Assessment and Plan: Fever, unknown origin - Plan: POCT Influenza A/B, oseltamivir (TAMIFLU) 75 MG capsule, albuterol (PROVENTIL HFA;VENTOLIN HFA) 108 (90 Base) MCG/ACT inhaler  Malaise - Plan: POCT Influenza A/B  Pt who is HIV positive but very well controlled, presents today with concern about  flu He did have a fever earlier in his illness but this is now resolved He is feeling better today, and exam is benign  Will give tamiflu as he is HIV positive.  Suspect he does have a viral illness of some sort given his spectrum of symptoms  Given strict precautions- if fever returns or if he is otherwise not doing well he will seek care right away Refilled his albuterol for him to use as needed   Signed Lamar Blinks, MD

## 2016-10-24 ENCOUNTER — Other Ambulatory Visit: Payer: Self-pay | Admitting: Internal Medicine

## 2016-10-24 ENCOUNTER — Encounter: Payer: Self-pay | Admitting: Internal Medicine

## 2016-10-24 MED ORDER — AMLODIPINE-OLMESARTAN 5-40 MG PO TABS: 1.0000 | ORAL_TABLET | Freq: Every day | ORAL | 0 refills | Status: DC

## 2016-11-09 ENCOUNTER — Encounter: Payer: Self-pay | Admitting: Internal Medicine

## 2016-11-09 ENCOUNTER — Ambulatory Visit (INDEPENDENT_AMBULATORY_CARE_PROVIDER_SITE_OTHER): Payer: BLUE CROSS/BLUE SHIELD | Admitting: Internal Medicine

## 2016-11-09 ENCOUNTER — Other Ambulatory Visit (INDEPENDENT_AMBULATORY_CARE_PROVIDER_SITE_OTHER): Payer: BLUE CROSS/BLUE SHIELD

## 2016-11-09 ENCOUNTER — Encounter: Payer: BLUE CROSS/BLUE SHIELD | Admitting: Internal Medicine

## 2016-11-09 DIAGNOSIS — A6000 Herpesviral infection of urogenital system, unspecified: Secondary | ICD-10-CM

## 2016-11-09 DIAGNOSIS — Z0000 Encounter for general adult medical examination without abnormal findings: Secondary | ICD-10-CM

## 2016-11-09 DIAGNOSIS — R739 Hyperglycemia, unspecified: Secondary | ICD-10-CM

## 2016-11-09 DIAGNOSIS — B20 Human immunodeficiency virus [HIV] disease: Secondary | ICD-10-CM

## 2016-11-09 LAB — HEMOGLOBIN A1C: Hgb A1c MFr Bld: 5.5 % (ref 4.6–6.5)

## 2016-11-09 LAB — URINALYSIS, ROUTINE W REFLEX MICROSCOPIC
Bilirubin Urine: NEGATIVE
Hgb urine dipstick: NEGATIVE
KETONES UR: NEGATIVE
Leukocytes, UA: NEGATIVE
Nitrite: NEGATIVE
PH: 6 (ref 5.0–8.0)
RBC / HPF: NONE SEEN (ref 0–?)
TOTAL PROTEIN, URINE-UPE24: NEGATIVE
URINE GLUCOSE: NEGATIVE
Urobilinogen, UA: 0.2 (ref 0.0–1.0)
WBC, UA: NONE SEEN (ref 0–?)

## 2016-11-09 LAB — BASIC METABOLIC PANEL
BUN: 23 mg/dL (ref 6–23)
CALCIUM: 9.8 mg/dL (ref 8.4–10.5)
CHLORIDE: 104 meq/L (ref 96–112)
CO2: 26 meq/L (ref 19–32)
CREATININE: 1.08 mg/dL (ref 0.40–1.50)
GFR: 75.58 mL/min (ref >60.00)
Glucose, Bld: 92 mg/dL (ref 70–99)
Potassium: 4.3 mEq/L (ref 3.5–5.1)
SODIUM: 138 meq/L (ref 135–145)

## 2016-11-09 LAB — CBC WITH DIFFERENTIAL/PLATELET
BASOS ABS: 0.1 10*3/uL (ref 0.0–0.1)
Basophils Relative: 0.8 % (ref 0.0–3.0)
Eosinophils Absolute: 0.1 10*3/uL (ref 0.0–0.7)
Eosinophils Relative: 1.2 % (ref 0.0–5.0)
HCT: 38.1 % — ABNORMAL LOW (ref 39.0–52.0)
Hemoglobin: 12.9 g/dL — ABNORMAL LOW (ref 13.0–17.0)
LYMPHS ABS: 3 10*3/uL (ref 0.7–4.0)
Lymphocytes Relative: 43 % (ref 12.0–46.0)
MCHC: 33.9 g/dL (ref 30.0–36.0)
MCV: 95.5 fl (ref 78.0–100.0)
MONOS PCT: 10.1 % (ref 3.0–12.0)
Monocytes Absolute: 0.7 10*3/uL (ref 0.1–1.0)
NEUTROS ABS: 3.2 10*3/uL (ref 1.4–7.7)
NEUTROS PCT: 44.9 % (ref 43.0–77.0)
PLATELETS: 182 10*3/uL (ref 150.0–400.0)
RBC: 3.98 Mil/uL — ABNORMAL LOW (ref 4.22–5.81)
RDW: 13.3 % (ref 11.5–15.5)
WBC: 7 10*3/uL (ref 4.0–10.5)

## 2016-11-09 LAB — HEPATIC FUNCTION PANEL
ALBUMIN: 5 g/dL (ref 3.5–5.2)
ALK PHOS: 43 U/L (ref 39–117)
ALT: 42 U/L (ref 0–53)
AST: 70 U/L — ABNORMAL HIGH (ref 0–37)
BILIRUBIN DIRECT: 0.1 mg/dL (ref 0.0–0.3)
TOTAL PROTEIN: 7.4 g/dL (ref 6.0–8.3)
Total Bilirubin: 0.6 mg/dL (ref 0.2–1.2)

## 2016-11-09 LAB — TSH: TSH: 5.28 u[IU]/mL — AB (ref 0.35–4.50)

## 2016-11-09 LAB — LIPID PANEL
Cholesterol: 197 mg/dL (ref 0–200)
HDL: 60.5 mg/dL (ref >39.00)
LDL Cholesterol: 124 mg/dL — ABNORMAL HIGH (ref 0–99)
NonHDL: 136.09
Total CHOL/HDL Ratio: 3
Triglycerides: 58 mg/dL (ref 0.0–149.0)
VLDL: 11.6 mg/dL (ref 0.0–40.0)

## 2016-11-09 LAB — PSA: PSA: 0.2 ng/mL (ref 0.10–4.00)

## 2016-11-09 MED ORDER — PRAVASTATIN SODIUM 40 MG PO TABS: 40.0000 mg | ORAL_TABLET | Freq: Every day | ORAL | 3 refills | Status: DC

## 2016-11-09 MED ORDER — VALACYCLOVIR HCL 500 MG PO TABS: 500.0000 mg | ORAL_TABLET | Freq: Two times a day (BID) | ORAL | 3 refills | Status: DC

## 2016-11-09 MED ORDER — AMLODIPINE-OLMESARTAN 5-40 MG PO TABS: 1.0000 | ORAL_TABLET | Freq: Every day | ORAL | 0 refills | Status: DC

## 2016-11-09 MED ORDER — PANTOPRAZOLE SODIUM 40 MG PO TBEC: DELAYED_RELEASE_TABLET | ORAL | 3 refills | Status: DC

## 2016-11-09 MED ORDER — DICLOFENAC SODIUM 50 MG PO TBEC: DELAYED_RELEASE_TABLET | ORAL | 3 refills | Status: DC

## 2016-11-09 NOTE — Assessment & Plan Note (Signed)

## 2016-11-09 NOTE — Progress Notes (Signed)
Pre visit review using our clinic review tool, if applicable. No additional management support is needed unless otherwise documented below in the visit note. 

## 2016-11-09 NOTE — Progress Notes (Signed)
Subjective:    Patient ID: Anthony Steele, male    DOB: 1961-01-25, 56 y.o.   MRN: [id number]  HPI  Here for wellness and f/u;  Overall doing ok;  Pt denies Chest pain, worsening SOB, DOE, wheezing, orthopnea, PND, worsening LE edema, palpitations, dizziness or syncope.  Pt denies neurological change such as new headache, facial or extremity weakness.  Pt denies polydipsia, polyuria, or low sugar symptoms. Pt states overall good compliance with treatment and medications, good tolerability, and has been trying to follow appropriate diet.  Pt denies worsening depressive symptoms, suicidal ideation or panic. No fever, night sweats, wt loss, loss of appetite, or other constitutional symptoms.  Pt states good ability with ADL's, has low fall risk, home safety reviewed and adequate, no other significant changes in hearing or vision, and well active with exercise with gym 5 days a wk, several with personall trainer.  Now seeing ID yearly as has also been doing very well.  No other hx Wt Readings from Last 3 Encounters:  11/09/16 223 lb (101.2 kg)  10/22/16 230 lb (104.3 kg)  07/23/16 225 lb (102.1 kg)   Past Medical History:  Diagnosis Date  . ANXIETY DISORDER, GENERALIZED 08/22/2006   Qualifier: Diagnosis of  By: [name], [name]    . Carpal tunnel syndrome, right 05/31/2012  . CERVICAL RADICULOPATHY 08/22/2006   Annotation: L arm 2002 Qualifier: Diagnosis of  By: [name], [name]    . DEGENERATIVE [place] DISEASE 08/22/2006   Qualifier: Diagnosis of  By: [name], [name]    . GENITAL HERPES 08/22/2006   Qualifier: Diagnosis of  By: [name], [name]    . GERD 07/28/2006   Qualifier: Diagnosis of  By: [name], [name]    . GERD (gastroesophageal reflux disease)   . HEPATITIS B, CHRONIC 07/28/2006   Qualifier: Diagnosis of  By: [name], [name]    . HIV DISEASE 07/28/2006   Annotation: dx 2000 Qualifier: Diagnosis of  By: [name], [name]    . HIV infection ([place])   . Hyperlipidemia   . Hypertension    . HYPERTENSION NEC 07/07/2009   Qualifier: Diagnosis of  By: [name], [name]    . Kidney stones   . NEPHROLITHIASIS 08/22/2006   Annotation: x2, last 1992 Qualifier: Diagnosis of  By: [name], [name]    . Obesity 09/12/2011  . PSORIASIS 08/22/2006   Qualifier: Diagnosis of  By: [name], [name]     Past Surgical History:  Procedure Laterality Date  . CYSTECTOMY  2000   cyst removed off buttock cheek    reports that he quit smoking about 4 years ago. His smoking use included Cigarettes. He smoked 0.10 packs per day. He has never used smokeless tobacco. He reports that he drinks about 1.5 oz of alcohol per week . He reports that he does not use drugs. family history includes Arthritis in his other; Cancer in his other; Colon cancer in his sister; Heart disease in his other; Hypertension in his other. Allergies  Allergen Reactions  . Lisinopril Rash   Current Outpatient Prescriptions on File Prior to Visit  Medication Sig Dispense Refill  . aspirin 81 MG tablet Take 81 mg by mouth daily.    . GENVOYA 150-150-200-10 MG TABS tablet TAKE 1 TABLET BY MOUTH EVERY DAY WITH BREAKFAST 90 tablet 3   No current facility-administered medications on file prior to visit.     Review of Systems Constitutional: Negative for other unusual diaphoresis, sweats, appetite or  weight changes HENT: Negative for other worsening hearing loss, ear pain, facial swelling, mouth sores or neck stiffness.   Eyes: Negative for other worsening pain, redness or other visual disturbance.  Respiratory: Negative for other stridor or swelling Cardiovascular: Negative for other palpitations or other chest pain  Gastrointestinal: Negative for worsening diarrhea or loose stools, blood in stool, distention or other pain Genitourinary: Negative for hematuria, flank pain or other change in urine volume.  Musculoskeletal: Negative for myalgias or other joint swelling.  Skin: Negative for other color change, or other wound  or worsening drainage.  Neurological: Negative for other syncope or numbness. Hematological: Negative for other adenopathy or swelling Psychiatric/Behavioral: Negative for hallucinations, other worsening agitation, SI, self-injury, or new decreased concentration All other system neg per pt    Objective:   Physical Exam BP 102/68   Pulse 65   Ht 6' (1.829 m)   Wt 223 lb (101.2 kg)   SpO2 99%   BMI 30.24 kg/m  VS noted, obese Constitutional: Pt is oriented to person, place, and time. Appears well-developed and well-nourished, in no significant distress and comfortable Head: Normocephalic and atraumatic  Eyes: Conjunctivae and EOM are normal. Pupils are equal, round, and reactive to light Right Ear: External ear normal without discharge Left Ear: External ear normal without discharge Nose: Nose without discharge or deformity Mouth/Throat: Oropharynx is without other ulcerations and moist  Neck: Normal range of motion. Neck supple. No JVD present. No tracheal deviation present or significant neck LA or mass Cardiovascular: Normal rate, regular rhythm, normal heart sounds and intact distal pulses.   Pulmonary/Chest: WOB normal and breath sounds without rales or wheezing  Abdominal: Soft. Bowel sounds are normal. NT. No HSM  Musculoskeletal: Normal range of motion. Exhibits no edema Lymphadenopathy: Has no other cervical adenopathy.  Neurological: Pt is alert and oriented to person, place, and time. Pt has normal reflexes. No cranial nerve deficit. Motor grossly intact, Gait intact Skin: Skin is warm and dry. No rash noted or new ulcerations Psychiatric:  Has normal mood and affect. Behavior is normal without agitation No other exam findings    Assessment & Plan:

## 2016-11-09 NOTE — Patient Instructions (Signed)

## 2016-11-09 NOTE — Assessment & Plan Note (Signed)
Mild on last yrs labs, for f/u a1c today  r/o DM but suspect may be normal

## 2016-11-10 ENCOUNTER — Telehealth: Payer: Self-pay

## 2016-11-10 ENCOUNTER — Other Ambulatory Visit: Payer: Self-pay | Admitting: Internal Medicine

## 2016-11-10 DIAGNOSIS — E785 Hyperlipidemia, unspecified: Secondary | ICD-10-CM

## 2016-11-10 DIAGNOSIS — R7989 Other specified abnormal findings of blood chemistry: Secondary | ICD-10-CM

## 2016-11-10 MED ORDER — ATORVASTATIN CALCIUM 20 MG PO TABS: 20.0000 mg | ORAL_TABLET | Freq: Every day | ORAL | 3 refills | Status: DC

## 2016-11-10 NOTE — Telephone Encounter (Signed)
-----   Message from Biagio Borg, MD sent at 11/10/2016  9:13 AM EDT ----- Left message on MyChart, pt to cont same tx except  The test results show that your current treatment is OK, except the LDL cholesterol is mildly elevated.  Your diet may not have as strictly low cholesterol as before, or perhaps the pravachol is just not working well.  We should change this lipitor 20 mg per day, as this is more effective than the pravachol 40 mg.    Also, the TSH (thyroid test) is slightly elevated.  We seen this occasionally, and may not actually mean that you have new low thyroid condition.    I would ask that you return to the lab 4 weeks after the medication changes to recheck (no office visit needed).  The LDL should be improved with the goal being less than 100, and if the TSH thyroid test is still even mildly abnormal, you will want to start a very low dose thyroid medication.   There is no need for change of treatment or further evaluation based on these results, at this time.    Shirron to please inform pt, I will do rx and order for labs

## 2016-11-10 NOTE — Telephone Encounter (Signed)
Called pt on personal cell phone, LVM, informed him of phone/fax issue, he could view lab results on MyChart and if he has any questions to write [place] back through there.

## 2016-12-20 ENCOUNTER — Other Ambulatory Visit: Payer: Self-pay | Admitting: Internal Medicine

## 2016-12-22 ENCOUNTER — Other Ambulatory Visit: Payer: Self-pay

## 2016-12-22 MED ORDER — DICLOFENAC SODIUM 50 MG PO TBEC: DELAYED_RELEASE_TABLET | ORAL | 3 refills | Status: DC

## 2017-03-23 ENCOUNTER — Other Ambulatory Visit: Payer: BLUE CROSS/BLUE SHIELD

## 2017-03-23 ENCOUNTER — Other Ambulatory Visit (HOSPITAL_COMMUNITY)
Admission: RE | Admit: 2017-03-23 | Discharge: 2017-03-23 | Disposition: A | Discharge status: Home / Self Care | Payer: BLUE CROSS/BLUE SHIELD | Source: Ambulatory Visit | Attending: Internal Medicine | Admitting: Internal Medicine

## 2017-03-23 DIAGNOSIS — Z113 Encounter for screening for infections with a predominantly sexual mode of transmission: Secondary | ICD-10-CM

## 2017-03-23 DIAGNOSIS — B20 Human immunodeficiency virus [HIV] disease: Secondary | ICD-10-CM | POA: Diagnosis not present

## 2017-03-23 LAB — CBC WITH DIFFERENTIAL/PLATELET
BASOS ABS: 0 {cells}/uL (ref 0–200)
Basophils Relative: 0 %
EOS ABS: 100 {cells}/uL (ref 15–500)
Eosinophils Relative: 2 %
HEMATOCRIT: 40.2 % (ref 38.5–50.0)
HEMOGLOBIN: 13.3 g/dL (ref 13.2–17.1)
LYMPHS ABS: 1900 {cells}/uL (ref 850–3900)
Lymphocytes Relative: 38 %
MCH: 32.9 pg (ref 27.0–33.0)
MCHC: 33.1 g/dL (ref 32.0–36.0)
MCV: 99.5 fL (ref 80.0–100.0)
MONO ABS: 400 {cells}/uL (ref 200–950)
MPV: 10 fL (ref 7.5–12.5)
Monocytes Relative: 8 %
NEUTROS PCT: 52 %
Neutro Abs: 2600 cells/uL (ref 1500–7800)
Platelets: 179 10*3/uL (ref 140–400)
RBC: 4.04 MIL/uL — ABNORMAL LOW (ref 4.20–5.80)
RDW: 13.9 % (ref 11.0–15.0)
WBC: 5 10*3/uL (ref 3.8–10.8)

## 2017-03-23 LAB — COMPLETE METABOLIC PANEL WITH GFR
ALBUMIN: 4.6 g/dL (ref 3.6–5.1)
ALK PHOS: 49 U/L (ref 40–115)
ALT: 29 U/L (ref 9–46)
AST: 32 U/L (ref 10–35)
BUN: 22 mg/dL (ref 7–25)
CO2: 27 mmol/L (ref 20–32)
Calcium: 9.8 mg/dL (ref 8.6–10.3)
Chloride: 104 mmol/L (ref 98–110)
Creat: 1.05 mg/dL (ref 0.70–1.33)
GFR, Est African American: >89 mL/min (ref >=60)
GFR, Est Non African American: 80 mL/min (ref >=60)
GLUCOSE: 113 mg/dL — AB (ref 65–99)
POTASSIUM: 5.3 mmol/L (ref 3.5–5.3)
SODIUM: 139 mmol/L (ref 135–146)
Total Bilirubin: 0.7 mg/dL (ref 0.2–1.2)
Total Protein: 7 g/dL (ref 6.1–8.1)

## 2017-03-24 LAB — T-HELPER CELL (CD4) - (RCID CLINIC ONLY)
CD4 % Helper T Cell: 28 % — ABNORMAL LOW (ref 33–55)
CD4 T Cell Abs: 560 /uL (ref 400–2700)

## 2017-03-24 LAB — RPR

## 2017-03-24 LAB — URINE CYTOLOGY ANCILLARY ONLY
CHLAMYDIA, DNA PROBE: NEGATIVE
NEISSERIA GONORRHEA: NEGATIVE

## 2017-03-29 LAB — HIV-1 RNA QUANT-NO REFLEX-BLD
HIV 1 RNA Quant: <20 copies/mL — AB
HIV-1 RNA Quant, Log: <1.3 Log copies/mL — AB

## 2017-04-06 ENCOUNTER — Encounter: Payer: Self-pay | Admitting: Internal Medicine

## 2017-04-06 ENCOUNTER — Ambulatory Visit (INDEPENDENT_AMBULATORY_CARE_PROVIDER_SITE_OTHER): Payer: BLUE CROSS/BLUE SHIELD | Admitting: Internal Medicine

## 2017-04-06 DIAGNOSIS — B20 Human immunodeficiency virus [HIV] disease: Secondary | ICD-10-CM | POA: Diagnosis not present

## 2017-04-06 DIAGNOSIS — B181 Chronic viral hepatitis B without delta-agent: Secondary | ICD-10-CM | POA: Diagnosis not present

## 2017-04-06 DIAGNOSIS — Z23 Encounter for immunization: Secondary | ICD-10-CM

## 2017-04-06 DIAGNOSIS — R739 Hyperglycemia, unspecified: Secondary | ICD-10-CM | POA: Diagnosis not present

## 2017-04-06 NOTE — Assessment & Plan Note (Signed)
He has had very mild hyperglycemia for the past several years but he does not think that these have been fasting specimens.

## 2017-04-06 NOTE — Progress Notes (Signed)
Patient Active Problem List   Diagnosis Date Noted  . Human immunodeficiency virus (HIV) disease ([place]) 07/28/2006    Priority: High  . Chronic hepatitis B virus infection ([place]) 07/28/2006    Priority: High  . Obesity 09/12/2011    Priority: Medium  . Essential hypertension 07/07/2009    Priority: Medium  . CIGARETTE SMOKER 05/08/2007    Priority: Medium  . GERD 07/28/2006    Priority: Medium  . Hyperglycemia 11/09/2016  . Cough 01/08/2016  . Wheezing 01/08/2016  . Peripheral edema 10/02/2015  . Pain of right heel 06/18/2013  . Preventative health care 05/31/2012  . Hyperlipidemia 05/31/2012  . Carpal tunnel syndrome, right 05/31/2012  . Dizziness 04/19/2011  . LOW BACK PAIN, ACUTE 05/27/2010  . GENITAL HERPES 08/22/2006  . VENEREAL WART 08/22/2006  . ANXIETY DISORDER, GENERALIZED 08/22/2006  . NEPHROLITHIASIS 08/22/2006  . PSORIASIS 08/22/2006  . DEGENERATIVE DISC DISEASE 08/22/2006  . CERVICAL RADICULOPATHY 08/22/2006  . PNEUMONIA 01/22/2006    Patient's Medications  New Prescriptions   No medications on file  Previous Medications   AMLODIPINE-OLMESARTAN (AZOR) 5-40 MG TABLET    Take 1 tablet by mouth daily.   ASPIRIN 81 MG TABLET    Take 81 mg by mouth daily.   ATORVASTATIN (LIPITOR) 20 MG TABLET    Take 1 tablet (20 mg total) by mouth daily.   DICLOFENAC (VOLTAREN) 50 MG EC TABLET    TAKE 1 TABLET TWICE A DAY AS NEEDED FOR INFLAMMATION   GENVOYA 150-150-200-10 MG TABS TABLET    TAKE 1 TABLET BY MOUTH EVERY DAY WITH BREAKFAST   PANTOPRAZOLE (PROTONIX) 40 MG TABLET    TAKE 1 TABLET (40 MG TOTAL) BY MOUTH DAILY.   VALACYCLOVIR (VALTREX) 500 MG TABLET    Take 1 tablet (500 mg total) by mouth 2 (two) times daily.  Modified Medications   No medications on file  Discontinued Medications   No medications on file    Subjective: Anthony Steele is in for his routine HIV follow-up visit. He has had no problems obtaining, tolerating taking his Genvoya. He does not  recall missing any doses. He has been traveling to [facility] and [place] frequently for work. He has not had any concerns about his health other than noting that his blood sugar was elevated on his most recent blood work here. He does have a family history of diabetes.   Review of Systems: Review of Systems  Constitutional: Negative for chills, diaphoresis, fever, malaise/fatigue and weight loss.  HENT: Negative for sore throat.   Respiratory: Negative for cough, sputum production and shortness of breath.   Cardiovascular: Negative for chest pain.  Gastrointestinal: Negative for abdominal pain, diarrhea, heartburn, nausea and vomiting.  Genitourinary: Negative for dysuria and frequency.  Musculoskeletal: Negative for joint pain and myalgias.  Skin: Negative for rash.  Neurological: Negative for dizziness and headaches.  Psychiatric/Behavioral: Negative for depression and substance abuse. The patient is not nervous/anxious.     Past Medical History:  Diagnosis Date  . ANXIETY DISORDER, GENERALIZED 08/22/2006   Qualifier: Diagnosis of  By: [name], Ashonti Leandro    . Carpal tunnel syndrome, right 05/31/2012  . CERVICAL RADICULOPATHY 08/22/2006   Annotation: L arm 2002 Qualifier: Diagnosis of  By: [name], Sherlene Rickel    . DEGENERATIVE [place] DISEASE 08/22/2006   Qualifier: Diagnosis of  By: [name], Aaren Krog    . GENITAL HERPES 08/22/2006   Qualifier: Diagnosis of  By: [name],  Baird Polinski    . GERD 07/28/2006   Qualifier: Diagnosis of  By: [name], Crew Goren    . GERD (gastroesophageal reflux disease)   . HEPATITIS B, CHRONIC 07/28/2006   Qualifier: Diagnosis of  By: [name], Pruitt Taboada    . HIV DISEASE 07/28/2006   Annotation: dx 2000 Qualifier: Diagnosis of  By: [name], Akaylah Lalley    . HIV infection ([place])   . Hyperlipidemia   . Hypertension   . HYPERTENSION NEC 07/07/2009   Qualifier: Diagnosis of  By: [name], Ermine Spofford    . Kidney stones   . NEPHROLITHIASIS 08/22/2006   Annotation: x2, last 1992  Qualifier: Diagnosis of  By: [name], Bryar Dahms    . Obesity 09/12/2011  . PSORIASIS 08/22/2006   Qualifier: Diagnosis of  By: [name], Ralston Venus      Social History  Substance Use Topics  . Smoking status: Former Smoker    Packs/day: 0.10    Types: Cigarettes    Quit date: 07/18/2012  . Smokeless tobacco: Never Used     Comment: quit over cruise over the holidays  . Alcohol use 1.5 oz/week    3 Standard drinks or equivalent per week     Comment: occ    Family History  Problem Relation Age of Onset  . Colon cancer Sister   . Heart disease Other   . Hypertension Other   . Arthritis Other   . Cancer Other        colon cancer  . Diabetes Maternal Aunt     Allergies  Allergen Reactions  . Lisinopril Rash    Objective:  Vitals:   04/06/17 0925  BP: 119/75  Pulse: 67  Temp: 98.5 F (36.9 C)  TempSrc: Oral  Weight: 228 lb (103.4 kg)   Body mass index is 30.92 kg/m.  Physical Exam  Constitutional: He is oriented to person, place, and time.  He is in good spirits as usual.  HENT:  Mouth/Throat: No oropharyngeal exudate.  Eyes: Conjunctivae are normal.  Cardiovascular: Normal rate and regular rhythm.   No murmur heard. Pulmonary/Chest: Effort normal and breath sounds normal.  Abdominal: Soft. He exhibits no mass. There is no tenderness.  Musculoskeletal: Normal range of motion.  Neurological: He is alert and oriented to person, place, and time.  Skin: No rash noted.  Psychiatric: Mood and affect normal.    Lab Results Lab Results  Component Value Date   WBC 5.0 03/23/2017   HGB 13.3 03/23/2017   HCT 40.2 03/23/2017   MCV 99.5 03/23/2017   PLT 179 03/23/2017    Lab Results  Component Value Date   CREATININE 1.05 03/23/2017   BUN 22 03/23/2017   NA 139 03/23/2017   K 5.3 03/23/2017   CL 104 03/23/2017   CO2 27 03/23/2017    Lab Results  Component Value Date   ALT 29 03/23/2017   AST 32 03/23/2017   ALKPHOS 49 03/23/2017   BILITOT 0.7 03/23/2017      Lab Results  Component Value Date   CHOL 197 11/09/2016   HDL 60.50 11/09/2016   LDLCALC 124 (H) 11/09/2016   TRIG 58.0 11/09/2016   CHOLHDL 3 11/09/2016   Lab Results  Component Value Date   LABRPR NON REAC 03/23/2017   HIV 1 RNA Quant (copies/mL)  Date Value  03/23/2017 <20 DETECTED (A)  12/29/2015 <20  06/10/2015 <20   CD4 T Cell Abs (/uL)  Date Value  03/23/2017 560  12/29/2015 380 (L)  06/10/2015  470     Problem List Items Addressed This Visit      High   Chronic hepatitis B virus infection ([place])    His viral load was undetectable last year and his liver enzymes are normal. I will recheck serologic tests before his next visit.      Relevant Orders   Hepatitis B Surface AntiGEN   Hepatitis B Surface AntiBODY   Hepatitis B E Antigen   Hepatitis B E Antibody   Human immunodeficiency virus (HIV) disease ([facility])    His infection is under excellent, long-term control. He will continue Genvoya and follow-up after lab work in one year. He received his influenza vaccination today.      Relevant Orders   CBC   T-helper cell (CD4)- ([facility] only)   Comprehensive metabolic panel   Lipid panel   RPR   HIV 1 RNA quant-no reflex-bld     Unprioritized   Hyperglycemia    He has had very mild hyperglycemia for the past several years but he does not think that these have been fasting specimens.           Michel Bickers, MD [facility] [place] [phone] pager   [phone] cell 04/06/2017, 9:44 AM

## 2017-04-06 NOTE — Assessment & Plan Note (Signed)
His viral load was undetectable last year and his liver enzymes are normal. I will recheck serologic tests before his next visit.

## 2017-04-06 NOTE — Assessment & Plan Note (Signed)
His infection is under excellent, long-term control. He will continue Genvoya and follow-up after lab work in one year. He received his influenza vaccination today.

## 2017-05-25 ENCOUNTER — Other Ambulatory Visit: Payer: Self-pay | Admitting: Internal Medicine

## 2017-05-25 DIAGNOSIS — B20 Human immunodeficiency virus [HIV] disease: Secondary | ICD-10-CM

## 2017-08-10 ENCOUNTER — Encounter: Payer: Self-pay | Admitting: Internal Medicine

## 2017-08-10 NOTE — Telephone Encounter (Signed)
Staff to contact pt - needs OV tomorrow for URI symptoms

## 2017-08-11 ENCOUNTER — Ambulatory Visit: Payer: BLUE CROSS/BLUE SHIELD | Admitting: Internal Medicine

## 2017-08-11 ENCOUNTER — Encounter: Payer: Self-pay | Admitting: Internal Medicine

## 2017-08-11 VITALS — BP 136/88 | HR 75 | Temp 98.4°F | Ht 72.0 in | Wt 224.0 lb

## 2017-08-11 DIAGNOSIS — R062 Wheezing: Secondary | ICD-10-CM | POA: Diagnosis not present

## 2017-08-11 DIAGNOSIS — I10 Essential (primary) hypertension: Secondary | ICD-10-CM | POA: Diagnosis not present

## 2017-08-11 DIAGNOSIS — R05 Cough: Secondary | ICD-10-CM

## 2017-08-11 DIAGNOSIS — R739 Hyperglycemia, unspecified: Secondary | ICD-10-CM | POA: Diagnosis not present

## 2017-08-11 DIAGNOSIS — R059 Cough, unspecified: Secondary | ICD-10-CM

## 2017-08-11 MED ORDER — LEVOFLOXACIN 500 MG PO TABS: 500.0000 mg | ORAL_TABLET | Freq: Every day | ORAL | 0 refills | Status: AC

## 2017-08-11 MED ORDER — METHYLPREDNISOLONE ACETATE 80 MG/ML IJ SUSP
80.0000 mg | Freq: Once | INTRAMUSCULAR | Status: AC
  Administered 2017-08-11: 80 mg via INTRAMUSCULAR

## 2017-08-11 MED ORDER — PREDNISONE 10 MG PO TABS: ORAL_TABLET | ORAL | 0 refills | Status: DC

## 2017-08-11 MED ORDER — HYDROCODONE-HOMATROPINE 5-1.5 MG/5ML PO SYRP
5.0000 mL | ORAL_SOLUTION | Freq: Four times a day (QID) | ORAL | 0 refills | Status: AC | PRN
Start: 2017-08-11 — End: 2017-08-21

## 2017-08-11 MED ORDER — ALBUTEROL SULFATE HFA 108 (90 BASE) MCG/ACT IN AERS: 2.0000 | INHALATION_SPRAY | Freq: Four times a day (QID) | RESPIRATORY_TRACT | 1 refills | Status: DC | PRN

## 2017-08-11 NOTE — Progress Notes (Signed)
Subjective:    Patient ID: Anthony Steele, male    DOB: 10-26-1960, 57 y.o.   MRN: [id number]  HPI    Here with 2-3 days acute onset fever, facial pain, pressure, headache, general weakness and malaise, and greenish bloody d/c, with mild ST and cough, but pt denies chest pain, wheezing, increased sob or doe, orthopnea, PND, increased LE swelling, palpitations, dizziness or syncope, except the onset wheezing, sob since last PM.  Also has moderate pleuritic post left chest pain, intermittent, worse with breathing, better with being more sitting still  Otherwise nonexertional and nopositional.  Pt denies new neurological symptoms such as new headache, or facial or extremity weakness or numbness   Pt denies polydipsia, polyuria  No other interval hx or complaints Past Medical History:  Diagnosis Date  . ANXIETY DISORDER, GENERALIZED 08/22/2006   Qualifier: Diagnosis of  By: [name], Chauntay Paszkiewicz    . Carpal tunnel syndrome, right 05/31/2012  . CERVICAL RADICULOPATHY 08/22/2006   Annotation: L arm 2002 Qualifier: Diagnosis of  By: [name], Raydell Maners    . DEGENERATIVE [place] DISEASE 08/22/2006   Qualifier: Diagnosis of  By: [name], Rada Zegers    . GENITAL HERPES 08/22/2006   Qualifier: Diagnosis of  By: [name], Leylany Nored    . GERD 07/28/2006   Qualifier: Diagnosis of  By: [name], Anitra Doxtater    . GERD (gastroesophageal reflux disease)   . HEPATITIS B, CHRONIC 07/28/2006   Qualifier: Diagnosis of  By: [name], Marieann Zipp    . HIV DISEASE 07/28/2006   Annotation: dx 2000 Qualifier: Diagnosis of  By: [name], Trinisha Paget    . HIV infection ([place])   . Hyperlipidemia   . Hypertension   . HYPERTENSION NEC 07/07/2009   Qualifier: Diagnosis of  By: [name], Landri Dorsainvil    . Kidney stones   . NEPHROLITHIASIS 08/22/2006   Annotation: x2, last 1992 Qualifier: Diagnosis of  By: [name], Jadalynn Burr    . Obesity 09/12/2011  . PSORIASIS 08/22/2006   Qualifier: Diagnosis of  By: [name], Wen Munford     Past Surgical History:  Procedure  Laterality Date  . CYSTECTOMY  2000   cyst removed off buttock cheek    reports that he quit smoking about 5 years ago. His smoking use included cigarettes. He smoked 0.10 packs per day. he has never used smokeless tobacco. He reports that he drinks about 1.5 oz of alcohol per week. He reports that he does not use drugs. family history includes Arthritis in his other; Cancer in his other; Colon cancer in his sister; Diabetes in his maternal aunt; Heart disease in his other; Hypertension in his other. Allergies  Allergen Reactions  . Lisinopril Rash   Current Outpatient Medications on File Prior to Visit  Medication Sig Dispense Refill  . amLODipine-olmesartan (AZOR) 5-40 MG tablet Take 1 tablet by mouth daily. 30 tablet 11  . aspirin 81 MG tablet Take 81 mg by mouth daily.    Marland Kitchen atorvastatin (LIPITOR) 20 MG tablet Take 1 tablet (20 mg total) by mouth daily. 90 tablet 3  . diclofenac (VOLTAREN) 50 MG EC tablet TAKE 1 TABLET TWICE A DAY AS NEEDED FOR INFLAMMATION 60 tablet 3  . GENVOYA 150-150-200-10 MG TABS tablet TAKE 1 TABLET BY MOUTH EVERY DAY WITH BREAKFAST 30 tablet 6  . pantoprazole (PROTONIX) 40 MG tablet TAKE 1 TABLET (40 MG TOTAL) BY MOUTH DAILY. 90 tablet 3  . valACYclovir (VALTREX) 500 MG tablet Take 1 tablet (500 mg total)  by mouth 2 (two) times daily. 180 tablet 3   No current facility-administered medications on file prior to visit.    Review of Systems  Constitutional: Negative for other unusual diaphoresis or sweats HENT: Negative for ear discharge or swelling Eyes: Negative for other worsening visual disturbances Respiratory: Negative for stridor or other swelling  Gastrointestinal: Negative for worsening distension or other blood Genitourinary: Negative for retention or other urinary change Musculoskeletal: Negative for other MSK pain or swelling Skin: Negative for color change or other new lesions Neurological: Negative for worsening tremors and other numbness    Psychiatric/Behavioral: Negative for worsening agitation or other fatigue All other system neg per pt    Objective:   Physical Exam BP 136/88   Pulse 75   Temp 98.4 F (36.9 C) (Oral)   Ht 6' (1.829 m)   Wt 224 lb (101.6 kg)   SpO2 99%   BMI 30.38 kg/m  VS noted, mild ill Constitutional: Pt appears in NAD HENT: Head: NCAT.  Right Ear: External ear normal.  Left Ear: External ear normal.  Eyes: . Pupils are equal, round, and reactive to light. Conjunctivae and EOM are normal Nose: without d/c or deformity Neck: Neck supple. Gross normal ROM Cardiovascular: Normal rate and regular rhythm.   Pulmonary/Chest: Effort normal and breath sounds decreased without rales but with few bilat scattered wheezing.  Neurological: Pt is alert. At baseline orientation, motor grossly intact Skin: Skin is warm. No rashes, other new lesions, no LE edema Psychiatric: Pt behavior is normal without agitation  No other exam findings    Assessment & Plan:

## 2017-08-11 NOTE — Patient Instructions (Signed)
You had the steroid shot today -   Please take all new medication as prescribed - the antibiotic, cough medicine as needed, prednisone, and inhaler if needed  Please continue all other medications as before, and refills have been done if requested.  Please have the pharmacy call with any other refills you may need.  Please keep your appointments with your specialists as you may have planned

## 2017-08-12 NOTE — Assessment & Plan Note (Signed)
Mild to mod, c/w bronchospastic type, for predpac asd, to f/u any worsening symptoms or concerns

## 2017-08-12 NOTE — Assessment & Plan Note (Signed)
stable overall by history and exam, recent data reviewed with pt, and pt to continue medical treatment as before,  to f/u any worsening symptoms or concerns BP Readings from Last 3 Encounters:  08/11/17 136/88  04/06/17 119/75  11/09/16 102/68

## 2017-08-12 NOTE — Assessment & Plan Note (Signed)
Lab Results  Component Value Date   HGBA1C 5.5 11/09/2016  stable overall by history and exam, recent data reviewed with pt, and pt to continue medical treatment as before,  to f/u any worsening symptoms or concerns

## 2017-08-12 NOTE — Assessment & Plan Note (Signed)
Mild to mod, c/w bronchitis vs pna, declines cxr, for antibx course, cough med prn,  to f/u any worsening symptoms or concerns 

## 2017-08-13 ENCOUNTER — Other Ambulatory Visit: Payer: Self-pay | Admitting: Internal Medicine

## 2017-11-05 ENCOUNTER — Other Ambulatory Visit: Payer: Self-pay | Admitting: Internal Medicine

## 2017-11-10 ENCOUNTER — Other Ambulatory Visit: Payer: Self-pay | Admitting: Internal Medicine

## 2017-11-10 ENCOUNTER — Encounter: Payer: BLUE CROSS/BLUE SHIELD | Admitting: Internal Medicine

## 2017-11-10 DIAGNOSIS — A6000 Herpesviral infection of urogenital system, unspecified: Secondary | ICD-10-CM

## 2017-11-11 ENCOUNTER — Other Ambulatory Visit: Payer: Self-pay | Admitting: Internal Medicine

## 2017-11-15 ENCOUNTER — Other Ambulatory Visit (INDEPENDENT_AMBULATORY_CARE_PROVIDER_SITE_OTHER): Payer: BLUE CROSS/BLUE SHIELD

## 2017-11-15 ENCOUNTER — Ambulatory Visit (INDEPENDENT_AMBULATORY_CARE_PROVIDER_SITE_OTHER)
Admission: RE | Admit: 2017-11-15 | Discharge: 2017-11-15 | Disposition: A | Discharge status: Home / Self Care | Payer: BLUE CROSS/BLUE SHIELD | Source: Ambulatory Visit | Attending: Internal Medicine | Admitting: Internal Medicine

## 2017-11-15 ENCOUNTER — Ambulatory Visit (INDEPENDENT_AMBULATORY_CARE_PROVIDER_SITE_OTHER): Payer: BLUE CROSS/BLUE SHIELD | Admitting: Internal Medicine

## 2017-11-15 ENCOUNTER — Encounter: Payer: Self-pay | Admitting: Internal Medicine

## 2017-11-15 VITALS — BP 118/76 | HR 63 | Temp 98.6°F | Ht 72.0 in | Wt 235.0 lb

## 2017-11-15 DIAGNOSIS — R739 Hyperglycemia, unspecified: Secondary | ICD-10-CM

## 2017-11-15 DIAGNOSIS — R079 Chest pain, unspecified: Secondary | ICD-10-CM

## 2017-11-15 DIAGNOSIS — Z0001 Encounter for general adult medical examination with abnormal findings: Secondary | ICD-10-CM | POA: Diagnosis not present

## 2017-11-15 DIAGNOSIS — B20 Human immunodeficiency virus [HIV] disease: Secondary | ICD-10-CM | POA: Diagnosis not present

## 2017-11-15 DIAGNOSIS — R7989 Other specified abnormal findings of blood chemistry: Secondary | ICD-10-CM | POA: Insufficient documentation

## 2017-11-15 DIAGNOSIS — E785 Hyperlipidemia, unspecified: Secondary | ICD-10-CM

## 2017-11-15 DIAGNOSIS — Z800 Family history of malignant neoplasm of digestive organs: Secondary | ICD-10-CM | POA: Insufficient documentation

## 2017-11-15 LAB — HEPATIC FUNCTION PANEL
ALK PHOS: 54 U/L (ref 39–117)
ALT: 26 U/L (ref 0–53)
AST: 31 U/L (ref 0–37)
Albumin: 4.6 g/dL (ref 3.5–5.2)
BILIRUBIN DIRECT: 0.1 mg/dL (ref 0.0–0.3)
BILIRUBIN TOTAL: 0.5 mg/dL (ref 0.2–1.2)
Total Protein: 7.1 g/dL (ref 6.0–8.3)

## 2017-11-15 LAB — URINALYSIS, ROUTINE W REFLEX MICROSCOPIC
BILIRUBIN URINE: NEGATIVE
HGB URINE DIPSTICK: NEGATIVE
Ketones, ur: NEGATIVE
LEUKOCYTES UA: NEGATIVE
NITRITE: NEGATIVE
RBC / HPF: NONE SEEN (ref 0–?)
Specific Gravity, Urine: <=1.005 — AB (ref 1.000–1.030)
TOTAL PROTEIN, URINE-UPE24: NEGATIVE
URINE GLUCOSE: NEGATIVE
UROBILINOGEN UA: 0.2 (ref 0.0–1.0)
WBC, UA: NONE SEEN (ref 0–?)
pH: 7 (ref 5.0–8.0)

## 2017-11-15 LAB — CBC WITH DIFFERENTIAL/PLATELET
BASOS ABS: 0 10*3/uL (ref 0.0–0.1)
Basophils Relative: 0.8 % (ref 0.0–3.0)
EOS ABS: 0.1 10*3/uL (ref 0.0–0.7)
Eosinophils Relative: 2 % (ref 0.0–5.0)
HCT: 39.7 % (ref 39.0–52.0)
Hemoglobin: 13.5 g/dL (ref 13.0–17.0)
LYMPHS ABS: 1.9 10*3/uL (ref 0.7–4.0)
LYMPHS PCT: 31 % (ref 12.0–46.0)
MCHC: 34 g/dL (ref 30.0–36.0)
MCV: 97.3 fl (ref 78.0–100.0)
Monocytes Absolute: 0.5 10*3/uL (ref 0.1–1.0)
Monocytes Relative: 8.1 % (ref 3.0–12.0)
NEUTROS ABS: 3.5 10*3/uL (ref 1.4–7.7)
NEUTROS PCT: 58.1 % (ref 43.0–77.0)
PLATELETS: 181 10*3/uL (ref 150.0–400.0)
RBC: 4.08 Mil/uL — ABNORMAL LOW (ref 4.22–5.81)
RDW: 13.3 % (ref 11.5–15.5)
WBC: 6.1 10*3/uL (ref 4.0–10.5)

## 2017-11-15 LAB — BASIC METABOLIC PANEL
BUN: 23 mg/dL (ref 6–23)
CHLORIDE: 105 meq/L (ref 96–112)
CO2: 25 mEq/L (ref 19–32)
CREATININE: 1.01 mg/dL (ref 0.40–1.50)
Calcium: 9.5 mg/dL (ref 8.4–10.5)
GFR: 81.35 mL/min (ref >60.00)
Glucose, Bld: 116 mg/dL — ABNORMAL HIGH (ref 70–99)
Potassium: 4.7 mEq/L (ref 3.5–5.1)
Sodium: 138 mEq/L (ref 135–145)

## 2017-11-15 LAB — LIPID PANEL
Cholesterol: 167 mg/dL (ref 0–200)
HDL: 55.2 mg/dL (ref >39.00)
LDL CALC: 101 mg/dL — AB (ref 0–99)
NonHDL: 111.56
TRIGLYCERIDES: 55 mg/dL (ref 0.0–149.0)
Total CHOL/HDL Ratio: 3
VLDL: 11 mg/dL (ref 0.0–40.0)

## 2017-11-15 LAB — PSA: PSA: 0.19 ng/mL (ref 0.10–4.00)

## 2017-11-15 LAB — HEMOGLOBIN A1C: HEMOGLOBIN A1C: 5.5 % (ref 4.6–6.5)

## 2017-11-15 LAB — TSH: TSH: 3.82 u[IU]/mL (ref 0.35–4.50)

## 2017-11-15 MED ORDER — ATORVASTATIN CALCIUM 20 MG PO TABS: 20.0000 mg | ORAL_TABLET | Freq: Every day | ORAL | 3 refills | Status: DC

## 2017-11-15 MED ORDER — DICLOFENAC SODIUM 1 % TD GEL: 4.0000 g | Freq: Four times a day (QID) | TRANSDERMAL | 3 refills | Status: DC | PRN

## 2017-11-15 MED ORDER — AMLODIPINE-OLMESARTAN 5-40 MG PO TABS: 1.0000 | ORAL_TABLET | Freq: Every day | ORAL | 3 refills | Status: DC

## 2017-11-15 MED ORDER — PANTOPRAZOLE SODIUM 40 MG PO TBEC: 40.0000 mg | DELAYED_RELEASE_TABLET | Freq: Every day | ORAL | 3 refills | Status: DC

## 2017-11-15 NOTE — Progress Notes (Signed)
Subjective:    Patient ID: Anthony Steele, male    DOB: [birth year], 57 y.o.   MRN: [id number]  HPI  Here for wellness and f/u;  Overall doing ok;  Pt denies worsening SOB, DOE, wheezing, orthopnea, PND, worsening LE edema, palpitations, dizziness or syncope.  Pt denies neurological change such as new headache, facial or extremity weakness.  Pt denies polydipsia, polyuria, or low sugar symptoms. Pt states overall good compliance with treatment and medications, good tolerability, and has been trying to follow appropriate diet.  Pt denies worsening depressive symptoms, suicidal ideation or panic. No fever, night sweats, wt loss, loss of appetite, or other constitutional symptoms.  Pt states good ability with ADL's, has low fall risk, home safety reviewed and adequate, no other significant changes in hearing or vision, and only occasionally active with exercise. Wt Readings from Last 3 Encounters:  11/15/17 235 lb (106.6 kg)  08/11/17 224 lb (101.6 kg)  04/06/17 228 lb (103.4 kg)   BP Readings from Last 3 Encounters:  11/15/17 118/76  08/11/17 136/88  04/06/17 119/75  Denies hyper or hypo thyroid symptoms such as voice, skin or hair change. Also has had 2 months intermittent mild to mod sharp low SSCP without radiation, sob, diaphoresis, n/v, palp or dizziness; not clearly tender to touch, not clearly pleuritic, exertional or positional.  Denies worsening reflux, abd pain, dysphagia, n/v, bowel change or blood.  No prior hx of same  Has gained significant wt with less activity and increased calories Past Medical History:  Diagnosis Date  . ANXIETY DISORDER, GENERALIZED 08/22/2006   Qualifier: Diagnosis of  By: [name], Mckoy Bhakta    . Carpal tunnel syndrome, right 05/31/2012  . CERVICAL RADICULOPATHY 08/22/2006   Annotation: L arm 2002 Qualifier: Diagnosis of  By: [name], Meliza Kage    . DEGENERATIVE [place] DISEASE 08/22/2006   Qualifier: Diagnosis of  By: [name], Malesha Suliman    . GENITAL HERPES  08/22/2006   Qualifier: Diagnosis of  By: [name], Sean Macwilliams    . GERD 07/28/2006   Qualifier: Diagnosis of  By: [name], Braylan Faul    . GERD (gastroesophageal reflux disease)   . HEPATITIS B, CHRONIC 07/28/2006   Qualifier: Diagnosis of  By: [name], Kruz Chiu    . HIV DISEASE 07/28/2006   Annotation: dx 2000 Qualifier: Diagnosis of  By: [name], Sayer Masini    . HIV infection ([place])   . Hyperlipidemia   . Hypertension   . HYPERTENSION NEC 07/07/2009   Qualifier: Diagnosis of  By: [name], Kaelan Amble    . Kidney stones   . NEPHROLITHIASIS 08/22/2006   Annotation: x2, last 1992 Qualifier: Diagnosis of  By: [name], Beatriz Settles    . Obesity 09/12/2011  . PSORIASIS 08/22/2006   Qualifier: Diagnosis of  By: [name], Thorsten Climer     Past Surgical History:  Procedure Laterality Date  . CYSTECTOMY  2000   cyst removed off buttock cheek    reports that he quit smoking about 5 years ago. His smoking use included cigarettes. He smoked 0.10 packs per day. He has never used smokeless tobacco. He reports that he drinks about 1.5 oz of alcohol per week. He reports that he does not use drugs. family history includes Arthritis in his other; Cancer in his other; Colon cancer in his sister; Diabetes in his maternal aunt; Heart disease in his other; Hypertension in his other. Allergies  Allergen Reactions  . Lisinopril Rash   Current Outpatient Medications on File Prior to  Visit  Medication Sig Dispense Refill  . albuterol (PROVENTIL HFA;VENTOLIN HFA) 108 (90 Base) MCG/ACT inhaler Inhale 2 puffs into the lungs every 6 (six) hours as needed for wheezing or shortness of breath. 1 Inhaler 1  . aspirin 81 MG tablet Take 81 mg by mouth daily.    . diclofenac (VOLTAREN) 50 MG EC tablet TAKE 1 TABLET TWICE A DAY AS NEEDED FOR INFLAMMATION 60 tablet 3  . GENVOYA 150-150-200-10 MG TABS tablet TAKE 1 TABLET BY MOUTH EVERY DAY WITH BREAKFAST 30 tablet 6  . valACYclovir (VALTREX) 500 MG tablet TAKE 1 TABLET (500 MG TOTAL) BY MOUTH 2  (TWO) TIMES DAILY. 180 tablet 0   No current facility-administered medications on file prior to visit.    Review of Systems Constitutional: Negative for other unusual diaphoresis, sweats, appetite or weight changes HENT: Negative for other worsening hearing loss, ear pain, facial swelling, mouth sores or neck stiffness.   Eyes: Negative for other worsening pain, redness or other visual disturbance.  Respiratory: Negative for other stridor or swelling Cardiovascular: Negative for other palpitations or other chest pain  Gastrointestinal: Negative for worsening diarrhea or loose stools, blood in stool, distention or other pain Genitourinary: Negative for hematuria, flank pain or other change in urine volume.  Musculoskeletal: Negative for myalgias or other joint swelling.  Skin: Negative for other color change, or other wound or worsening drainage.  Neurological: Negative for other syncope or numbness. Hematological: Negative for other adenopathy or swelling Psychiatric/Behavioral: Negative for hallucinations, other worsening agitation, SI, self-injury, or new decreased concentration All other system neg per pt    Objective:   Physical Exam BP 118/76   Pulse 63   Temp 98.6 F (37 C) (Oral)   Ht 6' (1.829 m)   Wt 235 lb (106.6 kg)   SpO2 97%   BMI 31.87 kg/m  VS noted,  Constitutional: Pt is oriented to person, place, and time. Appears well-developed and well-nourished, in no significant distress and comfortable Head: Normocephalic and atraumatic  Eyes: Conjunctivae and EOM are normal. Pupils are equal, round, and reactive to light Right Ear: External ear normal without discharge Left Ear: External ear normal without discharge Nose: Nose without discharge or deformity Mouth/Throat: Oropharynx is without other ulcerations and moist  Neck: Normal range of motion. Neck supple. No JVD present. No tracheal deviation present or significant neck LA or mass Cardiovascular: Normal rate,  regular rhythm, normal heart sounds and intact distal pulses.   Pulmonary/Chest: WOB normal and breath sounds without rales or wheezing  Abdominal: Soft. Bowel sounds are normal. NT. No HSM  Musculoskeletal: Normal range of motion. Exhibits no edema Lymphadenopathy: Has no other cervical adenopathy.  Neurological: Pt is alert and oriented to person, place, and time. Pt has normal reflexes. No cranial nerve deficit. Motor grossly intact, Gait intact Skin: Skin is warm and dry. No rash noted or new ulcerations Psychiatric:  Has mild nervous mood and affect. Behavior is normal without agitation No other exam findings  ECG today I have personally interpreted Sinus Bradycardia 52    Assessment & Plan:

## 2017-11-15 NOTE — Patient Instructions (Addendum)
You will be contacted regarding the referral for: colonoscopy, and stress testing  Please take all new medication as prescribed - the voltaren gel for pain  Your EKG was OK today  Please continue all other medications as before, and refills have been done if requested.  Please have the pharmacy call with any other refills you may need.  Please continue your efforts at being more active, low cholesterol diet, and weight control.  You are otherwise up to date with prevention measures today.  Please keep your appointments with your specialists as you may have planned  Please go to the XRAY Department in the Basement (go straight as you get off the elevator) for the x-ray testing  Please go to the LAB in the Basement (turn left off the elevator) for the tests to be done today  You will be contacted by phone if any changes need to be made immediately.  Otherwise, you will receive a letter about your results with an explanation, but please check with MyChart first.  Please remember to sign up for MyChart if you have not done so, as this will be important to you in the future with finding out test results, communicating by private email, and scheduling acute appointments online when needed.  Please return in 1 year for your yearly visit, or sooner if needed, with Lab testing done 3-5 days before

## 2017-11-16 ENCOUNTER — Encounter: Payer: Self-pay | Admitting: Internal Medicine

## 2017-11-16 NOTE — Assessment & Plan Note (Addendum)
Atypical, etiology unclear, ecg reviewed, for cxr, for stress testing, cont same other tx,  to f/u any worsening symptoms or concerns  In addition to the time spent performing CPE, I spent an additional 25 minutes face to face,in which greater than 50% of this time was spent in counseling and coordination of care for patient's acute illness as documented, including the differential dx, treatment, further evaluation and other management of chest pain, FH colon ca, abnormal TSH, hyperglycemia, HLD, and HIV

## 2017-11-16 NOTE — Assessment & Plan Note (Signed)
Sister with colon ca at 57yo, pt for colonoscopy

## 2017-11-16 NOTE — Assessment & Plan Note (Signed)
stable overall by history and exam, recent data reviewed with pt, and pt to continue medical treatment as before,  to f/u any worsening symptoms or concerns Lab Results  Component Value Date   HGBA1C 5.5 11/15/2017

## 2017-11-16 NOTE — Assessment & Plan Note (Signed)
To cont f/u with ID

## 2017-11-16 NOTE — Assessment & Plan Note (Signed)

## 2017-11-16 NOTE — Assessment & Plan Note (Signed)
stable overall by history and exam, recent data reviewed with pt, and pt to continue medical treatment as before,  to f/u any worsening symptoms or concerns Lab Results  Component Value Date   LDLCALC 101 (H) 11/15/2017

## 2017-11-17 ENCOUNTER — Encounter: Payer: Self-pay | Admitting: Internal Medicine

## 2017-11-18 ENCOUNTER — Encounter: Payer: Self-pay | Admitting: Internal Medicine

## 2017-11-21 MED ORDER — DICLOFENAC SODIUM 50 MG PO TBEC: DELAYED_RELEASE_TABLET | ORAL | 3 refills | Status: DC

## 2017-11-21 NOTE — Addendum Note (Signed)
Addended by: Juliet Rude on: 11/21/2017 11:57 AM   Modules accepted: Orders

## 2017-11-24 ENCOUNTER — Telehealth (HOSPITAL_COMMUNITY): Payer: Self-pay | Admitting: Internal Medicine

## 2017-11-24 ENCOUNTER — Telehealth: Payer: Self-pay

## 2017-11-24 MED ORDER — DICLOFENAC SODIUM 1 % TD GEL: 4.0000 g | Freq: Four times a day (QID) | TRANSDERMAL | 3 refills | Status: DC | PRN

## 2017-11-24 NOTE — Telephone Encounter (Signed)
Done  Copied from [place] [id number]. Topic: Quick Communication - Rx Refill/Question >> Nov 24, 2017  1:34 PM Mcneil, Ja-Kwan wrote: Medication: diclofenac sodium (VOLTAREN) 1 % GEL  Crystal with [facility]  [facility] called in requesting Rx be re sent to [facility] location.  Preferred Pharmacy (with phone number or street name): [place] [address] ph#[phone]  fax# [phone] Agent: Please be advised that RX refills may take up to 3 business days. We ask that you follow-up with your pharmacy.

## 2017-11-28 NOTE — Telephone Encounter (Signed)
  11/24/2017 12:40 PM Phone (Outgoing) Anthony Steele, Anthony Steele (Self) [phone] (H)   Left Message - Called pt and lmsg for him to CB to get sch for a myoview..RG    By Cherie Dark A    11/27/2017 02:49 PM Phone ([place]) Anthony Steele, Anthony Steele (Self) [phone] (H)   Left Message - Called pt and lmsg for him to Cb to get sch for a myoview..    By Cherie Dark A    11/28/2017 11:29 AM Phone (Outgoing) Anthony Steele, Anthony Steele (Self) [phone] (H) Remove  Left Message - Called pt and lmsg for him to CB to get sch for a myoview..RG    By Verdene Rio

## 2017-11-30 ENCOUNTER — Encounter: Payer: Self-pay | Admitting: Internal Medicine

## 2017-11-30 MED ORDER — DICLOFENAC SODIUM 1 % TD GEL: 4.0000 g | Freq: Four times a day (QID) | TRANSDERMAL | 3 refills | Status: DC | PRN

## 2017-12-01 ENCOUNTER — Other Ambulatory Visit: Payer: Self-pay | Admitting: Internal Medicine

## 2017-12-01 DIAGNOSIS — B20 Human immunodeficiency virus [HIV] disease: Secondary | ICD-10-CM

## 2017-12-07 ENCOUNTER — Encounter: Payer: Self-pay | Admitting: Internal Medicine

## 2018-02-06 ENCOUNTER — Ambulatory Visit (AMBULATORY_SURGERY_CENTER): Payer: BLUE CROSS/BLUE SHIELD

## 2018-02-06 VITALS — Ht 71.0 in | Wt 232.6 lb

## 2018-02-06 DIAGNOSIS — Z86010 Personal history of colonic polyps: Secondary | ICD-10-CM

## 2018-02-06 DIAGNOSIS — Z800 Family history of malignant neoplasm of digestive organs: Secondary | ICD-10-CM

## 2018-02-06 MED ORDER — NA SULFATE-K SULFATE-MG SULF 17.5-3.13-1.6 GM/177ML PO SOLN
1.0000 | Freq: Once | ORAL | 0 refills | Status: AC
Start: 2018-02-06 — End: 2018-02-06

## 2018-02-06 NOTE — Progress Notes (Signed)
Per pt, no allergies to soy or egg products.Pt not taking any weight loss meds or using  O2 at home.  Pt refused emmi video. 

## 2018-02-12 ENCOUNTER — Other Ambulatory Visit: Payer: Self-pay | Admitting: Internal Medicine

## 2018-02-20 ENCOUNTER — Encounter: Payer: BLUE CROSS/BLUE SHIELD | Admitting: Internal Medicine

## 2018-03-22 ENCOUNTER — Encounter: Payer: Self-pay | Admitting: Internal Medicine

## 2018-03-27 ENCOUNTER — Other Ambulatory Visit: Payer: BLUE CROSS/BLUE SHIELD

## 2018-03-28 ENCOUNTER — Other Ambulatory Visit: Payer: BLUE CROSS/BLUE SHIELD

## 2018-03-28 DIAGNOSIS — B20 Human immunodeficiency virus [HIV] disease: Secondary | ICD-10-CM | POA: Diagnosis not present

## 2018-03-29 LAB — T-HELPER CELL (CD4) - (RCID CLINIC ONLY)
CD4 T CELL HELPER: 30 % — AB (ref 33–55)
CD4 T Cell Abs: 790 /uL (ref 400–2700)

## 2018-03-30 LAB — COMPREHENSIVE METABOLIC PANEL
AG Ratio: 2.2 (calc) (ref 1.0–2.5)
ALKALINE PHOSPHATASE (APISO): 56 U/L (ref 40–115)
ALT: 26 U/L (ref 9–46)
AST: 27 U/L (ref 10–35)
Albumin: 4.9 g/dL (ref 3.6–5.1)
BILIRUBIN TOTAL: 0.6 mg/dL (ref 0.2–1.2)
BUN: 20 mg/dL (ref 7–25)
CO2: 30 mmol/L (ref 20–32)
Calcium: 10.2 mg/dL (ref 8.6–10.3)
Chloride: 102 mmol/L (ref 98–110)
Creat: 1.29 mg/dL (ref 0.70–1.33)
Globulin: 2.2 g/dL (calc) (ref 1.9–3.7)
Glucose, Bld: 106 mg/dL — ABNORMAL HIGH (ref 65–99)
POTASSIUM: 4.5 mmol/L (ref 3.5–5.3)
Sodium: 141 mmol/L (ref 135–146)
Total Protein: 7.1 g/dL (ref 6.1–8.1)

## 2018-03-30 LAB — HIV-1 RNA QUANT-NO REFLEX-BLD
HIV 1 RNA Quant: <20 copies/mL — AB
HIV-1 RNA Quant, Log: <1.3 Log copies/mL — AB

## 2018-03-30 LAB — LIPID PANEL
CHOLESTEROL: 167 mg/dL (ref <200)
HDL: 51 mg/dL (ref >40)
LDL Cholesterol (Calc): 88 mg/dL (calc)
Non-HDL Cholesterol (Calc): 116 mg/dL (calc) (ref <130)
TRIGLYCERIDES: 190 mg/dL — AB (ref <150)
Total CHOL/HDL Ratio: 3.3 (calc) (ref <5.0)

## 2018-03-30 LAB — CBC
HCT: 40 % (ref 38.5–50.0)
Hemoglobin: 13.5 g/dL (ref 13.2–17.1)
MCH: 33.4 pg — AB (ref 27.0–33.0)
MCHC: 33.8 g/dL (ref 32.0–36.0)
MCV: 99 fL (ref 80.0–100.0)
MPV: 10.7 fL (ref 7.5–12.5)
Platelets: 188 10*3/uL (ref 140–400)
RBC: 4.04 10*6/uL — ABNORMAL LOW (ref 4.20–5.80)
RDW: 12.5 % (ref 11.0–15.0)
WBC: 6.8 10*3/uL (ref 3.8–10.8)

## 2018-03-30 LAB — RPR: RPR Ser Ql: NONREACTIVE

## 2018-04-03 ENCOUNTER — Ambulatory Visit (AMBULATORY_SURGERY_CENTER): Payer: BLUE CROSS/BLUE SHIELD | Admitting: Internal Medicine

## 2018-04-03 ENCOUNTER — Encounter: Payer: Self-pay | Admitting: Internal Medicine

## 2018-04-03 VITALS — BP 102/69 | HR 65 | Temp 99.1°F | Resp 19 | Ht 71.0 in | Wt 232.0 lb

## 2018-04-03 DIAGNOSIS — D124 Benign neoplasm of descending colon: Secondary | ICD-10-CM | POA: Diagnosis not present

## 2018-04-03 DIAGNOSIS — Z86010 Personal history of colonic polyps: Secondary | ICD-10-CM | POA: Diagnosis not present

## 2018-04-03 DIAGNOSIS — Z800 Family history of malignant neoplasm of digestive organs: Secondary | ICD-10-CM

## 2018-04-03 MED ORDER — SODIUM CHLORIDE 0.9 % IV SOLN: 500.0000 mL | Freq: Once | INTRAVENOUS | Status: DC

## 2018-04-03 NOTE — Op Note (Signed)
[place] Patient Name: Anthony Steele Procedure Date: 04/03/2018 2:47 PM MRN: [id number] Endoscopist: Jerene Bears , MD Age: 57 Referring MD:  Date of Birth: 12-11-1960 Gender: Male Account #: [id number] Procedure:                Colonoscopy Indications:              Surveillance: Personal history of adenomatous                            polyps on last colonoscopy 5 years ago, Family                            history of colon cancer in a first-degree relative Medicines:                Monitored Anesthesia Care Procedure:                Pre-Anesthesia Assessment:                           - Prior to the procedure, a History and Physical                            was performed, and patient medications and                            allergies were reviewed. The patient's tolerance of                            previous anesthesia was also reviewed. The risks                            and benefits of the procedure and the sedation                            options and risks were discussed with the patient.                            All questions were answered, and informed consent                            was obtained. Prior Anticoagulants: The patient has                            taken no previous anticoagulant or antiplatelet                            agents. ASA Grade Assessment: II - A patient with                            mild systemic disease. After reviewing the risks                            and benefits, the patient was deemed in  satisfactory condition to undergo the procedure.                           After obtaining informed consent, the colonoscope                            was passed under direct vision. Throughout the                            procedure, the patient's blood pressure, pulse, and                            oxygen saturations were monitored continuously. The                            Model CF-HQ190L  [id number]) scope was introduced                            through the anus and advanced to the cecum,                            identified by appendiceal orifice and ileocecal                            valve. The colonoscopy was performed without                            difficulty. The patient tolerated the procedure                            well. The quality of the bowel preparation was good. Scope In: 3:07:40 PM Scope Out: 3:22:32 PM Scope Withdrawal Time: 0 hours 10 minutes 45 seconds  Total Procedure Duration: 0 hours 14 minutes 52 seconds  Findings:                 The digital rectal exam was normal.                           A 5 mm polyp was found in the descending colon. The                            polyp was sessile. The polyp was removed with a                            cold snare. Resection and retrieval were complete.                           Multiple small and large-mouthed diverticula were                            found in the sigmoid colon.                           Internal hemorrhoids were found during  retroflexion. The hemorrhoids were small. Complications:            No immediate complications. Estimated Blood Loss:     Estimated blood loss was minimal. Impression:               - One 5 mm polyp in the descending colon, removed                            with a cold snare. Resected and retrieved.                           - Moderate diverticulosis in the sigmoid colon.                           - Small internal hemorrhoids. Recommendation:           - Patient has a contact number available for                            emergencies. The signs and symptoms of potential                            delayed complications were discussed with the                            patient. Return to normal activities tomorrow.                            Written discharge instructions were provided to the                            patient.                            - Resume previous diet.                           - Continue present medications.                           - Await pathology results.                           - Repeat colonoscopy in 5 years for surveillance. Jerene Bears, MD 04/03/2018 3:27:11 PM This report has been signed electronically.

## 2018-04-03 NOTE — Progress Notes (Signed)
Called to room to assist during endoscopic procedure.  Patient ID and intended procedure confirmed with present staff. Received instructions for my participation in the procedure from the performing physician.  

## 2018-04-03 NOTE — Progress Notes (Signed)
Pt's states no medical or surgical changes since previsit or office visit. 

## 2018-04-03 NOTE — Patient Instructions (Signed)
Impression/Recommendations:  Polyp handout given to patient. Diverticulosis handout given to patient. Hemorrhoid handout given to patient.  Resume previous diet. Continue present medications.  Await pathology results.  Repeat colonoscopy in 5 years for surveillance.  YOU HAD AN ENDOSCOPIC PROCEDURE TODAY AT [facility]:   Refer to the procedure report that was given to you for any specific questions about what was found during the examination.  If the procedure report does not answer your questions, please call your gastroenterologist to clarify.  If you requested that your care partner not be given the details of your procedure findings, then the procedure report has been included in a sealed envelope for you to review at your convenience later.  YOU SHOULD EXPECT: Some feelings of bloating in the abdomen. Passage of more gas than usual.  Walking can help get rid of the air that was put into your GI tract during the procedure and reduce the bloating. If you had a lower endoscopy (such as a colonoscopy or flexible sigmoidoscopy) you may notice spotting of blood in your stool or on the toilet paper. If you underwent a bowel prep for your procedure, you may not have a normal bowel movement for a few days.  Please Note:  You might notice some irritation and congestion in your nose or some drainage.  This is from the oxygen used during your procedure.  There is no need for concern and it should clear up in a day or so.  SYMPTOMS TO REPORT IMMEDIATELY:   Following lower endoscopy (colonoscopy or flexible sigmoidoscopy):  Excessive amounts of blood in the stool  Significant tenderness or worsening of abdominal pains  Swelling of the abdomen that is new, acute  Fever of 100F or higher For urgent or emergent issues, a gastroenterologist can be reached at any hour by calling [phone].   DIET:  We do recommend a small meal at first, but then you may proceed to your regular  diet.  Drink plenty of fluids but you should avoid alcoholic beverages for 24 hours.  ACTIVITY:  You should plan to take it easy for the rest of today and you should NOT DRIVE or use heavy machinery until tomorrow (because of the sedation medicines used during the test).    FOLLOW UP: Our staff will call the number listed on your records the next business day following your procedure to check on you and address any questions or concerns that you may have regarding the information given to you following your procedure. If we do not reach you, we will leave a message.  However, if you are feeling well and you are not experiencing any problems, there is no need to return our call.  We will assume that you have returned to your regular daily activities without incident.  If any biopsies were taken you will be contacted by phone or by letter within the next 1-3 weeks.  Please call us at [phone] if you have not heard about the biopsies in 3 weeks.    SIGNATURES/CONFIDENTIALITY: You and/or your care partner have signed paperwork which will be entered into your electronic medical record.  These signatures attest to the fact that that the information above on your After Visit Summary has been reviewed and is understood.  Full responsibility of the confidentiality of this discharge information lies with you and/or your care-partner.

## 2018-04-03 NOTE — Progress Notes (Signed)
Spontaneous respirations throughout. VSS. Resting comfortably. To PACU on room air. Report to  RN. 

## 2018-04-04 ENCOUNTER — Telehealth: Payer: Self-pay | Admitting: *Deleted

## 2018-04-04 NOTE — Telephone Encounter (Signed)
  Follow up Call-  Call back number 04/03/2018  Post procedure Call Back phone  # [phone]  Permission to leave phone message Yes  Some recent data might be hidden     Patient questions:  Do you have a fever, pain , or abdominal swelling? No. Pain Score  0 *  Have you tolerated food without any problems? Yes.    Have you been able to return to your normal activities? Yes.    Do you have any questions about your discharge instructions: Diet   No. Medications  No. Follow up visit  No.  Do you have questions or concerns about your Care? No.  Actions: * If pain score is 4 or above: No action needed, pain <4.

## 2018-04-10 ENCOUNTER — Encounter: Payer: Self-pay | Admitting: Internal Medicine

## 2018-04-10 ENCOUNTER — Ambulatory Visit: Payer: BLUE CROSS/BLUE SHIELD | Admitting: Internal Medicine

## 2018-04-11 ENCOUNTER — Ambulatory Visit (INDEPENDENT_AMBULATORY_CARE_PROVIDER_SITE_OTHER): Payer: BLUE CROSS/BLUE SHIELD | Admitting: Internal Medicine

## 2018-04-11 ENCOUNTER — Encounter: Payer: Self-pay | Admitting: Internal Medicine

## 2018-04-11 DIAGNOSIS — Z23 Encounter for immunization: Secondary | ICD-10-CM

## 2018-04-11 DIAGNOSIS — B181 Chronic viral hepatitis B without delta-agent: Secondary | ICD-10-CM | POA: Diagnosis not present

## 2018-04-11 DIAGNOSIS — B20 Human immunodeficiency virus [HIV] disease: Secondary | ICD-10-CM

## 2018-04-11 NOTE — Assessment & Plan Note (Signed)
Infection remains under excellent, long-term control.  He will continue Genvoya and follow-up after lab work in 1 year.

## 2018-04-11 NOTE — Assessment & Plan Note (Addendum)
I will repeat his hepatitis B blood work before his next visit in 1 year.

## 2018-04-11 NOTE — Progress Notes (Signed)
Patient Active Problem List   Diagnosis Date Noted  . Human immunodeficiency virus (HIV) disease ([place]) 07/28/2006    Priority: High  . Chronic hepatitis B virus infection ([place]) 07/28/2006    Priority: High  . Obesity 09/12/2011    Priority: Medium  . Essential hypertension 07/07/2009    Priority: Medium  . CIGARETTE SMOKER 05/08/2007    Priority: Medium  . GERD 07/28/2006    Priority: Medium  . Family history of colon cancer 11/15/2017  . Chest pain 11/15/2017  . Abnormal TSH 11/15/2017  . Hyperglycemia 11/09/2016  . Cough 01/08/2016  . Wheezing 01/08/2016  . Peripheral edema 10/02/2015  . Pain of right heel 06/18/2013  . Encounter for well adult exam with abnormal findings 05/31/2012  . Hyperlipidemia 05/31/2012  . Carpal tunnel syndrome, right 05/31/2012  . Dizziness 04/19/2011  . LOW BACK PAIN, ACUTE 05/27/2010  . GENITAL HERPES 08/22/2006  . VENEREAL WART 08/22/2006  . ANXIETY DISORDER, GENERALIZED 08/22/2006  . NEPHROLITHIASIS 08/22/2006  . PSORIASIS 08/22/2006  . DEGENERATIVE DISC DISEASE 08/22/2006  . CERVICAL RADICULOPATHY 08/22/2006  . PNEUMONIA 01/22/2006    Patient's Medications  New Prescriptions   No medications on file  Previous Medications   ALBUTEROL (PROVENTIL HFA;VENTOLIN HFA) 108 (90 BASE) MCG/ACT INHALER    Inhale 2 puffs into the lungs every 6 (six) hours as needed for wheezing or shortness of breath.   AMLODIPINE-OLMESARTAN (AZOR) 5-40 MG TABLET    Take 1 tablet by mouth daily.   ASPIRIN 81 MG TABLET    Take 81 mg by mouth daily.   ATORVASTATIN (LIPITOR) 20 MG TABLET    Take 1 tablet (20 mg total) by mouth daily.   DICLOFENAC (VOLTAREN) 50 MG EC TABLET    TAKE 1 TABLET TWICE A DAY AS NEEDED FOR INFLAMMATION   DICLOFENAC SODIUM (VOLTAREN) 1 % GEL    Apply 4 g topically 4 (four) times daily as needed.   GENVOYA 150-150-200-10 MG TABS TABLET    TAKE 1 TABLET BY MOUTH EVERY DAY WITH BREAKFAST   PANTOPRAZOLE (PROTONIX) 40 MG TABLET     Take 1 tablet (40 mg total) by mouth daily.   VALACYCLOVIR (VALTREX) 500 MG TABLET    TAKE 1 TABLET (500 MG TOTAL) BY MOUTH 2 (TWO) TIMES DAILY.  Modified Medications   No medications on file  Discontinued Medications   No medications on file    Subjective: Anthony Steele is in for his routine HIV follow-up visit.  He has had no problems obtaining, taking or tolerating his Genvoya.  His only missed dose was last week when he was n.p.o. for his colonoscopy.  He continues to travel to [place] for work.  He is having a little flare of his psoriasis but otherwise is feeling well.  Review of Systems: Review of Systems  Constitutional: Negative for chills, diaphoresis, fever, malaise/fatigue and weight loss.  HENT: Negative for sore throat.   Respiratory: Negative for cough, sputum production and shortness of breath.   Cardiovascular: Negative for chest pain.  Gastrointestinal: Negative for abdominal pain, diarrhea, heartburn, nausea and vomiting.  Genitourinary: Negative for dysuria and frequency.  Musculoskeletal: Negative for joint pain and myalgias.  Skin: Positive for rash.  Neurological: Negative for dizziness and headaches.  Psychiatric/Behavioral: Negative for depression and substance abuse. The patient is not nervous/anxious.     Past Medical History:  Diagnosis Date  . ANXIETY DISORDER, GENERALIZED 08/22/2006   Qualifier: Diagnosis of  By:  [name], Jenny Reichmann    . Carpal tunnel syndrome, right 05/31/2012  . CERVICAL RADICULOPATHY 08/22/2006   Annotation: L arm 2002 Qualifier: Diagnosis of  By: [name], Nelta Caudill    . DEGENERATIVE [place] DISEASE 08/22/2006   Qualifier: Diagnosis of  By: [name], Denisse Whitenack    . GENITAL HERPES 08/22/2006   Qualifier: Diagnosis of  By: [name], Maher Shon    . GERD 07/28/2006   Qualifier: Diagnosis of  By: [name], Hazyl Marseille    . GERD (gastroesophageal reflux disease)   . HEPATITIS B, CHRONIC 07/28/2006   Qualifier: Diagnosis of  By: [name], Cainan Trull      . HIV DISEASE 07/28/2006   Annotation: dx 2000 Qualifier: Diagnosis of  By: [name], Shanee Batch    . HIV infection ([place])   . Hyperlipidemia   . Hypertension   . HYPERTENSION NEC 07/07/2009   Qualifier: Diagnosis of  By: [name], Daelin Haste    . Kidney stones   . NEPHROLITHIASIS 08/22/2006   Annotation: x2, last 1992 Qualifier: Diagnosis of  By: [name], Angelissa Supan    . Obesity 09/12/2011  . PSORIASIS 08/22/2006   Qualifier: Diagnosis of  By: [name], Ahlaya Ende      Social History   Tobacco Use  . Smoking status: Former Smoker    Packs/day: 0.10    Types: Cigarettes    Last attempt to quit: 07/18/2012    Years since quitting: 5.7  . Smokeless tobacco: Never Used  . Tobacco comment: quit over cruise over the holidays  Substance Use Topics  . Alcohol use: Yes    Alcohol/week: 3.0 - 5.0 standard drinks    Types: 3 - 5 Standard drinks or equivalent per week    Comment: occ  . Drug use: No    Family History  Problem Relation Age of Onset  . Colon cancer Sister   . Heart disease Other   . Hypertension Other   . Arthritis Other   . Cancer Other        colon cancer  . Diabetes Maternal Aunt   . Heart failure Mother     Allergies  Allergen Reactions  . Lisinopril Rash    Health Maintenance  Topic Date Due  . INFLUENZA VACCINE  02/22/2018  . TETANUS/TDAP  05/31/2022  . COLONOSCOPY  04/04/2023  . Hepatitis C Screening  Completed  . HIV Screening  Completed    Objective:  Vitals:   04/11/18 0914  BP: 115/74  Pulse: 66  Temp: 98.1 F (36.7 C)  TempSrc: Oral  Weight: 232 lb (105.2 kg)  Height: 5\' 11"  (1.803 m)   Body mass index is 32.36 kg/m.  Physical Exam  Constitutional: He is oriented to person, place, and time.  He is in good spirits.  HENT:  Mouth/Throat: No oropharyngeal exudate.  Eyes: Conjunctivae are normal.  Cardiovascular: Normal rate and regular rhythm.  No murmur heard. Pulmonary/Chest: Effort normal and breath sounds normal.  Abdominal: Soft.  He exhibits no mass. There is no tenderness.  Musculoskeletal: Normal range of motion.  Neurological: He is alert and oriented to person, place, and time.  Skin: No rash noted.  Psychiatric: He has a normal mood and affect.    Lab Results Lab Results  Component Value Date   WBC 6.8 03/28/2018   HGB 13.5 03/28/2018   HCT 40.0 03/28/2018   MCV 99.0 03/28/2018   PLT 188 03/28/2018    Lab Results  Component Value Date   CREATININE 1.29 03/28/2018  BUN 20 03/28/2018   NA 141 03/28/2018   K 4.5 03/28/2018   CL 102 03/28/2018   CO2 30 03/28/2018    Lab Results  Component Value Date   ALT 26 03/28/2018   AST 27 03/28/2018   ALKPHOS 54 11/15/2017   BILITOT 0.6 03/28/2018    Lab Results  Component Value Date   CHOL 167 03/28/2018   HDL 51 03/28/2018   LDLCALC 88 03/28/2018   TRIG 190 (H) 03/28/2018   CHOLHDL 3.3 03/28/2018   Lab Results  Component Value Date   LABRPR NON-REACTIVE 03/28/2018   HIV 1 RNA Quant (copies/mL)  Date Value  03/28/2018 <20 DETECTED (A)  03/23/2017 <20 DETECTED (A)  12/29/2015 <20   CD4 T Cell Abs (/uL)  Date Value  03/28/2018 790  03/23/2017 560  12/29/2015 380 (L)     Problem List Items Addressed This Visit      High   Chronic hepatitis B virus infection ([place])    I will repeat his hepatitis B blood work before his next visit in 1 year.      Relevant Orders   Hepatitis B DNA, ultraquantitative, PCR   Hepatitis B e antibody   Hepatitis B e antigen   Human immunodeficiency virus (HIV) disease ([place])    Infection remains under excellent, long-term control.  He will continue Genvoya and follow-up after lab work in 1 year.      Relevant Orders   CBC   T-helper cell (CD4)- ([facility] only)   Comprehensive metabolic panel   Lipid panel   RPR   HIV-1 RNA quant-no reflex-bld        Michel Bickers, MD [facility] [phone] pager   [phone] cell 04/11/2018, 9:35  AM

## 2018-05-04 ENCOUNTER — Other Ambulatory Visit: Payer: Self-pay | Admitting: Internal Medicine

## 2018-05-04 DIAGNOSIS — B20 Human immunodeficiency virus [HIV] disease: Secondary | ICD-10-CM

## 2018-05-11 DIAGNOSIS — L400 Psoriasis vulgaris: Secondary | ICD-10-CM | POA: Diagnosis not present

## 2018-06-03 ENCOUNTER — Other Ambulatory Visit: Payer: Self-pay | Admitting: Internal Medicine

## 2018-06-03 DIAGNOSIS — B20 Human immunodeficiency virus [HIV] disease: Secondary | ICD-10-CM

## 2018-06-04 ENCOUNTER — Ambulatory Visit: Payer: Self-pay | Admitting: *Deleted

## 2018-06-04 NOTE — Telephone Encounter (Signed)
2nd attempt to contact pt regarding symptoms but no answer at this time. Left message on voicemail to return call to the office.

## 2018-06-04 NOTE — Telephone Encounter (Signed)
Reason for visit: Request an Appointment    Comments:  Concerned about episode I had the other night. Shortness of breath, dry mouth and feeling faint. All started in middle of night when I woke from terrible leg cramps around inner thigh.      Left message for pt to return call to office to discuss symptoms sent in MyChart message.

## 2018-06-04 NOTE — Telephone Encounter (Signed)
Had a near syncope episode last Friday night. Muscle cramps woke him up and he jumped out of bed. Became sweaty and dizzy. Had extreme shortness of breath . Lasted 10 -15 minutes. Laid on bathroom floor until it passed. Appointment made with his provider for tomorrow. Instructed to go to ED if this happens again. Verbalizes understanding. Reason for Disposition . [1] MILD difficulty breathing (e.g., minimal/no SOB at rest, SOB with walking, pulse <100) AND [2] NEW-onset or WORSE than normal  Answer Assessment - Initial Assessment Questions 1. RESPIRATORY STATUS: "Describe your breathing?" (e.g., wheezing, shortness of breath, unable to speak, severe coughing)      Had an episode of shortness of breath Friday night 2. ONSET: "When did this breathing problem begin?"      Friday night  - leg cramps woke him out of his sleep 3. PATTERN "Does the difficult breathing come and go, or has it been constant since it started?"      No breathing problems now 4. SEVERITY: "How bad is your breathing?" (e.g., mild, moderate, severe)    - MILD: No SOB at rest, mild SOB with walking, speaks normally in sentences, can lay down, no retractions, pulse < 100.    - MODERATE: SOB at rest, SOB with minimal exertion and prefers to sit, cannot lie down flat, speaks in phrases, mild retractions, audible wheezing, pulse 100-120.    - SEVERE: Very SOB at rest, speaks in single words, struggling to breathe, sitting hunched forward, retractions, pulse > 120      During the episode did not have any wheezing 5. RECURRENT SYMPTOM: "Have you had difficulty breathing before?" If so, ask: "When was the last time?" and "What happened that time?"      A long time ago 6. CARDIAC HISTORY: "Do you have any history of heart disease?" (e.g., heart attack, angina, bypass surgery, angioplasty)      No 7. LUNG HISTORY: "Do you have any history of lung disease?"  (e.g., pulmonary embolus, asthma, emphysema)      No 8. CAUSE: "What do you  think is causing the breathing problem?"      Unsure 9. OTHER SYMPTOMS: "Do you have any other symptoms? (e.g., dizziness, runny nose, cough, chest pain, fever)     None 10. PREGNANCY: "Is there any chance you are pregnant?" "When was your last menstrual period?"       n/a 11. TRAVEL: "Have you traveled out of the country in the last month?" (e.g., travel history, exposures)       No  Protocols used: BREATHING DIFFICULTY-A-AH

## 2018-06-05 ENCOUNTER — Encounter: Payer: Self-pay | Admitting: Internal Medicine

## 2018-06-05 ENCOUNTER — Ambulatory Visit: Payer: BLUE CROSS/BLUE SHIELD | Admitting: Internal Medicine

## 2018-06-05 VITALS — BP 132/88 | HR 61 | Temp 98.0°F | Ht 71.0 in | Wt 229.0 lb

## 2018-06-05 DIAGNOSIS — J069 Acute upper respiratory infection, unspecified: Secondary | ICD-10-CM | POA: Diagnosis not present

## 2018-06-05 DIAGNOSIS — R55 Syncope and collapse: Secondary | ICD-10-CM | POA: Diagnosis not present

## 2018-06-05 DIAGNOSIS — I10 Essential (primary) hypertension: Secondary | ICD-10-CM | POA: Diagnosis not present

## 2018-06-05 MED ORDER — AZITHROMYCIN 250 MG PO TABS: ORAL_TABLET | ORAL | 1 refills | Status: DC

## 2018-06-05 NOTE — Assessment & Plan Note (Signed)
Mild to mod, for antibx course,  to f/u any worsening symptoms or concerns 

## 2018-06-05 NOTE — Progress Notes (Signed)
Subjective:    Patient ID: Anthony Steele, male    DOB: 12-10-1960, 57 y.o.   MRN: [id number]  HPI     Here with 2-3 days acute onset fever, facial pain, pressure, headache, general weakness and malaise, and greenish d/c, with mild ST and cough, but pt denies chest pain, wheezing, increased sob or doe, orthopnea, PND, increased LE swelling, palpitations, dizziness or syncope. Did also have an episode x 1 recently with severe post thigh cramping that woke him from sleep, jumped out of bed, experienced nausea without vomiting and marked dizziness, had to crawl in the bathroom as standing made worse.  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  Lasted over 30 min, but none since.  Pt denies chest pain, increased sob or doe, wheezing, orthopnea, PND, increased LE swelling, palpitations, dizziness or syncope.  2017 Echo with normal EF.  Recent labs sept 2019 essentially normal Past Medical History:  Diagnosis Date  . ANXIETY DISORDER, GENERALIZED 08/22/2006   Qualifier: Diagnosis of  By: [name], Joss Mcdill    . Carpal tunnel syndrome, right 05/31/2012  . CERVICAL RADICULOPATHY 08/22/2006   Annotation: L arm 2002 Qualifier: Diagnosis of  By: [name], Koralynn Greenspan    . DEGENERATIVE [place] DISEASE 08/22/2006   Qualifier: Diagnosis of  By: [name], Aleric Froelich    . GENITAL HERPES 08/22/2006   Qualifier: Diagnosis of  By: [name], Deeandra Jerry    . GERD 07/28/2006   Qualifier: Diagnosis of  By: [name], Oyuki Hogan    . GERD (gastroesophageal reflux disease)   . HEPATITIS B, CHRONIC 07/28/2006   Qualifier: Diagnosis of  By: [name], Daiton Cowles    . HIV DISEASE 07/28/2006   Annotation: dx 2000 Qualifier: Diagnosis of  By: [name], Guyla Bless    . HIV infection ([place])   . Hyperlipidemia   . Hypertension   . HYPERTENSION NEC 07/07/2009   Qualifier: Diagnosis of  By: [name], Jemari Hallum    . Kidney stones   . NEPHROLITHIASIS 08/22/2006   Annotation: x2, last 1992 Qualifier: Diagnosis of  By: [name], Kalianna Verbeke    . Obesity 09/12/2011  . PSORIASIS 08/22/2006   Qualifier: Diagnosis of  By: [name], Kloe Oates     Past Surgical History:  Procedure Laterality Date  . COLONOSCOPY    . CYSTECTOMY  2000   cyst removed off buttock cheek    reports that he quit smoking about 5 years ago. His smoking use included cigarettes. He smoked 0.10 packs per day. He has never used smokeless tobacco. He reports that he drinks about 3.0 - 5.0 standard drinks of alcohol per week. He reports that he does not use drugs. family history includes Arthritis in his other; Cancer in his other; Colon cancer in his sister; Diabetes in his maternal aunt; Heart disease in his other; Heart failure in his mother; Hypertension in his other. Allergies  Allergen Reactions  . Lisinopril Rash   Current Outpatient Medications on File Prior to Visit  Medication Sig Dispense Refill  . albuterol (PROVENTIL HFA;VENTOLIN HFA) 108 (90 Base) MCG/ACT inhaler Inhale 2 puffs into the lungs every 6 (six) hours as needed for wheezing or shortness of breath. 1 Inhaler 1  . amLODipine-olmesartan (AZOR) 5-40 MG tablet Take 1 tablet by mouth daily. 90 tablet 3  . aspirin 81 MG tablet Take 81 mg by mouth daily.    Marland Kitchen atorvastatin (LIPITOR) 20 MG  tablet Take 1 tablet (20 mg total) by mouth daily. 90 tablet 3  . diclofenac (VOLTAREN) 50 MG EC tablet TAKE 1 TABLET TWICE A DAY AS NEEDED FOR INFLAMMATION 60 tablet 3  . diclofenac sodium (VOLTAREN) 1 % GEL Apply 4 g topically 4 (four) times daily as needed. 400 g 3  . GENVOYA 150-150-200-10 MG TABS tablet TAKE 1 TABLET BY MOUTH EVERY DAY WITH BREAKFAST 30 tablet PRN  . pantoprazole (PROTONIX) 40 MG tablet Take 1 tablet (40 mg total) by mouth daily. 90 tablet 3  . valACYclovir (VALTREX) 500 MG tablet TAKE 1 TABLET (500 MG TOTAL) BY MOUTH 2 (TWO) TIMES DAILY. 180 tablet 0   No current facility-administered medications on file prior to visit.     Review of Systems  Constitutional: Negative for other unusual diaphoresis or sweats HENT: Negative for ear discharge or swelling Eyes: Negative for other worsening visual disturbances Respiratory: Negative for stridor or other swelling  Gastrointestinal: Negative for worsening distension or other blood Genitourinary: Negative for retention or other urinary change Musculoskeletal: Negative for other MSK pain or swelling Skin: Negative for color change or other new lesions Neurological: Negative for worsening tremors and other numbness  Psychiatric/Behavioral: Negative for worsening agitation or other fatigue All other system neg per pt    Objective:   Physical Exam BP 132/88   Pulse 61   Temp 98 F (36.7 C) (Oral)   Ht 5\' 11"  (1.803 m)   Wt 229 lb (103.9 kg)   SpO2 97%   BMI 31.94 kg/m  VS noted, mild ill Constitutional: Pt appears in NAD HENT: Head: NCAT.  Right Ear: External ear normal.  Left Ear: External ear normal.  Eyes: . Pupils are equal, round, and reactive to light. Conjunctivae and EOM are normal Bilat tm's with mild erythema.  Max sinus areas mild tender.  Pharynx with mild erythema, no exudate Nose: without d/c or deformity Neck: Neck supple. Gross normal ROM Cardiovascular: Normal rate and regular rhythm.   Pulmonary/Chest: Effort normal and breath sounds without rales or wheezing.  Abd:  Soft, NT, ND, + BS, no organomegaly Neurological: Pt is alert. At baseline orientation, motor grossly intact Skin: Skin is warm. No rashes, other new lesions, no LE edema Psychiatric: Pt behavior is normal without agitation  No other exam findings Lab Results  Component Value Date   WBC 6.8 03/28/2018   HGB 13.5 03/28/2018   HCT 40.0 03/28/2018   PLT 188 03/28/2018   GLUCOSE 106 (H) 03/28/2018   CHOL 167 03/28/2018   TRIG 190 (H) 03/28/2018   HDL 51 03/28/2018   LDLCALC 88 03/28/2018   ALT 26 03/28/2018   AST 27 03/28/2018   NA 141 03/28/2018   K 4.5 03/28/2018     CL 102 03/28/2018   CREATININE 1.29 03/28/2018   BUN 20 03/28/2018   CO2 30 03/28/2018   TSH 3.82 11/15/2017   PSA 0.19 11/15/2017   HGBA1C 5.5 11/15/2017       Assessment & Plan:

## 2018-06-05 NOTE — Assessment & Plan Note (Signed)
D/w pt, likely vasovagal due to severe acute post thigh pain crampy like with nausea and dizzy but no syncope or falls or injury.  Likely due to overexertion, exam o/w benign, ok to follow

## 2018-06-05 NOTE — Assessment & Plan Note (Signed)
stable overall by history and exam, recent data reviewed with pt, and pt to continue medical treatment as before,  to f/u any worsening symptoms or concerns  

## 2018-06-05 NOTE — Patient Instructions (Signed)
Please take all new medication as prescribed - the antibiotic  Please continue all other medications as before, and refills have been done if requested.  Please have the pharmacy call with any other refills you may need.  Please continue your efforts at being more active, low cholesterol diet, and weight control.  Please keep your appointments with your specialists as you may have planned    

## 2018-10-04 ENCOUNTER — Other Ambulatory Visit: Payer: Self-pay | Admitting: Internal Medicine

## 2018-10-09 ENCOUNTER — Other Ambulatory Visit: Payer: Self-pay | Admitting: Internal Medicine

## 2018-11-08 ENCOUNTER — Other Ambulatory Visit: Payer: Self-pay | Admitting: Internal Medicine

## 2018-11-21 ENCOUNTER — Encounter (INDEPENDENT_AMBULATORY_CARE_PROVIDER_SITE_OTHER): Payer: Self-pay | Admitting: Family Medicine

## 2018-11-21 ENCOUNTER — Ambulatory Visit (INDEPENDENT_AMBULATORY_CARE_PROVIDER_SITE_OTHER): Payer: BLUE CROSS/BLUE SHIELD | Admitting: Family Medicine

## 2018-11-21 ENCOUNTER — Other Ambulatory Visit: Payer: Self-pay

## 2018-11-21 DIAGNOSIS — M79661 Pain in right lower leg: Secondary | ICD-10-CM

## 2018-11-21 NOTE — Progress Notes (Signed)
Office Visit Note   Patient: Anthony Steele           Date of Birth: 1960-08-21           MRN: [id number] Visit Date: 11/21/2018 Requested by: Biagio Borg, MD [place] [place], [place] [address] PCP: Biagio Borg, MD  Subjective: Chief Complaint  Patient presents with  . Right Lower Leg - Pain    Heard a pop in the right calf this morning while stretching in bed, before getting up. Pain with weightbearing. Been icing and elevating leg this morning.    HPI: He is here with right lower leg pain.  Yesterday he helped his brother move some heavy furniture down some stairs.  It was awkward, but he does not recall any pain.  This morning while lying in bed, he stretch before getting out of bed and felt and heard a loud pop in his lower leg.  He has been using ice on it since then, hurts to bear weight.  No previous problems with his leg.  Pain is on the medial side of his calf.              ROS: He has a history of HIV, undetectable.  He has hypertension which is well controlled.  All other systems were reviewed and are negative.  Objective: Vital Signs: There were no vitals taken for this visit.  Physical Exam:  General:  Alert and oriented, in no acute distress. Pulm:  Breathing unlabored. Psy:  Normal mood, congruent affect. Skin: No rash on his skin, no erythema. Right leg: There is pitting edema from the medial calf down to the ankle.  He is exquisitely tender to palpation of the medial head gastrocnemius.  No tenderness over the Achilles, no palpable defect in the Achilles.  He is able to plantarflex against resistance but with pain.  Imaging: Musculoskeletal ultrasound: Achilles tendon is intact.  There is a medial head gastrocnemius muscle tear from the fascia separating the soleus.  The width of the tear is about 1 cm, it is not significantly retracted.  Assessment & Plan: 1.  Right medial head gastrocnemius strain/muscle tear. -Calf sleeve for comfort, physical  therapy referral.  Weightbearing as tolerated.  Anticipate about 6 weeks total healing time.  Follow-up as needed.     Procedures: No procedures performed  No notes on file     PMFS History: Patient Active Problem List   Diagnosis Date Noted  . Vasovagal near syncope 06/05/2018  . Acute upper respiratory infection 06/05/2018  . Family history of colon cancer 11/15/2017  . Chest pain 11/15/2017  . Abnormal TSH 11/15/2017  . Hyperglycemia 11/09/2016  . Cough 01/08/2016  . Wheezing 01/08/2016  . Peripheral edema 10/02/2015  . Pain of right heel 06/18/2013  . Encounter for well adult exam with abnormal findings 05/31/2012  . Hyperlipidemia 05/31/2012  . Carpal tunnel syndrome, right 05/31/2012  . Obesity 09/12/2011  . Dizziness 04/19/2011  . LOW BACK PAIN, ACUTE 05/27/2010  . Essential hypertension 07/07/2009  . CIGARETTE SMOKER 05/08/2007  . GENITAL HERPES 08/22/2006  . VENEREAL WART 08/22/2006  . ANXIETY DISORDER, GENERALIZED 08/22/2006  . NEPHROLITHIASIS 08/22/2006  . PSORIASIS 08/22/2006  . DEGENERATIVE DISC DISEASE 08/22/2006  . CERVICAL RADICULOPATHY 08/22/2006  . Human immunodeficiency virus (HIV) disease ([place]) 07/28/2006  . Chronic hepatitis B virus infection ([place]) 07/28/2006  . GERD 07/28/2006  . PNEUMONIA 01/22/2006   Past Medical History:  Diagnosis Date  . ANXIETY  DISORDER, GENERALIZED 08/22/2006   Qualifier: Diagnosis of  By: [name], [name]    . Carpal tunnel syndrome, right 05/31/2012  . CERVICAL RADICULOPATHY 08/22/2006   Annotation: L arm 2002 Qualifier: Diagnosis of  By: [name], [name]    . DEGENERATIVE [place] DISEASE 08/22/2006   Qualifier: Diagnosis of  By: [name], [name]    . GENITAL HERPES 08/22/2006   Qualifier: Diagnosis of  By: [name], [name]    . GERD 07/28/2006   Qualifier: Diagnosis of  By: [name], [name]    . GERD (gastroesophageal reflux disease)   . HEPATITIS B, CHRONIC 07/28/2006   Qualifier: Diagnosis of  By: [name], [name]     . HIV DISEASE 07/28/2006   Annotation: dx 2000 Qualifier: Diagnosis of  By: [name], [name]    . HIV infection ([place])   . Hyperlipidemia   . Hypertension   . HYPERTENSION NEC 07/07/2009   Qualifier: Diagnosis of  By: [name], [name]    . Kidney stones   . NEPHROLITHIASIS 08/22/2006   Annotation: x2, last 1992 Qualifier: Diagnosis of  By: [name], [name]    . Obesity 09/12/2011  . PSORIASIS 08/22/2006   Qualifier: Diagnosis of  By: [name], [name]      Family History  Problem Relation Age of Onset  . Colon cancer Sister   . Heart disease Other   . Hypertension Other   . Arthritis Other   . Cancer Other        colon cancer  . Diabetes Maternal Aunt   . Heart failure Mother     Past Surgical History:  Procedure Laterality Date  . COLONOSCOPY    . CYSTECTOMY  2000   cyst removed off buttock cheek   Social History   Occupational History  . Not on file  Tobacco Use  . Smoking status: Former Smoker    Packs/day: 0.10    Types: Cigarettes    Last attempt to quit: 07/18/2012    Years since quitting: 6.3  . Smokeless tobacco: Never Used  . Tobacco comment: quit over cruise over the holidays  Substance and Sexual Activity  . Alcohol use: Yes    Alcohol/week: 3.0 - 5.0 standard drinks    Types: 3 - 5 Standard drinks or equivalent per week    Comment: occ  . Drug use: No  . Sexual activity: Not Currently    Comment: decline condoms

## 2018-11-28 DIAGNOSIS — M79661 Pain in right lower leg: Secondary | ICD-10-CM | POA: Diagnosis not present

## 2018-11-30 DIAGNOSIS — M79661 Pain in right lower leg: Secondary | ICD-10-CM | POA: Diagnosis not present

## 2018-12-03 DIAGNOSIS — M79661 Pain in right lower leg: Secondary | ICD-10-CM | POA: Diagnosis not present

## 2018-12-06 DIAGNOSIS — M79661 Pain in right lower leg: Secondary | ICD-10-CM | POA: Diagnosis not present

## 2018-12-11 DIAGNOSIS — M79661 Pain in right lower leg: Secondary | ICD-10-CM | POA: Diagnosis not present

## 2018-12-12 ENCOUNTER — Other Ambulatory Visit: Payer: Self-pay | Admitting: Internal Medicine

## 2018-12-13 DIAGNOSIS — M79661 Pain in right lower leg: Secondary | ICD-10-CM | POA: Diagnosis not present

## 2018-12-14 ENCOUNTER — Other Ambulatory Visit: Payer: Self-pay | Admitting: Internal Medicine

## 2018-12-14 DIAGNOSIS — A6000 Herpesviral infection of urogenital system, unspecified: Secondary | ICD-10-CM

## 2018-12-19 DIAGNOSIS — M79661 Pain in right lower leg: Secondary | ICD-10-CM | POA: Diagnosis not present

## 2018-12-21 DIAGNOSIS — M79661 Pain in right lower leg: Secondary | ICD-10-CM | POA: Diagnosis not present

## 2018-12-25 DIAGNOSIS — M79661 Pain in right lower leg: Secondary | ICD-10-CM | POA: Diagnosis not present

## 2019-01-30 ENCOUNTER — Other Ambulatory Visit: Payer: Self-pay | Admitting: Internal Medicine

## 2019-02-01 ENCOUNTER — Other Ambulatory Visit: Payer: Self-pay | Admitting: Internal Medicine

## 2019-02-09 ENCOUNTER — Other Ambulatory Visit: Payer: Self-pay | Admitting: Internal Medicine

## 2019-02-10 ENCOUNTER — Other Ambulatory Visit: Payer: Self-pay | Admitting: Internal Medicine

## 2019-02-11 ENCOUNTER — Encounter: Payer: Self-pay | Admitting: Internal Medicine

## 2019-02-12 MED ORDER — ATORVASTATIN CALCIUM 20 MG PO TABS: 20.0000 mg | ORAL_TABLET | Freq: Every day | ORAL | 0 refills | Status: DC

## 2019-02-13 IMAGING — DX DG CHEST 2V
2 series · 2 of 2 positions shown · non-contrast
Comparison: None available

CLINICAL DATA: Unspecified chest pain

EXAM:
CHEST - 2 VIEW

[chest pa]
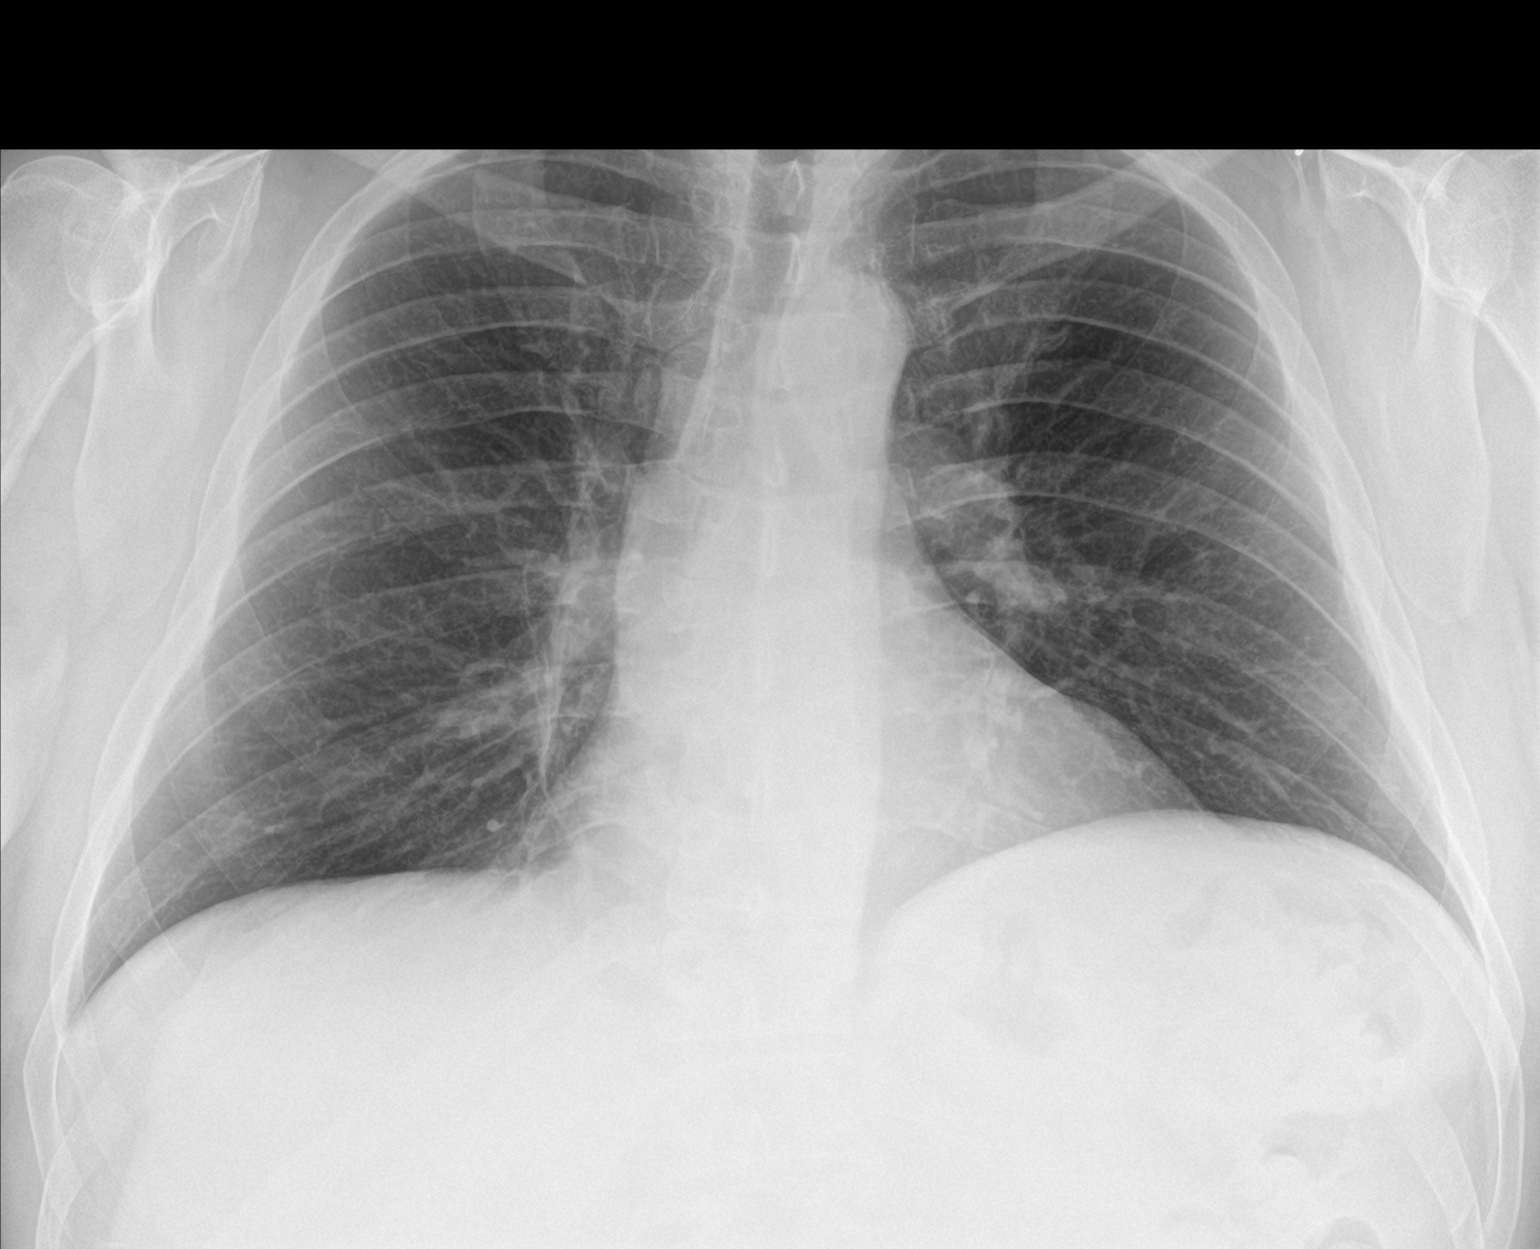

[chest lat]
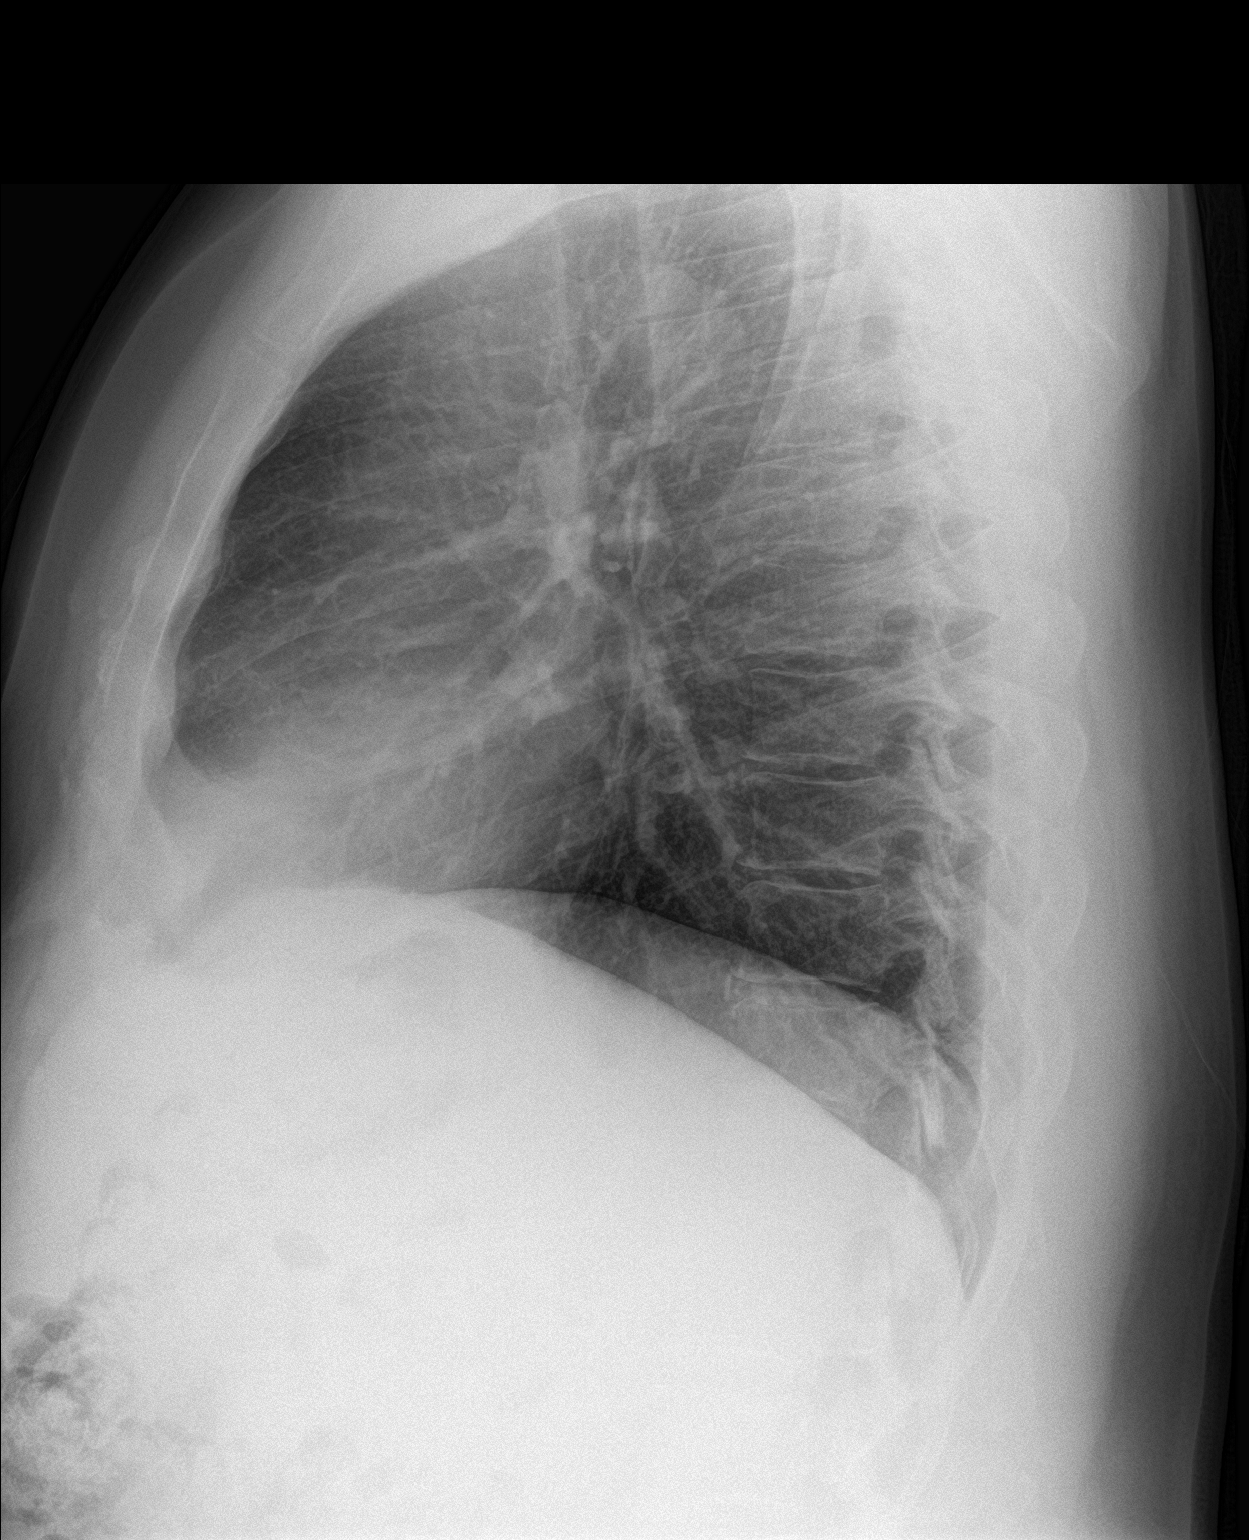

[2 of 2 positions shown; findings below may reference images not displayed]

FINDINGS: Mild linear density below the right hilum, atelectasis versus
scarring based on morphology. Normal heart size and aortic contours.
There is no edema, consolidation, effusion, or pneumothorax. No
osseous findings.
IMPRESSION: 1. No specific explanation for chest pain.
2. Mild linear opacity on the right favoring scar or atelectasis.

## 2019-02-15 ENCOUNTER — Encounter: Payer: BLUE CROSS/BLUE SHIELD | Admitting: Internal Medicine

## 2019-02-19 ENCOUNTER — Other Ambulatory Visit: Payer: BC Managed Care – PPO

## 2019-02-19 ENCOUNTER — Encounter: Payer: Self-pay | Admitting: Internal Medicine

## 2019-02-19 ENCOUNTER — Ambulatory Visit (INDEPENDENT_AMBULATORY_CARE_PROVIDER_SITE_OTHER): Payer: BC Managed Care – PPO | Admitting: Internal Medicine

## 2019-02-19 ENCOUNTER — Other Ambulatory Visit (INDEPENDENT_AMBULATORY_CARE_PROVIDER_SITE_OTHER): Payer: BC Managed Care – PPO

## 2019-02-19 ENCOUNTER — Other Ambulatory Visit: Payer: Self-pay

## 2019-02-19 VITALS — BP 118/70 | HR 73 | Temp 98.6°F | Wt 240.1 lb

## 2019-02-19 DIAGNOSIS — R739 Hyperglycemia, unspecified: Secondary | ICD-10-CM

## 2019-02-19 DIAGNOSIS — E611 Iron deficiency: Secondary | ICD-10-CM | POA: Diagnosis not present

## 2019-02-19 DIAGNOSIS — A6000 Herpesviral infection of urogenital system, unspecified: Secondary | ICD-10-CM | POA: Diagnosis not present

## 2019-02-19 DIAGNOSIS — E559 Vitamin D deficiency, unspecified: Secondary | ICD-10-CM | POA: Diagnosis not present

## 2019-02-19 DIAGNOSIS — Z0000 Encounter for general adult medical examination without abnormal findings: Secondary | ICD-10-CM | POA: Diagnosis not present

## 2019-02-19 DIAGNOSIS — Z125 Encounter for screening for malignant neoplasm of prostate: Secondary | ICD-10-CM

## 2019-02-19 DIAGNOSIS — Z23 Encounter for immunization: Secondary | ICD-10-CM

## 2019-02-19 DIAGNOSIS — E538 Deficiency of other specified B group vitamins: Secondary | ICD-10-CM

## 2019-02-19 LAB — URINALYSIS, ROUTINE W REFLEX MICROSCOPIC
Bilirubin Urine: NEGATIVE
Hgb urine dipstick: NEGATIVE
Ketones, ur: NEGATIVE
Leukocytes,Ua: NEGATIVE
Nitrite: NEGATIVE
RBC / HPF: NONE SEEN (ref 0–?)
Specific Gravity, Urine: 1.025 (ref 1.000–1.030)
Total Protein, Urine: NEGATIVE
Urine Glucose: NEGATIVE
Urobilinogen, UA: 0.2 (ref 0.0–1.0)
WBC, UA: NONE SEEN (ref 0–?)
pH: 5.5 (ref 5.0–8.0)

## 2019-02-19 LAB — CBC WITH DIFFERENTIAL/PLATELET
Basophils Absolute: 0.1 10*3/uL (ref 0.0–0.1)
Basophils Relative: 1.1 % (ref 0.0–3.0)
Eosinophils Absolute: 0.1 10*3/uL (ref 0.0–0.7)
Eosinophils Relative: 1.5 % (ref 0.0–5.0)
HCT: 39.5 % (ref 39.0–52.0)
Hemoglobin: 13.3 g/dL (ref 13.0–17.0)
Lymphocytes Relative: 35.7 % (ref 12.0–46.0)
Lymphs Abs: 2.4 10*3/uL (ref 0.7–4.0)
MCHC: 33.8 g/dL (ref 30.0–36.0)
MCV: 100.3 fl — ABNORMAL HIGH (ref 78.0–100.0)
Monocytes Absolute: 0.5 10*3/uL (ref 0.1–1.0)
Monocytes Relative: 8 % (ref 3.0–12.0)
Neutro Abs: 3.7 10*3/uL (ref 1.4–7.7)
Neutrophils Relative %: 53.7 % (ref 43.0–77.0)
Platelets: 201 10*3/uL (ref 150.0–400.0)
RBC: 3.94 Mil/uL — ABNORMAL LOW (ref 4.22–5.81)
RDW: 12.8 % (ref 11.5–15.5)
WBC: 6.8 10*3/uL (ref 4.0–10.5)

## 2019-02-19 LAB — VITAMIN D 25 HYDROXY (VIT D DEFICIENCY, FRACTURES): VITD: 35.25 ng/mL (ref 30.00–100.00)

## 2019-02-19 LAB — TSH: TSH: 4.25 u[IU]/mL (ref 0.35–4.50)

## 2019-02-19 LAB — BASIC METABOLIC PANEL
BUN: 23 mg/dL (ref 6–23)
CO2: 27 mEq/L (ref 19–32)
Calcium: 9.8 mg/dL (ref 8.4–10.5)
Chloride: 105 mEq/L (ref 96–112)
Creatinine, Ser: 1.26 mg/dL (ref 0.40–1.50)
GFR: 59.03 mL/min — ABNORMAL LOW (ref >60.00)
Glucose, Bld: 122 mg/dL — ABNORMAL HIGH (ref 70–99)
Potassium: 4.3 mEq/L (ref 3.5–5.1)
Sodium: 139 mEq/L (ref 135–145)

## 2019-02-19 LAB — LIPID PANEL
Cholesterol: 183 mg/dL (ref 0–200)
HDL: 46.6 mg/dL (ref >39.00)
LDL Cholesterol: 104 mg/dL — ABNORMAL HIGH (ref 0–99)
NonHDL: 136.31
Total CHOL/HDL Ratio: 4
Triglycerides: 160 mg/dL — ABNORMAL HIGH (ref 0.0–149.0)
VLDL: 32 mg/dL (ref 0.0–40.0)

## 2019-02-19 LAB — IBC PANEL
Iron: 82 ug/dL (ref 42–165)
Saturation Ratios: 19.5 % — ABNORMAL LOW (ref 20.0–50.0)
Transferrin: 301 mg/dL (ref 212.0–360.0)

## 2019-02-19 LAB — PSA: PSA: 0.19 ng/mL (ref 0.10–4.00)

## 2019-02-19 LAB — HEPATIC FUNCTION PANEL
ALT: 28 U/L (ref 0–53)
AST: 31 U/L (ref 0–37)
Albumin: 4.9 g/dL (ref 3.5–5.2)
Alkaline Phosphatase: 57 U/L (ref 39–117)
Bilirubin, Direct: 0.1 mg/dL (ref 0.0–0.3)
Total Bilirubin: 0.6 mg/dL (ref 0.2–1.2)
Total Protein: 7.2 g/dL (ref 6.0–8.3)

## 2019-02-19 LAB — HEMOGLOBIN A1C: Hgb A1c MFr Bld: 5.6 % (ref 4.6–6.5)

## 2019-02-19 LAB — VITAMIN B12: Vitamin B-12: 291 pg/mL (ref 211–911)

## 2019-02-19 MED ORDER — PANTOPRAZOLE SODIUM 40 MG PO TBEC: 40.0000 mg | DELAYED_RELEASE_TABLET | Freq: Every day | ORAL | 3 refills | Status: DC

## 2019-02-19 MED ORDER — VALACYCLOVIR HCL 500 MG PO TABS: 500.0000 mg | ORAL_TABLET | Freq: Two times a day (BID) | ORAL | 3 refills | Status: DC

## 2019-02-19 MED ORDER — DICLOFENAC SODIUM 50 MG PO TBEC: DELAYED_RELEASE_TABLET | ORAL | 3 refills | Status: DC

## 2019-02-19 MED ORDER — AMLODIPINE-OLMESARTAN 5-40 MG PO TABS: 1.0000 | ORAL_TABLET | Freq: Every day | ORAL | 3 refills | Status: DC

## 2019-02-19 MED ORDER — ALBUTEROL SULFATE HFA 108 (90 BASE) MCG/ACT IN AERS: INHALATION_SPRAY | RESPIRATORY_TRACT | 11 refills | Status: DC

## 2019-02-19 MED ORDER — DICLOFENAC SODIUM 1 % TD GEL: 4.0000 g | Freq: Four times a day (QID) | TRANSDERMAL | 3 refills | Status: DC | PRN

## 2019-02-19 MED ORDER — ATORVASTATIN CALCIUM 20 MG PO TABS: 20.0000 mg | ORAL_TABLET | Freq: Every day | ORAL | 3 refills | Status: DC

## 2019-02-19 NOTE — Assessment & Plan Note (Signed)
stable overall by history and exam, recent data reviewed with pt, and pt to continue medical treatment as before,  to f/u any worsening symptoms or concerns  

## 2019-02-19 NOTE — Assessment & Plan Note (Signed)

## 2019-02-19 NOTE — Progress Notes (Signed)
Subjective:    Patient ID: Anthony Steele, male    DOB: 27-Dec-1960, 58 y.o.   MRN: [id number]  HPI  Here for wellness and f/u;  Overall doing ok;  Pt denies Chest pain, worsening SOB, DOE, wheezing, orthopnea, PND, worsening LE edema, palpitations, dizziness or syncope.  Pt denies neurological change such as new headache, facial or extremity weakness.  Pt denies polydipsia, polyuria, or low sugar symptoms. Pt states overall good compliance with treatment and medications, good tolerability, and has been trying to follow appropriate diet.  Pt denies worsening depressive symptoms, suicidal ideation or panic. No fever, night sweats, wt loss, loss of appetite, or other constitutional symptoms.  Pt states good ability with ADL's, has low fall risk, home safety reviewed and adequate, no other significant changes in hearing or vision, and only occasionally active with exercise, plans to start walking more now that calf muscle tear is resolved. Wt Readings from Last 3 Encounters:  02/19/19 240 lb 1.9 oz (108.9 kg)  06/05/18 229 lb (103.9 kg)  04/11/18 232 lb (105.2 kg)   Past Medical History:  Diagnosis Date  . ANXIETY DISORDER, GENERALIZED 08/22/2006   Qualifier: Diagnosis of  By: [name], Karey Suthers    . Carpal tunnel syndrome, right 05/31/2012  . CERVICAL RADICULOPATHY 08/22/2006   Annotation: L arm 2002 Qualifier: Diagnosis of  By: [name], Allexa Acoff    . DEGENERATIVE [place] DISEASE 08/22/2006   Qualifier: Diagnosis of  By: [name], Anh Mangano    . GENITAL HERPES 08/22/2006   Qualifier: Diagnosis of  By: [name], Amjad Fikes    . GERD 07/28/2006   Qualifier: Diagnosis of  By: [name], Dorrien Grunder    . GERD (gastroesophageal reflux disease)   . HEPATITIS B, CHRONIC 07/28/2006   Qualifier: Diagnosis of  By: [name], Lilo Wallington    . HIV DISEASE 07/28/2006   Annotation: dx 2000 Qualifier: Diagnosis of  By: [name], Glorine Hanratty    . HIV infection ([place])   . Hyperlipidemia   . Hypertension   . HYPERTENSION NEC 07/07/2009   Qualifier: Diagnosis of  By: [name], Elainah Rhyne    . Kidney stones   . NEPHROLITHIASIS 08/22/2006   Annotation: x2, last 1992 Qualifier: Diagnosis of  By: [name], Iman Orourke    . Obesity 09/12/2011  . PSORIASIS 08/22/2006   Qualifier: Diagnosis of  By: [name], Samad Thon     Past Surgical History:  Procedure Laterality Date  . COLONOSCOPY    . CYSTECTOMY  2000   cyst removed off buttock cheek    reports that he quit smoking about 6 years ago. His smoking use included cigarettes. He smoked 0.10 packs per day. He has never used smokeless tobacco. He reports current alcohol use of about 3.0 - 5.0 standard drinks of alcohol per week. He reports that he does not use drugs. family history includes Arthritis in an other family member; Cancer in an other family member; Colon cancer in his sister; Diabetes in his maternal aunt; Heart disease in an other family member; Heart failure in his mother; Hypertension in an other family member. Allergies  Allergen Reactions  . Lisinopril Rash   Current Outpatient Medications on File Prior to Visit  Medication Sig Dispense Refill  . albuterol (VENTOLIN HFA) 108 (90 Base) MCG/ACT inhaler TAKE 2 PUFFS BY MOUTH EVERY 6 HOURS AS NEEDED FOR WHEEZE OR SHORTNESS OF BREATH 8.5 Inhaler 1  . amLODipine-olmesartan (AZOR) 5-40 MG tablet TAKE 1 TABLET BY MOUTH EVERY DAY 90 tablet 1  .  aspirin 81 MG tablet Take 81 mg by mouth daily.    Marland Kitchen atorvastatin (LIPITOR) 20 MG tablet Take 1 tablet (20 mg total) by mouth daily. APPOINTMENT NEEDED FOR ADDITIONAL REFILLS 30 tablet 0  . augmented betamethasone dipropionate (DIPROLENE-AF) 0.05 % cream APPLY TO AFFECTED AREA UP TO TWICE A DAY AS NEEDED( NOT TO FACE,GROIN OR UNDERASRMS )    . diclofenac (VOLTAREN) 50 MG EC tablet TAKE 1 TABLET TWICE A DAY AS NEEDED FOR INFLAMMATION 180 tablet 1  . diclofenac sodium (VOLTAREN) 1 % GEL Apply 4 g topically 4 (four) times daily as needed. 400 g 3  . GENVOYA 150-150-200-10 MG TABS tablet TAKE 1  TABLET BY MOUTH EVERY DAY WITH BREAKFAST 30 tablet PRN  . pantoprazole (PROTONIX) 40 MG tablet TAKE 1 TABLET BY MOUTH EVERY DAY 90 tablet 0  . valACYclovir (VALTREX) 500 MG tablet TAKE 1 TABLET BY MOUTH TWICE A DAY 180 tablet 1   No current facility-administered medications on file prior to visit.    Review of Systems Constitutional: Negative for other unusual diaphoresis, sweats, appetite or weight changes HENT: Negative for other worsening hearing loss, ear pain, facial swelling, mouth sores or neck stiffness.   Eyes: Negative for other worsening pain, redness or other visual disturbance.  Respiratory: Negative for other stridor or swelling Cardiovascular: Negative for other palpitations or other chest pain  Gastrointestinal: Negative for worsening diarrhea or loose stools, blood in stool, distention or other pain Genitourinary: Negative for hematuria, flank pain or other change in urine volume.  Musculoskeletal: Negative for myalgias or other joint swelling.  Skin: Negative for other color change, or other wound or worsening drainage.  Neurological: Negative for other syncope or numbness. Hematological: Negative for other adenopathy or swelling Psychiatric/Behavioral: Negative for hallucinations, other worsening agitation, SI, self-injury, or new decreased concentration All other system neg per pt    Objective:   Physical Exam BP 118/70 (BP Location: Left Arm)   Pulse 73   Temp 98.6 F (37 C) (Oral)   Wt 240 lb 1.9 oz (108.9 kg)   SpO2 97%   BMI 33.49 kg/m  VS noted,  Constitutional: Pt is oriented to person, place, and time. Appears well-developed and well-nourished, in no significant distress and comfortable Head: Normocephalic and atraumatic  Eyes: Conjunctivae and EOM are normal. Pupils are equal, round, and reactive to light Right Ear: External ear normal without discharge Left Ear: External ear normal without discharge Nose: Nose without discharge or deformity  Mouth/Throat: Oropharynx is without other ulcerations and moist  Neck: Normal range of motion. Neck supple. No JVD present. No tracheal deviation present or significant neck LA or mass Cardiovascular: Normal rate, regular rhythm, normal heart sounds and intact distal pulses.   Pulmonary/Chest: WOB normal and breath sounds without rales or wheezing  Abdominal: Soft. Bowel sounds are normal. NT. No HSM  Musculoskeletal: Normal range of motion. Exhibits no edema Lymphadenopathy: Has no other cervical adenopathy.  Neurological: Pt is alert and oriented to person, place, and time. Pt has normal reflexes. No cranial nerve deficit. Motor grossly intact, Gait intact Skin: Skin is warm and dry. No rash noted or new ulcerations Psychiatric:  Has normal mood and affect. Behavior is normal without agitation No other exam findings Lab Results  Component Value Date   WBC 6.8 03/28/2018   HGB 13.5 03/28/2018   HCT 40.0 03/28/2018   PLT 188 03/28/2018   GLUCOSE 106 (H) 03/28/2018   CHOL 167 03/28/2018   TRIG 190 (H) 03/28/2018  HDL 51 03/28/2018   LDLCALC 88 03/28/2018   ALT 26 03/28/2018   AST 27 03/28/2018   NA 141 03/28/2018   K 4.5 03/28/2018   CL 102 03/28/2018   CREATININE 1.29 03/28/2018   BUN 20 03/28/2018   CO2 30 03/28/2018   TSH 3.82 11/15/2017   PSA 0.19 11/15/2017   HGBA1C 5.5 11/15/2017          Assessment & Plan:

## 2019-02-19 NOTE — Patient Instructions (Addendum)
You had the Tdap tetanus shot todayu  Please continue all other medications as before, and refills have been done if requested.  Please have the pharmacy call with any other refills you may need.  Please continue your efforts at being more active, low cholesterol diet, and weight control.  You are otherwise up to date with prevention measures today.  Please keep your appointments with your specialists as you may have planned  Please go to the LAB in the Basement (turn left off the elevator) for the tests to be done today  You will be contacted by phone if any changes need to be made immediately.  Otherwise, you will receive a letter about your results with an explanation, but please check with MyChart first.  Please remember to sign up for MyChart if you have not done so, as this will be important to you in the future with finding out test results, communicating by private email, and scheduling acute appointments online when needed.  Please return in 1 year for your yearly visit, or sooner if needed, with Lab testing done 3-5 days before

## 2019-04-11 ENCOUNTER — Other Ambulatory Visit: Payer: Self-pay

## 2019-04-11 ENCOUNTER — Other Ambulatory Visit: Payer: BC Managed Care – PPO

## 2019-04-11 DIAGNOSIS — Z0000 Encounter for general adult medical examination without abnormal findings: Secondary | ICD-10-CM

## 2019-04-11 DIAGNOSIS — B181 Chronic viral hepatitis B without delta-agent: Secondary | ICD-10-CM | POA: Diagnosis not present

## 2019-04-11 DIAGNOSIS — B20 Human immunodeficiency virus [HIV] disease: Secondary | ICD-10-CM

## 2019-04-13 LAB — HELPER T-LYMPH-CD4 (ARMC ONLY)
% CD 4 Pos. Lymph.: 27 % — ABNORMAL LOW (ref 30.8–58.5)
Absolute CD 4 Helper: 621 /uL (ref 359–1519)
Basophils Absolute: 0 10*3/uL (ref 0.0–0.2)
Basos: 1 %
EOS (ABSOLUTE): 0.1 10*3/uL (ref 0.0–0.4)
Eos: 2 %
Hematocrit: 43.7 % (ref 37.5–51.0)
Hemoglobin: 13.8 g/dL (ref 13.0–17.7)
Immature Grans (Abs): 0 10*3/uL (ref 0.0–0.1)
Immature Granulocytes: 0 %
Lymphocytes Absolute: 2.3 10*3/uL (ref 0.7–3.1)
Lymphs: 42 %
MCH: 33.3 pg — ABNORMAL HIGH (ref 26.6–33.0)
MCHC: 31.6 g/dL (ref 31.5–35.7)
MCV: 105 fL — ABNORMAL HIGH (ref 79–97)
Monocytes Absolute: 0.5 10*3/uL (ref 0.1–0.9)
Monocytes: 10 %
Neutrophils Absolute: 2.5 10*3/uL (ref 1.4–7.0)
Neutrophils: 45 %
Platelets: 187 10*3/uL (ref 150–450)
RBC: 4.15 x10E6/uL (ref 4.14–5.80)
RDW: 12.6 % (ref 11.6–15.4)
WBC: 5.5 10*3/uL (ref 3.4–10.8)

## 2019-04-16 LAB — COMPREHENSIVE METABOLIC PANEL
AG Ratio: 2 (calc) (ref 1.0–2.5)
ALT: 33 U/L (ref 9–46)
AST: 28 U/L (ref 10–35)
Albumin: 5 g/dL (ref 3.6–5.1)
Alkaline phosphatase (APISO): 55 U/L (ref 35–144)
BUN/Creatinine Ratio: 26 (calc) — ABNORMAL HIGH (ref 6–22)
BUN: 29 mg/dL — ABNORMAL HIGH (ref 7–25)
CO2: 28 mmol/L (ref 20–32)
Calcium: 10 mg/dL (ref 8.6–10.3)
Chloride: 106 mmol/L (ref 98–110)
Creat: 1.13 mg/dL (ref 0.70–1.33)
Globulin: 2.5 g/dL (calc) (ref 1.9–3.7)
Glucose, Bld: 114 mg/dL — ABNORMAL HIGH (ref 65–99)
Potassium: 5.1 mmol/L (ref 3.5–5.3)
Sodium: 140 mmol/L (ref 135–146)
Total Bilirubin: 0.7 mg/dL (ref 0.2–1.2)
Total Protein: 7.5 g/dL (ref 6.1–8.1)

## 2019-04-16 LAB — LIPID PANEL
Cholesterol: 194 mg/dL (ref <200)
HDL: 50 mg/dL (ref >=40)
LDL Cholesterol (Calc): 125 mg/dL (calc) — ABNORMAL HIGH
Non-HDL Cholesterol (Calc): 144 mg/dL (calc) — ABNORMAL HIGH (ref <130)
Total CHOL/HDL Ratio: 3.9 (calc) (ref <5.0)
Triglycerides: 89 mg/dL (ref <150)

## 2019-04-16 LAB — HEPATITIS B DNA, ULTRAQUANTITATIVE, PCR
Hepatitis B DNA (Calc): <1 Log IU/mL — ABNORMAL HIGH
Hepatitis B DNA: <10 IU/mL — ABNORMAL HIGH

## 2019-04-16 LAB — RPR: RPR Ser Ql: NONREACTIVE

## 2019-04-16 LAB — HEPATITIS B E ANTIBODY: Hep B E Ab: NONREACTIVE

## 2019-04-16 LAB — HIV-1 RNA QUANT-NO REFLEX-BLD
HIV 1 RNA Quant: <20 copies/mL — AB
HIV-1 RNA Quant, Log: <1.3 Log copies/mL — AB

## 2019-04-16 LAB — HEPATITIS B E ANTIGEN: Hep B E Ag: REACTIVE — AB

## 2019-04-25 ENCOUNTER — Ambulatory Visit: Payer: BLUE CROSS/BLUE SHIELD | Admitting: Internal Medicine

## 2019-04-29 ENCOUNTER — Encounter: Payer: Self-pay | Admitting: Internal Medicine

## 2019-04-29 ENCOUNTER — Ambulatory Visit (INDEPENDENT_AMBULATORY_CARE_PROVIDER_SITE_OTHER): Payer: BC Managed Care – PPO | Admitting: Internal Medicine

## 2019-04-29 ENCOUNTER — Other Ambulatory Visit: Payer: Self-pay

## 2019-04-29 DIAGNOSIS — B181 Chronic viral hepatitis B without delta-agent: Secondary | ICD-10-CM

## 2019-04-29 DIAGNOSIS — B20 Human immunodeficiency virus [HIV] disease: Secondary | ICD-10-CM

## 2019-04-29 NOTE — Progress Notes (Signed)
Patient Active Problem List   Diagnosis Date Noted  . Human immunodeficiency virus (HIV) disease ([place]) 07/28/2006    Priority: High  . Chronic hepatitis B virus infection ([place]) 07/28/2006    Priority: High  . Obesity 09/12/2011    Priority: Medium  . Essential hypertension 07/07/2009    Priority: Medium  . CIGARETTE SMOKER 05/08/2007    Priority: Medium  . GERD 07/28/2006    Priority: Medium  . Vasovagal near syncope 06/05/2018  . Acute upper respiratory infection 06/05/2018  . Family history of colon cancer 11/15/2017  . Chest pain 11/15/2017  . Abnormal TSH 11/15/2017  . Hyperglycemia 11/09/2016  . Cough 01/08/2016  . Wheezing 01/08/2016  . Peripheral edema 10/02/2015  . Pain of right heel 06/18/2013  . Preventative health care 05/31/2012  . Hyperlipidemia 05/31/2012  . Carpal tunnel syndrome, right 05/31/2012  . Dizziness 04/19/2011  . LOW BACK PAIN, ACUTE 05/27/2010  . GENITAL HERPES 08/22/2006  . VENEREAL WART 08/22/2006  . ANXIETY DISORDER, GENERALIZED 08/22/2006  . NEPHROLITHIASIS 08/22/2006  . PSORIASIS 08/22/2006  . DEGENERATIVE DISC DISEASE 08/22/2006  . CERVICAL RADICULOPATHY 08/22/2006  . PNEUMONIA 01/22/2006    Patient's Medications  New Prescriptions   No medications on file  Previous Medications   ALBUTEROL (VENTOLIN HFA) 108 (90 BASE) MCG/ACT INHALER    TAKE 2 PUFFS BY MOUTH EVERY 6 HOURS AS NEEDED FOR WHEEZE OR SHORTNESS OF BREATH   AMLODIPINE-OLMESARTAN (AZOR) 5-40 MG TABLET    Take 1 tablet by mouth daily.   ASPIRIN 81 MG TABLET    Take 81 mg by mouth daily.   ATORVASTATIN (LIPITOR) 20 MG TABLET    Take 1 tablet (20 mg total) by mouth daily. APPOINTMENT NEEDED FOR ADDITIONAL REFILLS   AUGMENTED BETAMETHASONE DIPROPIONATE (DIPROLENE-AF) 0.05 % CREAM    APPLY TO AFFECTED AREA UP TO TWICE A DAY AS NEEDED( NOT TO FACE,GROIN OR UNDERASRMS )   DICLOFENAC (VOLTAREN) 50 MG EC TABLET    1 tab by mouth twice per day as needed   DICLOFENAC  SODIUM (VOLTAREN) 1 % GEL    Apply 4 g topically 4 (four) times daily as needed.   GENVOYA 150-150-200-10 MG TABS TABLET    TAKE 1 TABLET BY MOUTH EVERY DAY WITH BREAKFAST   PANTOPRAZOLE (PROTONIX) 40 MG TABLET    Take 1 tablet (40 mg total) by mouth daily.   VALACYCLOVIR (VALTREX) 500 MG TABLET    Take 1 tablet (500 mg total) by mouth 2 (two) times daily.  Modified Medications   No medications on file  Discontinued Medications   No medications on file    Subjective: Anthony Steele is in for his routine HIV follow-up visit.  He has had no problems obtaining, taking or tolerating his Genvoya and never misses a single dose.  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  He continues to make intermittent trips to [place], [place] in the mountains around [place].  Has been feeling well and has no current complaints.  Review of Systems: Review of Systems  Constitutional: Negative for fever.  Respiratory: Negative for cough.   Cardiovascular: Negative for chest pain.  Gastrointestinal: Negative for abdominal pain, diarrhea, nausea and vomiting.    Past Medical History:  Diagnosis Date  . ANXIETY DISORDER, GENERALIZED 08/22/2006   Qualifier: Diagnosis of  By: [name], Calhoun Reichardt    . Carpal tunnel syndrome, right 05/31/2012  . CERVICAL RADICULOPATHY 08/22/2006  Annotation: L arm 2002 Qualifier: Diagnosis of  By: [name], Shantrell Placzek    . DEGENERATIVE [place] DISEASE 08/22/2006   Qualifier: Diagnosis of  By: [name], Avid Guillette    . GENITAL HERPES 08/22/2006   Qualifier: Diagnosis of  By: [name], Kalani Baray    . GERD 07/28/2006   Qualifier: Diagnosis of  By: [name], Derhonda Eastlick    . GERD (gastroesophageal reflux disease)   . HEPATITIS B, CHRONIC 07/28/2006   Qualifier: Diagnosis of  By: [name], Karas Pickerill    . HIV DISEASE 07/28/2006   Annotation: dx 2000 Qualifier: Diagnosis of  By: [name], Kadynce Bonds    . HIV infection ([place])   . Hyperlipidemia   . Hypertension   . HYPERTENSION  NEC 07/07/2009   Qualifier: Diagnosis of  By: [name], Maebell Lyvers    . Kidney stones   . NEPHROLITHIASIS 08/22/2006   Annotation: x2, last 1992 Qualifier: Diagnosis of  By: [name], Paiton Fosco    . Obesity 09/12/2011  . PSORIASIS 08/22/2006   Qualifier: Diagnosis of  By: [name], Krystie Leiter      Social History   Tobacco Use  . Smoking status: Former Smoker    Packs/day: 0.10    Types: Cigarettes    Quit date: 07/18/2012    Years since quitting: 6.7  . Smokeless tobacco: Never Used  . Tobacco comment: quit over cruise over the holidays  Substance Use Topics  . Alcohol use: Yes    Alcohol/week: 3.0 - 5.0 standard drinks    Types: 3 - 5 Standard drinks or equivalent per week    Comment: occ  . Drug use: No    Family History  Problem Relation Age of Onset  . Colon cancer Sister   . Heart disease Other   . Hypertension Other   . Arthritis Other   . Cancer Other        colon cancer  . Diabetes Maternal Aunt   . Heart failure Mother     Allergies  Allergen Reactions  . Lisinopril Rash    Health Maintenance  Topic Date Due  . INFLUENZA VACCINE  02/23/2019  . COLONOSCOPY  04/04/2023  . TETANUS/TDAP  02/18/2029  . Hepatitis C Screening  Completed  . HIV Screening  Completed    Objective:  Vitals:   04/29/19 1549  BP: 105/66  Pulse: 62  Temp: 98 F (36.7 C)  Height: 6' (1.829 m)   Body mass index is 32.57 kg/m.  Physical Exam Constitutional:      Comments: He is calm and in good spirits as usual.  Cardiovascular:     Rate and Rhythm: Normal rate and regular rhythm.     Heart sounds: No murmur.  Pulmonary:     Effort: Pulmonary effort is normal.     Breath sounds: Normal breath sounds.  Psychiatric:        Mood and Affect: Mood normal.     Lab Results Lab Results  Component Value Date   WBC 5.5 04/11/2019   HGB 13.8 04/11/2019   HCT 43.7 04/11/2019   MCV 105 (H) 04/11/2019   PLT 187 04/11/2019    Lab Results  Component Value Date   CREATININE  1.13 04/11/2019   BUN 29 (H) 04/11/2019   NA 140 04/11/2019   K 5.1 04/11/2019   CL 106 04/11/2019   CO2 28 04/11/2019    Lab Results  Component Value Date   ALT 33 04/11/2019   AST 28 04/11/2019   ALKPHOS  57 02/19/2019   BILITOT 0.7 04/11/2019    Lab Results  Component Value Date   CHOL 194 04/11/2019   HDL 50 04/11/2019   LDLCALC 125 (H) 04/11/2019   TRIG 89 04/11/2019   CHOLHDL 3.9 04/11/2019   Lab Results  Component Value Date   LABRPR NON-REACTIVE 04/11/2019   HIV 1 RNA Quant (copies/mL)  Date Value  04/11/2019 <20 DETECTED (A)  03/28/2018 <20 DETECTED (A)  03/23/2017 <20 DETECTED (A)   CD4 T Cell Abs (/uL)  Date Value  03/28/2018 790  03/23/2017 560  12/29/2015 380 (L)     Problem List Items Addressed This Visit      High   Human immunodeficiency virus (HIV) disease ([place])    His infection remains under excellent, long-term control.  He will continue Genvoya and follow-up after lab work in 1 year.  He received his influenza vaccine today.      Relevant Orders   CBC   T-helper cell (CD4)- ([facility] clinic only)   Comprehensive metabolic panel   Lipid panel   RPR   HIV-1 RNA quant-no reflex-bld   Chronic hepatitis B virus infection ([place])    He has chronic hepatitis B, e antigen positive and his viral load is undetectable at less than 10.  He will continue Genvoya and follow-up after lab work in 1 year.           Michel Bickers, MD [facility] [place] [facility] [phone] pager   [phone] cell 04/29/2019, 4:05 PM

## 2019-04-29 NOTE — Assessment & Plan Note (Signed)
He has chronic hepatitis B, e antigen positive and his viral load is undetectable at less than 10.  He will continue Genvoya and follow-up after lab work in 1 year.

## 2019-04-29 NOTE — Assessment & Plan Note (Signed)
His infection remains under excellent, long-term control.  He will continue Genvoya and follow-up after lab work in 1 year.  He received his influenza vaccine today.

## 2019-05-02 ENCOUNTER — Other Ambulatory Visit: Payer: Self-pay | Admitting: Internal Medicine

## 2019-05-02 DIAGNOSIS — B20 Human immunodeficiency virus [HIV] disease: Secondary | ICD-10-CM

## 2019-07-16 ENCOUNTER — Other Ambulatory Visit: Payer: Self-pay | Admitting: Internal Medicine

## 2019-08-02 ENCOUNTER — Ambulatory Visit: Payer: BC Managed Care – PPO | Admitting: Family Medicine

## 2019-08-02 ENCOUNTER — Other Ambulatory Visit: Payer: Self-pay

## 2019-08-02 ENCOUNTER — Encounter: Payer: Self-pay | Admitting: Family Medicine

## 2019-08-02 ENCOUNTER — Ambulatory Visit: Payer: Self-pay

## 2019-08-02 DIAGNOSIS — M79662 Pain in left lower leg: Secondary | ICD-10-CM | POA: Diagnosis not present

## 2019-08-02 NOTE — Progress Notes (Signed)
Office Visit Note   Patient: Anthony Steele           Date of Birth: 1960-09-12           MRN: [id number] Visit Date: 08/02/2019 Requested by: Biagio Borg, MD [address] [place],  [place] [address] PCP: Biagio Borg, MD  Subjective: Chief Complaint  Patient presents with  . Left Lower Leg - Pain    Felt a pop in the calf yesterday morning at 2:30 while in bed. Elevated and iced. Same thing happened on the right months ago (got better with PT).    HPI: He is here with left calf pain.  Yesterday while in bed, he felt something pop on the medial side of his calf.  Immediate pain.  He has been able to bear weight but with a limp.  The same thing happened to his right calf last April.  He got better with physical therapy and is still pain-free today.  He is wondering why this keeps happening.               ROS:   All other systems were reviewed and are negative.  Objective: Vital Signs: There were no vitals taken for this visit.  Physical Exam:  General:  Alert and oriented, in no acute distress. Pulm:  Breathing unlabored. Psy:  Normal mood, congruent affect. Skin: No bruising Left leg: There is no tenderness to palpation of the Achilles.  He is tender near the medial gastrocnemius at the musculotendinous junction.  He has pain with plantarflexion against resistance, no pain with great toe lection or with foot supination.  He does have quite a few varicose veins in both legs.  Imaging: Limited diagnostic ultrasound: There appears to be a strain pattern in the medial soleus muscle.  The gastrocnemius looks intact.  I do not see any vascular abnormalities.  Assessment & Plan: 1.  Left calf strain -Physical therapy for modalities to treat acute symptoms, then for flexibility and strengthening for long-term. -He will wear compression socks long-term.  Follow-up as needed.     Procedures: No procedures performed  No notes on file     PMFS History: Patient Active  Problem List   Diagnosis Date Noted  . Vasovagal near syncope 06/05/2018  . Acute upper respiratory infection 06/05/2018  . Family history of colon cancer 11/15/2017  . Chest pain 11/15/2017  . Abnormal TSH 11/15/2017  . Hyperglycemia 11/09/2016  . Cough 01/08/2016  . Wheezing 01/08/2016  . Peripheral edema 10/02/2015  . Pain of right heel 06/18/2013  . Preventative health care 05/31/2012  . Hyperlipidemia 05/31/2012  . Carpal tunnel syndrome, right 05/31/2012  . Obesity 09/12/2011  . Dizziness 04/19/2011  . LOW BACK PAIN, ACUTE 05/27/2010  . Essential hypertension 07/07/2009  . CIGARETTE SMOKER 05/08/2007  . GENITAL HERPES 08/22/2006  . VENEREAL WART 08/22/2006  . ANXIETY DISORDER, GENERALIZED 08/22/2006  . NEPHROLITHIASIS 08/22/2006  . PSORIASIS 08/22/2006  . DEGENERATIVE DISC DISEASE 08/22/2006  . CERVICAL RADICULOPATHY 08/22/2006  . Human immunodeficiency virus (HIV) disease ([place]) 07/28/2006  . Chronic hepatitis B virus infection ([place]) 07/28/2006  . GERD 07/28/2006  . PNEUMONIA 01/22/2006   Past Medical History:  Diagnosis Date  . ANXIETY DISORDER, GENERALIZED 08/22/2006   Qualifier: Diagnosis of  By: [name], [name]    . Carpal tunnel syndrome, right 05/31/2012  . CERVICAL RADICULOPATHY 08/22/2006   Annotation: L arm 2002 Qualifier: Diagnosis of  By: [name], [name]    . DEGENERATIVE  [place] DISEASE 08/22/2006   Qualifier: Diagnosis of  By: [name], [name]    . GENITAL HERPES 08/22/2006   Qualifier: Diagnosis of  By: [name], [name]    . GERD 07/28/2006   Qualifier: Diagnosis of  By: [name], [name]    . GERD (gastroesophageal reflux disease)   . HEPATITIS B, CHRONIC 07/28/2006   Qualifier: Diagnosis of  By: [name], [name]    . HIV DISEASE 07/28/2006   Annotation: dx 2000 Qualifier: Diagnosis of  By: [name], [name]    . HIV infection ([place])   . Hyperlipidemia   . Hypertension   . HYPERTENSION NEC 07/07/2009   Qualifier: Diagnosis of  By: [name], [name]     . Kidney stones   . NEPHROLITHIASIS 08/22/2006   Annotation: x2, last 1992 Qualifier: Diagnosis of  By: [name], [name]    . Obesity 09/12/2011  . PSORIASIS 08/22/2006   Qualifier: Diagnosis of  By: [name], [name]      Family History  Problem Relation Age of Onset  . Colon cancer Sister   . Heart disease Other   . Hypertension Other   . Arthritis Other   . Cancer Other        colon cancer  . Diabetes Maternal Aunt   . Heart failure Mother     Past Surgical History:  Procedure Laterality Date  . COLONOSCOPY    . CYSTECTOMY  2000   cyst removed off buttock cheek   Social History   Occupational History  . Not on file  Tobacco Use  . Smoking status: Former Smoker    Packs/day: 0.10    Types: Cigarettes    Quit date: 07/18/2012    Years since quitting: 7.0  . Smokeless tobacco: Never Used  . Tobacco comment: quit over cruise over the holidays  Substance and Sexual Activity  . Alcohol use: Yes    Alcohol/week: 3.0 - 5.0 standard drinks    Types: 3 - 5 Standard drinks or equivalent per week    Comment: occ  . Drug use: No  . Sexual activity: Not Currently    Comment: decline condoms

## 2019-08-07 DIAGNOSIS — M545 Low back pain: Secondary | ICD-10-CM | POA: Diagnosis not present

## 2019-08-07 DIAGNOSIS — M79662 Pain in left lower leg: Secondary | ICD-10-CM | POA: Diagnosis not present

## 2019-08-07 DIAGNOSIS — M5416 Radiculopathy, lumbar region: Secondary | ICD-10-CM | POA: Diagnosis not present

## 2019-08-08 ENCOUNTER — Encounter: Payer: Self-pay | Admitting: Family Medicine

## 2019-08-08 DIAGNOSIS — M79661 Pain in right lower leg: Secondary | ICD-10-CM

## 2019-08-08 DIAGNOSIS — M79662 Pain in left lower leg: Secondary | ICD-10-CM

## 2019-08-09 DIAGNOSIS — M5416 Radiculopathy, lumbar region: Secondary | ICD-10-CM | POA: Diagnosis not present

## 2019-08-09 DIAGNOSIS — M545 Low back pain: Secondary | ICD-10-CM | POA: Diagnosis not present

## 2019-08-09 DIAGNOSIS — M79662 Pain in left lower leg: Secondary | ICD-10-CM | POA: Diagnosis not present

## 2019-08-09 NOTE — Addendum Note (Signed)
Addended by: Hortencia Pilar on: 08/09/2019 02:50 PM   Modules accepted: Orders

## 2019-08-12 ENCOUNTER — Ambulatory Visit: Payer: BC Managed Care – PPO

## 2019-08-12 ENCOUNTER — Other Ambulatory Visit: Payer: Self-pay

## 2019-08-12 DIAGNOSIS — M79662 Pain in left lower leg: Secondary | ICD-10-CM | POA: Diagnosis not present

## 2019-08-12 DIAGNOSIS — M79661 Pain in right lower leg: Secondary | ICD-10-CM | POA: Diagnosis not present

## 2019-08-12 DIAGNOSIS — M545 Low back pain: Secondary | ICD-10-CM | POA: Diagnosis not present

## 2019-08-12 DIAGNOSIS — M5416 Radiculopathy, lumbar region: Secondary | ICD-10-CM | POA: Diagnosis not present

## 2019-08-12 LAB — COMPREHENSIVE METABOLIC PANEL
AG Ratio: 2 (calc) (ref 1.0–2.5)
ALT: 28 U/L (ref 9–46)
AST: 26 U/L (ref 10–35)
Albumin: 4.5 g/dL (ref 3.6–5.1)
Alkaline phosphatase (APISO): 54 U/L (ref 35–144)
BUN: 24 mg/dL (ref 7–25)
CO2: 28 mmol/L (ref 20–32)
Calcium: 9.6 mg/dL (ref 8.6–10.3)
Chloride: 104 mmol/L (ref 98–110)
Creat: 1.05 mg/dL (ref 0.70–1.33)
Globulin: 2.3 g/dL (calc) (ref 1.9–3.7)
Glucose, Bld: 123 mg/dL — ABNORMAL HIGH (ref 65–99)
Potassium: 4.9 mmol/L (ref 3.5–5.3)
Sodium: 140 mmol/L (ref 135–146)
Total Bilirubin: 0.5 mg/dL (ref 0.2–1.2)
Total Protein: 6.8 g/dL (ref 6.1–8.1)

## 2019-08-12 NOTE — Progress Notes (Signed)
CMET drawn per Dr. Junius Roads

## 2019-08-13 ENCOUNTER — Telehealth: Payer: Self-pay | Admitting: Family Medicine

## 2019-08-13 DIAGNOSIS — M79661 Pain in right lower leg: Secondary | ICD-10-CM

## 2019-08-13 DIAGNOSIS — R200 Anesthesia of skin: Secondary | ICD-10-CM

## 2019-08-13 DIAGNOSIS — M79662 Pain in left lower leg: Secondary | ICD-10-CM

## 2019-08-13 NOTE — Telephone Encounter (Signed)
Metabolic panel is normal except for glucose of 123.

## 2019-08-15 NOTE — Addendum Note (Signed)
Addended by: Hortencia Pilar on: 08/15/2019 08:00 AM   Modules accepted: Orders

## 2019-08-16 ENCOUNTER — Other Ambulatory Visit: Payer: Self-pay | Admitting: Family Medicine

## 2019-08-16 DIAGNOSIS — M79661 Pain in right lower leg: Secondary | ICD-10-CM

## 2019-08-16 DIAGNOSIS — M545 Low back pain: Secondary | ICD-10-CM | POA: Diagnosis not present

## 2019-08-16 DIAGNOSIS — M5416 Radiculopathy, lumbar region: Secondary | ICD-10-CM | POA: Diagnosis not present

## 2019-08-16 DIAGNOSIS — M79662 Pain in left lower leg: Secondary | ICD-10-CM

## 2019-08-16 DIAGNOSIS — R200 Anesthesia of skin: Secondary | ICD-10-CM

## 2019-08-28 ENCOUNTER — Other Ambulatory Visit: Payer: Self-pay

## 2019-08-28 DIAGNOSIS — R200 Anesthesia of skin: Secondary | ICD-10-CM

## 2019-08-28 DIAGNOSIS — M79661 Pain in right lower leg: Secondary | ICD-10-CM

## 2019-08-28 DIAGNOSIS — M79662 Pain in left lower leg: Secondary | ICD-10-CM

## 2019-09-13 ENCOUNTER — Other Ambulatory Visit: Payer: BC Managed Care – PPO

## 2019-09-17 ENCOUNTER — Ambulatory Visit (INDEPENDENT_AMBULATORY_CARE_PROVIDER_SITE_OTHER): Payer: BC Managed Care – PPO | Admitting: Neurology

## 2019-09-17 ENCOUNTER — Telehealth: Payer: Self-pay | Admitting: Family Medicine

## 2019-09-17 ENCOUNTER — Other Ambulatory Visit: Payer: Self-pay

## 2019-09-17 DIAGNOSIS — R200 Anesthesia of skin: Secondary | ICD-10-CM

## 2019-09-17 DIAGNOSIS — M79661 Pain in right lower leg: Secondary | ICD-10-CM

## 2019-09-17 DIAGNOSIS — M79662 Pain in left lower leg: Secondary | ICD-10-CM

## 2019-09-17 NOTE — Procedures (Signed)
[facility]  [address], [place]  [place],  [address] Tel: [phone] Fax:  [phone] Test Date:  09/17/2019  Patient: Anthony Steele DOB: [birth year] Physician: Narda Amber, DO  Sex: Male Height: 6\' 0"  Ref Phys: Eunice Blase, MD  ID#: [id number] Temp: 31.0C Technician:    Patient Complaints: This is a 59 year old man referred for evaluation of bilateral leg pain.  NCV & EMG Findings: Extensive electrodiagnostic testing of the right lower extremity and additional studies of the left shows:  1. Bilateral sural and superficial peroneal sensory responses are within normal limits. 2. Bilateral peroneal motor responses are within normal limits.  Bilateral tibial motor responses show symmetrically reduced amplitude (R2.1, L2.9 mV); in isolation, these findings are of uncertain clinical significance. 3. Bilateral tibial H reflex studies are within normal limits. 4. There is no evidence of active or chronic motor axonal loss changes affecting any of the tested muscles.  Motor unit configuration and recruitment pattern is within normal limits.  Impression: This is a normal study of the lower extremities.  In particular, there is no evidence of a sensorimotor polyneuropathy or lumbosacral radiculopathy.   ___________________________ Narda Amber, DO    Nerve Conduction Studies Anti Sensory Summary Table   Site NR Peak (ms) Norm Peak (ms) P-T Amp (V) Norm P-T Amp  Left Sup Peroneal Anti Sensory (Ant Lat Mall)  31C  12 cm    3.0 <4.6 7.0 >4  Right Sup Peroneal Anti Sensory (Ant Lat Mall)  31C  12 cm    2.5 <4.6 8.2 >4  Left Sural Anti Sensory (Lat Mall)  31C  Calf    3.0 <4.6 11.5 >4  Right Sural Anti Sensory (Lat Mall)  31C  Calf    2.8 <4.6 10.1 >4   Motor Summary Table   Site NR Onset (ms) Norm Onset (ms) O-P Amp (mV) Norm O-P Amp Site1 Site2 Delta-0 (ms) Dist (cm) Vel (m/s) Norm Vel (m/s)  Left Peroneal Motor (Ext Dig Brev)  31C  Ankle    4.5  <6.0 5.3 >2.5 B Fib Ankle 8.5 38.0 45 >40  B Fib    13.0  3.7  Poplt B Fib 1.7 9.0 53 >40  Poplt    14.7  3.8         Right Peroneal Motor (Ext Dig Brev)  31C  Ankle    3.7 <6.0 7.2 >2.5 B Fib Ankle 7.3 38.0 52 >40  B Fib    11.0  7.3  Poplt B Fib 1.7 10.0 59 >40  Poplt    12.7  7.1         Left Tibial Motor (Abd Hall Brev)  31C  Ankle    3.1 <6.0 2.9 >4 Knee Ankle 10.3 41.0 40 >40  Knee    13.4  1.3         Right Tibial Motor (Abd Hall Brev)  31C  Ankle    3.7 <6.0 2.1 >4 Knee Ankle 10.1 42.0 42 >40  Knee    13.8  1.3          H Reflex Studies   NR H-Lat (ms) Lat Norm (ms) L-R H-Lat (ms)  Left Tibial (Gastroc)  31C     33.74 <35 0.95  Right Tibial (Gastroc)  31C     34.69 <35 0.95   EMG   Side Muscle Ins Act Fibs Psw Fasc Number Recrt Dur Dur. Amp Amp. Poly Poly. Comment  Right AntTibialis Nml Nml Nml Nml Nml  Nml Nml Nml Nml Nml Nml Nml N/A  Right Gastroc Nml Nml Nml Nml Nml Nml Nml Nml Nml Nml Nml Nml N/A  Right Flex Dig Long Nml Nml Nml Nml Nml Nml Nml Nml Nml Nml Nml Nml N/A  Right RectFemoris Nml Nml Nml Nml Nml Nml Nml Nml Nml Nml Nml Nml N/A  Right GluteusMed Nml Nml Nml Nml Nml Nml Nml Nml Nml Nml Nml Nml N/A  Right BicepsFemS Nml Nml Nml Nml Nml Nml Nml Nml Nml Nml Nml Nml N/A  Left BicepsFemS Nml Nml Nml Nml Nml Nml Nml Nml Nml Nml Nml Nml N/A  Left AntTibialis Nml Nml Nml Nml Nml Nml Nml Nml Nml Nml Nml Nml N/A  Left Gastroc Nml Nml Nml Nml Nml Nml Nml Nml Nml Nml Nml Nml N/A  Left Flex Dig Long Nml Nml Nml Nml Nml Nml Nml Nml Nml Nml Nml Nml N/A  Left RectFemoris Nml Nml Nml Nml Nml Nml Nml Nml Nml Nml Nml Nml N/A  Left GluteusMed Nml Nml Nml Nml Nml Nml Nml Nml Nml Nml Nml Nml N/A      Waveforms:

## 2019-09-17 NOTE — Telephone Encounter (Signed)
Nerve studies were normal.

## 2019-09-19 ENCOUNTER — Other Ambulatory Visit: Payer: Self-pay | Admitting: Family Medicine

## 2019-09-19 DIAGNOSIS — M5432 Sciatica, left side: Secondary | ICD-10-CM

## 2019-09-19 DIAGNOSIS — R200 Anesthesia of skin: Secondary | ICD-10-CM

## 2019-09-24 ENCOUNTER — Encounter: Payer: Self-pay | Admitting: Internal Medicine

## 2019-09-24 DIAGNOSIS — L989 Disorder of the skin and subcutaneous tissue, unspecified: Secondary | ICD-10-CM

## 2019-09-27 DIAGNOSIS — B079 Viral wart, unspecified: Secondary | ICD-10-CM | POA: Diagnosis not present

## 2019-09-30 ENCOUNTER — Other Ambulatory Visit: Payer: BC Managed Care – PPO

## 2019-10-24 DIAGNOSIS — M5416 Radiculopathy, lumbar region: Secondary | ICD-10-CM | POA: Diagnosis not present

## 2019-10-24 DIAGNOSIS — M545 Low back pain: Secondary | ICD-10-CM | POA: Diagnosis not present

## 2019-10-31 DIAGNOSIS — M545 Low back pain: Secondary | ICD-10-CM | POA: Diagnosis not present

## 2019-10-31 DIAGNOSIS — M5416 Radiculopathy, lumbar region: Secondary | ICD-10-CM | POA: Diagnosis not present

## 2019-11-18 ENCOUNTER — Ambulatory Visit: Payer: BC Managed Care – PPO | Admitting: Dermatology

## 2019-12-03 NOTE — Telephone Encounter (Signed)
Reached out to the patient. He will gather necessary information to apply for assistance and give [place] a call back at the clinic. Will attempt PAF and/or Good Days to cover the gap until 07/25/2020

## 2020-01-20 DIAGNOSIS — H1131 Conjunctival hemorrhage, right eye: Secondary | ICD-10-CM | POA: Diagnosis not present

## 2020-02-14 ENCOUNTER — Encounter: Payer: Self-pay | Admitting: Internal Medicine

## 2020-02-23 ENCOUNTER — Other Ambulatory Visit: Payer: Self-pay | Admitting: Internal Medicine

## 2020-02-23 NOTE — Telephone Encounter (Signed)
Please refill as per office routine med refill policy (all routine meds refilled for 3 mo or monthly per pt preference up to one year from last visit, then month to month grace period for 3 mo, then further med refills will have to be denied)  

## 2020-02-24 ENCOUNTER — Other Ambulatory Visit: Payer: Self-pay | Admitting: Internal Medicine

## 2020-02-24 DIAGNOSIS — A6000 Herpesviral infection of urogenital system, unspecified: Secondary | ICD-10-CM

## 2020-02-25 ENCOUNTER — Other Ambulatory Visit: Payer: Self-pay

## 2020-02-25 ENCOUNTER — Ambulatory Visit (INDEPENDENT_AMBULATORY_CARE_PROVIDER_SITE_OTHER): Payer: BC Managed Care – PPO | Admitting: Internal Medicine

## 2020-02-25 ENCOUNTER — Encounter: Payer: Self-pay | Admitting: Internal Medicine

## 2020-02-25 VITALS — BP 110/68 | HR 73 | Temp 98.9°F | Ht 72.0 in | Wt 273.0 lb

## 2020-02-25 DIAGNOSIS — Z0000 Encounter for general adult medical examination without abnormal findings: Secondary | ICD-10-CM | POA: Diagnosis not present

## 2020-02-25 DIAGNOSIS — Z23 Encounter for immunization: Secondary | ICD-10-CM

## 2020-02-25 DIAGNOSIS — R739 Hyperglycemia, unspecified: Secondary | ICD-10-CM

## 2020-02-25 LAB — COMPLETE METABOLIC PANEL WITH GFR
AG Ratio: 1.9 (calc) (ref 1.0–2.5)
ALT: 25 U/L (ref 9–46)
AST: 34 U/L (ref 10–35)
Albumin: 4.7 g/dL (ref 3.6–5.1)
Alkaline phosphatase (APISO): 63 U/L (ref 35–144)
BUN: 22 mg/dL (ref 7–25)
CO2: 29 mmol/L (ref 20–32)
Calcium: 9.8 mg/dL (ref 8.6–10.3)
Chloride: 103 mmol/L (ref 98–110)
Creat: 1.23 mg/dL (ref 0.70–1.33)
GFR, Est African American: 75 mL/min/{1.73_m2} (ref >=60)
GFR, Est Non African American: 65 mL/min/{1.73_m2} (ref >=60)
Globulin: 2.5 g/dL (calc) (ref 1.9–3.7)
Glucose, Bld: 98 mg/dL (ref 65–99)
Potassium: 5 mmol/L (ref 3.5–5.3)
Sodium: 140 mmol/L (ref 135–146)
Total Bilirubin: 0.8 mg/dL (ref 0.2–1.2)
Total Protein: 7.2 g/dL (ref 6.1–8.1)

## 2020-02-25 NOTE — Patient Instructions (Addendum)
You had the Shingles shot #1 today; please return for nurse visit for shingles shot #2 in 2 months  Please continue all other medications as before  Please have the pharmacy call with any other refills you may need.  Please continue your efforts at being more active, low cholesterol diet, and weight control.  You are otherwise up to date with prevention measures today.  Please keep your appointments with your specialists as you may have planned  Please go to the LAB at the blood drawing area for the tests to be done  You will be contacted by phone if any changes need to be made immediately.  Otherwise, you will receive a letter about your results with an explanation, but please check with MyChart first.  Please remember to sign up for MyChart if you have not done so, as this will be important to you in the future with finding out test results, communicating by private email, and scheduling acute appointments online when needed.  Please make an Appointment to return for your 1 year visit, or sooner if needed

## 2020-02-25 NOTE — Progress Notes (Addendum)
Subjective:    Patient ID: Anthony Steele, male    DOB: 1960-10-20, 59 y.o.   MRN: [id number]  HPI  Here for wellness and f/u;  Overall doing ok;  Pt denies Chest pain, worsening SOB, DOE, wheezing, orthopnea, PND, worsening LE edema, palpitations, dizziness or syncope.  Pt denies neurological change such as new headache, facial or extremity weakness.  Pt denies polydipsia, polyuria, or low sugar symptoms. Pt states overall good compliance with treatment and medications, good tolerability, and has been trying to follow appropriate diet.  Pt denies worsening depressive symptoms, suicidal ideation or panic. No fever, night sweats, wt loss, loss of appetite, or other constitutional symptoms.  Pt states good ability with ADL's, has low fall risk, home safety reviewed and adequate, no other significant changes in hearing or vision, and only occasionally active with exercise. No new complaints Past Medical History:  Diagnosis Date  . ANXIETY DISORDER, GENERALIZED 08/22/2006   Qualifier: Diagnosis of  By: [name], Oceane Fosse    . Carpal tunnel syndrome, right 05/31/2012  . CERVICAL RADICULOPATHY 08/22/2006   Annotation: L arm 2002 Qualifier: Diagnosis of  By: [name], Nicle Connole    . DEGENERATIVE [place] DISEASE 08/22/2006   Qualifier: Diagnosis of  By: [name], Elgar Scoggins    . GENITAL HERPES 08/22/2006   Qualifier: Diagnosis of  By: [name], Massey Ruhland    . GERD 07/28/2006   Qualifier: Diagnosis of  By: [name], Carle Dargan    . GERD (gastroesophageal reflux disease)   . HEPATITIS B, CHRONIC 07/28/2006   Qualifier: Diagnosis of  By: [name], Savilla Turbyfill    . HIV DISEASE 07/28/2006   Annotation: dx 2000 Qualifier: Diagnosis of  By: [name], [place] Gustafson    . HIV infection ([place])   . Hyperlipidemia   . Hypertension   . HYPERTENSION NEC 07/07/2009   Qualifier: Diagnosis of  By: [name], Cherrelle Plante    . Kidney stones   . NEPHROLITHIASIS 08/22/2006   Annotation: x2, last 1992 Qualifier: Diagnosis of  By: [name], Evelyna Folker    .  Obesity 09/12/2011  . PSORIASIS 08/22/2006   Qualifier: Diagnosis of  By: [name], Christiano Blandon     Past Surgical History:  Procedure Laterality Date  . COLONOSCOPY    . CYSTECTOMY  2000   cyst removed off buttock cheek    reports that he quit smoking about 7 years ago. His smoking use included cigarettes. He smoked 0.10 packs per day. He has never used smokeless tobacco. He reports current alcohol use of about 3.0 - 5.0 standard drinks of alcohol per week. He reports that he does not use drugs. family history includes Arthritis in an other family member; Cancer in an other family member; Colon cancer in his sister; Diabetes in his maternal aunt; Heart disease in an other family member; Heart failure in his mother; Hypertension in an other family member. Allergies  Allergen Reactions  . Lisinopril Rash   Current Outpatient Medications on File Prior to Visit  Medication Sig Dispense Refill  . albuterol (VENTOLIN HFA) 108 (90 Base) MCG/ACT inhaler TAKE 2 PUFFS BY MOUTH EVERY 6 HOURS AS NEEDED FOR WHEEZE OR SHORTNESS OF BREATH 18 g 11  . amLODipine-olmesartan (AZOR) 5-40 MG tablet Take 1 tablet by mouth daily. 90 tablet 3  . aspirin 81 MG tablet Take 81 mg by mouth daily.    Marland Kitchen augmented betamethasone dipropionate (DIPROLENE-AF) 0.05 % cream APPLY TO AFFECTED AREA UP TO TWICE A DAY AS NEEDED( NOT TO FACE,GROIN  OR UNDERASRMS )    . diclofenac (VOLTAREN) 50 MG EC tablet 1 tab by mouth twice per day as needed 180 tablet 3  . diclofenac Sodium (VOLTAREN) 1 % GEL APPLY 4 G TOPICALLY 4 (FOUR) TIMES DAILY AS NEEDED. 400 g 3  . GENVOYA 150-150-200-10 MG TABS tablet TAKE 1 TABLET BY MOUTH EVERY DAY WITH BREAKFAST 30 tablet 11  . pantoprazole (PROTONIX) 40 MG tablet Take 1 tablet (40 mg total) by mouth daily. 90 tablet 3  . valACYclovir (VALTREX) 500 MG tablet TAKE 1 TABLET BY MOUTH TWICE A DAY 180 tablet 3  . atorvastatin (LIPITOR) 20 MG tablet TAKE 1 TABLET (20 MG TOTAL) BY MOUTH DAILY. APPOINTMENT NEEDED  FOR ADDITIONAL REFILLS 90 tablet 3   No current facility-administered medications on file prior to visit.   Review of Systems All otherwise neg per pt    Objective:   Physical Exam BP 110/68 (BP Location: Left Arm, Patient Position: Sitting, Cuff Size: Large)   Pulse 73   Temp 98.9 F (37.2 C) (Oral)   Ht 6' (1.829 m)   Wt 273 lb (123.8 kg)   SpO2 97%   BMI 37.03 kg/m  VS noted,  Constitutional: Pt appears in NAD HENT: Head: NCAT.  Right Ear: External ear normal.  Left Ear: External ear normal.  Eyes: . Pupils are equal, round, and reactive to light. Conjunctivae and EOM are normal Nose: without d/c or deformity Neck: Neck supple. Gross normal ROM Cardiovascular: Normal rate and regular rhythm.   Pulmonary/Chest: Effort normal and breath sounds without rales or wheezing.  Abd:  Soft, NT, ND, + BS, no organomegaly Neurological: Pt is alert. At baseline orientation, motor grossly intact Skin: Skin is warm. No rashes, other new lesions, no LE edema Psychiatric: Pt behavior is normal without agitation  All otherwise neg per pt Lab Results  Component Value Date   WBC 5.6 02/25/2020   HGB 13.1 (L) 02/25/2020   HCT 38.9 02/25/2020   PLT 202 02/25/2020   GLUCOSE 98 02/25/2020   CHOL 168 02/25/2020   TRIG 96 02/25/2020   HDL 52 02/25/2020   LDLCALC 97 02/25/2020   ALT 25 02/25/2020   AST 34 02/25/2020   NA 140 02/25/2020   K 5.0 02/25/2020   CL 103 02/25/2020   CREATININE 1.23 02/25/2020   BUN 22 02/25/2020   CO2 29 02/25/2020   TSH 3.92 02/25/2020   PSA 0.2 02/25/2020   HGBA1C 5.4 02/25/2020         Assessment & Plan:

## 2020-02-26 ENCOUNTER — Encounter: Payer: Self-pay | Admitting: Internal Medicine

## 2020-02-26 LAB — URINALYSIS, ROUTINE W REFLEX MICROSCOPIC
Bilirubin Urine: NEGATIVE
Glucose, UA: NEGATIVE
Hgb urine dipstick: NEGATIVE
Ketones, ur: NEGATIVE
Leukocytes,Ua: NEGATIVE
Nitrite: NEGATIVE
Protein, ur: NEGATIVE
Specific Gravity, Urine: 1.012 (ref 1.001–1.03)
pH: 6 (ref 5.0–8.0)

## 2020-02-26 LAB — CBC WITH DIFFERENTIAL/PLATELET
Absolute Monocytes: 431 cells/uL (ref 200–950)
Basophils Absolute: 39 cells/uL (ref 0–200)
Basophils Relative: 0.7 %
Eosinophils Absolute: 90 cells/uL (ref 15–500)
Eosinophils Relative: 1.6 %
HCT: 38.9 % (ref 38.5–50.0)
Hemoglobin: 13.1 g/dL — ABNORMAL LOW (ref 13.2–17.1)
Lymphs Abs: 2178 cells/uL (ref 850–3900)
MCH: 33.3 pg — ABNORMAL HIGH (ref 27.0–33.0)
MCHC: 33.7 g/dL (ref 32.0–36.0)
MCV: 99 fL (ref 80.0–100.0)
MPV: 10.6 fL (ref 7.5–12.5)
Monocytes Relative: 7.7 %
Neutro Abs: 2862 cells/uL (ref 1500–7800)
Neutrophils Relative %: 51.1 %
Platelets: 202 10*3/uL (ref 140–400)
RBC: 3.93 10*6/uL — ABNORMAL LOW (ref 4.20–5.80)
RDW: 11.9 % (ref 11.0–15.0)
Total Lymphocyte: 38.9 %
WBC: 5.6 10*3/uL (ref 3.8–10.8)

## 2020-02-26 LAB — LIPID PANEL
Cholesterol: 168 mg/dL (ref <200)
HDL: 52 mg/dL (ref >=40)
LDL Cholesterol (Calc): 97 mg/dL (calc)
Non-HDL Cholesterol (Calc): 116 mg/dL (calc) (ref <130)
Total CHOL/HDL Ratio: 3.2 (calc) (ref <5.0)
Triglycerides: 96 mg/dL (ref <150)

## 2020-02-26 LAB — PSA: PSA: 0.2 ng/mL (ref <=4.0)

## 2020-02-26 LAB — TSH: TSH: 3.92 mIU/L (ref 0.40–4.50)

## 2020-02-26 LAB — HEMOGLOBIN A1C
Hgb A1c MFr Bld: 5.4 % of total Hgb (ref <5.7)
Mean Plasma Glucose: 108 (calc)
eAG (mmol/L): 6 (calc)

## 2020-02-26 NOTE — Assessment & Plan Note (Signed)
stable overall by history and exam, recent data reviewed with pt, and pt to continue medical treatment as before,  to f/u any worsening symptoms or concerns  

## 2020-02-26 NOTE — Assessment & Plan Note (Signed)

## 2020-03-14 ENCOUNTER — Other Ambulatory Visit: Payer: Self-pay | Admitting: Internal Medicine

## 2020-03-14 NOTE — Telephone Encounter (Signed)
Please refill as per office routine med refill policy (all routine meds refilled for 3 mo or monthly per pt preference up to one year from last visit, then month to month grace period for 3 mo, then further med refills will have to be denied)  

## 2020-03-16 ENCOUNTER — Encounter: Payer: Self-pay | Admitting: Internal Medicine

## 2020-03-16 MED ORDER — AMLODIPINE-OLMESARTAN 5-40 MG PO TABS: 1.0000 | ORAL_TABLET | Freq: Every day | ORAL | 3 refills | Status: DC

## 2020-04-14 ENCOUNTER — Other Ambulatory Visit: Payer: Self-pay

## 2020-04-14 ENCOUNTER — Other Ambulatory Visit: Payer: BC Managed Care – PPO

## 2020-04-14 DIAGNOSIS — B20 Human immunodeficiency virus [HIV] disease: Secondary | ICD-10-CM

## 2020-04-15 LAB — T-HELPER CELL (CD4) - (RCID CLINIC ONLY)
CD4 % Helper T Cell: 27 % — ABNORMAL LOW (ref 33–65)
CD4 T Cell Abs: 465 /uL (ref 400–1790)

## 2020-04-17 LAB — COMPREHENSIVE METABOLIC PANEL
AG Ratio: 1.8 (calc) (ref 1.0–2.5)
ALT: 32 U/L (ref 9–46)
AST: 38 U/L — ABNORMAL HIGH (ref 10–35)
Albumin: 4.6 g/dL (ref 3.6–5.1)
Alkaline phosphatase (APISO): 57 U/L (ref 35–144)
BUN/Creatinine Ratio: 25 (calc) — ABNORMAL HIGH (ref 6–22)
BUN: 26 mg/dL — ABNORMAL HIGH (ref 7–25)
CO2: 23 mmol/L (ref 20–32)
Calcium: 9.7 mg/dL (ref 8.6–10.3)
Chloride: 105 mmol/L (ref 98–110)
Creat: 1.02 mg/dL (ref 0.70–1.33)
Globulin: 2.6 g/dL (calc) (ref 1.9–3.7)
Glucose, Bld: 128 mg/dL — ABNORMAL HIGH (ref 65–99)
Potassium: 4.7 mmol/L (ref 3.5–5.3)
Sodium: 137 mmol/L (ref 135–146)
Total Bilirubin: 0.6 mg/dL (ref 0.2–1.2)
Total Protein: 7.2 g/dL (ref 6.1–8.1)

## 2020-04-17 LAB — LIPID PANEL
Cholesterol: 158 mg/dL (ref <200)
HDL: 51 mg/dL (ref >=40)
LDL Cholesterol (Calc): 91 mg/dL (calc)
Non-HDL Cholesterol (Calc): 107 mg/dL (calc) (ref <130)
Total CHOL/HDL Ratio: 3.1 (calc) (ref <5.0)
Triglycerides: 72 mg/dL (ref <150)

## 2020-04-17 LAB — HIV-1 RNA QUANT-NO REFLEX-BLD
HIV 1 RNA Quant: <20 Copies/mL — ABNORMAL HIGH
HIV-1 RNA Quant, Log: <1.3 Log cps/mL — ABNORMAL HIGH

## 2020-04-17 LAB — CBC
HCT: 38.4 % — ABNORMAL LOW (ref 38.5–50.0)
Hemoglobin: 13.1 g/dL — ABNORMAL LOW (ref 13.2–17.1)
MCH: 33.5 pg — ABNORMAL HIGH (ref 27.0–33.0)
MCHC: 34.1 g/dL (ref 32.0–36.0)
MCV: 98.2 fL (ref 80.0–100.0)
MPV: 10.4 fL (ref 7.5–12.5)
Platelets: 178 10*3/uL (ref 140–400)
RBC: 3.91 10*6/uL — ABNORMAL LOW (ref 4.20–5.80)
RDW: 12.6 % (ref 11.0–15.0)
WBC: 4.8 10*3/uL (ref 3.8–10.8)

## 2020-04-17 LAB — RPR: RPR Ser Ql: NONREACTIVE

## 2020-04-22 ENCOUNTER — Other Ambulatory Visit: Payer: Self-pay | Admitting: Internal Medicine

## 2020-04-22 DIAGNOSIS — B20 Human immunodeficiency virus [HIV] disease: Secondary | ICD-10-CM

## 2020-04-27 ENCOUNTER — Other Ambulatory Visit: Payer: Self-pay

## 2020-04-27 ENCOUNTER — Ambulatory Visit (INDEPENDENT_AMBULATORY_CARE_PROVIDER_SITE_OTHER): Payer: BC Managed Care – PPO

## 2020-04-27 DIAGNOSIS — Z23 Encounter for immunization: Secondary | ICD-10-CM | POA: Diagnosis not present

## 2020-04-27 NOTE — Progress Notes (Signed)
Shingles vaccine given w/o complications.

## 2020-04-28 ENCOUNTER — Encounter: Payer: BC Managed Care – PPO | Admitting: Internal Medicine

## 2020-05-13 ENCOUNTER — Other Ambulatory Visit: Payer: Self-pay | Admitting: Internal Medicine

## 2020-05-13 ENCOUNTER — Ambulatory Visit (INDEPENDENT_AMBULATORY_CARE_PROVIDER_SITE_OTHER): Payer: BC Managed Care – PPO | Admitting: Internal Medicine

## 2020-05-13 ENCOUNTER — Encounter: Payer: Self-pay | Admitting: Internal Medicine

## 2020-05-13 ENCOUNTER — Other Ambulatory Visit: Payer: Self-pay

## 2020-05-13 VITALS — BP 110/75 | HR 75 | Temp 98.7°F | Wt 234.0 lb

## 2020-05-13 DIAGNOSIS — Z23 Encounter for immunization: Secondary | ICD-10-CM | POA: Diagnosis not present

## 2020-05-13 DIAGNOSIS — B20 Human immunodeficiency virus [HIV] disease: Secondary | ICD-10-CM

## 2020-05-13 DIAGNOSIS — B181 Chronic viral hepatitis B without delta-agent: Secondary | ICD-10-CM | POA: Diagnosis not present

## 2020-05-13 MED ORDER — GENVOYA 150-150-200-10 MG PO TABS: ORAL_TABLET | ORAL | 11 refills | Status: DC

## 2020-05-13 NOTE — Assessment & Plan Note (Signed)
His infection remains under excellent, long-term control.

## 2020-05-13 NOTE — Progress Notes (Signed)
Patient Active Problem List   Diagnosis Date Noted  . Human immunodeficiency virus (HIV) disease ([place]) 07/28/2006    Priority: High  . Chronic hepatitis B virus infection ([place]) 07/28/2006    Priority: High  . Obesity 09/12/2011    Priority: Medium  . Essential hypertension 07/07/2009    Priority: Medium  . CIGARETTE SMOKER 05/08/2007    Priority: Medium  . GERD 07/28/2006    Priority: Medium  . Vasovagal near syncope 06/05/2018  . Acute upper respiratory infection 06/05/2018  . Family history of colon cancer 11/15/2017  . Chest pain 11/15/2017  . Abnormal TSH 11/15/2017  . Hyperglycemia 11/09/2016  . Cough 01/08/2016  . Wheezing 01/08/2016  . Peripheral edema 10/02/2015  . Pain of right heel 06/18/2013  . Preventative health care 05/31/2012  . Hyperlipidemia 05/31/2012  . Carpal tunnel syndrome, right 05/31/2012  . Dizziness 04/19/2011  . LOW BACK PAIN, ACUTE 05/27/2010  . GENITAL HERPES 08/22/2006  . VENEREAL WART 08/22/2006  . ANXIETY DISORDER, GENERALIZED 08/22/2006  . NEPHROLITHIASIS 08/22/2006  . PSORIASIS 08/22/2006  . DEGENERATIVE DISC DISEASE 08/22/2006  . CERVICAL RADICULOPATHY 08/22/2006  . PNEUMONIA 01/22/2006    Patient's Medications  New Prescriptions   No medications on file  Previous Medications   ALBUTEROL (VENTOLIN HFA) 108 (90 BASE) MCG/ACT INHALER    TAKE 2 PUFFS BY MOUTH EVERY 6 HOURS AS NEEDED FOR WHEEZE OR SHORTNESS OF BREATH   AMLODIPINE-OLMESARTAN (AZOR) 5-40 MG TABLET    Take 1 tablet by mouth daily.   ASPIRIN 81 MG TABLET    Take 81 mg by mouth daily.   ATORVASTATIN (LIPITOR) 20 MG TABLET    TAKE 1 TABLET (20 MG TOTAL) BY MOUTH DAILY. APPOINTMENT NEEDED FOR ADDITIONAL REFILLS   AUGMENTED BETAMETHASONE DIPROPIONATE (DIPROLENE-AF) 0.05 % CREAM    APPLY TO AFFECTED AREA UP TO TWICE A DAY AS NEEDED( NOT TO FACE,GROIN OR UNDERASRMS )   DICLOFENAC (VOLTAREN) 50 MG EC TABLET    1 tab by mouth twice per day as needed   DICLOFENAC  SODIUM (VOLTAREN) 1 % GEL    APPLY 4 G TOPICALLY 4 (FOUR) TIMES DAILY AS NEEDED.   PANTOPRAZOLE (PROTONIX) 40 MG TABLET    Take 1 tablet (40 mg total) by mouth daily.   VALACYCLOVIR (VALTREX) 500 MG TABLET    TAKE 1 TABLET BY MOUTH TWICE A DAY  Modified Medications   Modified Medication Previous Medication   ELVITEGRAVIR-COBICISTAT-EMTRICITABINE-TENOFOVIR (GENVOYA) 150-150-200-10 MG TABS TABLET GENVOYA 150-150-200-10 MG TABS tablet      TAKE 1 TABLET BY MOUTH EVERY DAY WITH BREAKFAST    TAKE 1 TABLET BY MOUTH EVERY DAY WITH BREAKFAST  Discontinued Medications   No medications on file    Subjective: Anthony Steele is in for his routine HIV follow-up visit.  He has not had any problems obtaining, taking or tolerating his Genvoya and does not recall missing doses.  He is feeling well.  He has been working steadily throughout the [employer] pandemic.  He has received his initial doses of the Pfizer vaccine.  He is not getting much exercise.  He says that he was concerned when he saw that his blood sugar was elevated on his recent blood work.  Review of Systems: Review of Systems  Constitutional: Negative for fever and weight loss.    Past Medical History:  Diagnosis Date  . ANXIETY DISORDER, GENERALIZED 08/22/2006   Qualifier: Diagnosis of  By: [name], Krista Godsil    .  Carpal tunnel syndrome, right 05/31/2012  . CERVICAL RADICULOPATHY 08/22/2006   Annotation: L arm 2002 Qualifier: Diagnosis of  By: [name], Jeaneen Cala    . DEGENERATIVE [place] DISEASE 08/22/2006   Qualifier: Diagnosis of  By: [name], Nautika Cressey    . GENITAL HERPES 08/22/2006   Qualifier: Diagnosis of  By: [name], Kaylan Friedmann    . GERD 07/28/2006   Qualifier: Diagnosis of  By: [name], Mabeline Varas    . GERD (gastroesophageal reflux disease)   . HEPATITIS B, CHRONIC 07/28/2006   Qualifier: Diagnosis of  By: [name], Kirklin Mcduffee    . HIV DISEASE 07/28/2006   Annotation: dx 2000 Qualifier: Diagnosis of  By: [name], Akshath Mccarey    . HIV infection ([place])   .  Hyperlipidemia   . Hypertension   . HYPERTENSION NEC 07/07/2009   Qualifier: Diagnosis of  By: [name], Jahden Schara    . Kidney stones   . NEPHROLITHIASIS 08/22/2006   Annotation: x2, last 1992 Qualifier: Diagnosis of  By: [name], Genese Quebedeaux    . Obesity 09/12/2011  . PSORIASIS 08/22/2006   Qualifier: Diagnosis of  By: [name], Sante Biedermann      Social History   Tobacco Use  . Smoking status: Former Smoker    Packs/day: 0.10    Types: Cigarettes    Quit date: 07/18/2012    Years since quitting: 7.8  . Smokeless tobacco: Never Used  . Tobacco comment: quit over cruise over the holidays  Substance Use Topics  . Alcohol use: Yes    Alcohol/week: 3.0 - 5.0 standard drinks    Types: 3 - 5 Standard drinks or equivalent per week    Comment: occ  . Drug use: No    Family History  Problem Relation Age of Onset  . Colon cancer Sister   . Heart disease Other   . Hypertension Other   . Arthritis Other   . Cancer Other        colon cancer  . Diabetes Maternal Aunt   . Heart failure Mother     Allergies  Allergen Reactions  . Lisinopril Rash    Health Maintenance  Topic Date Due  . INFLUENZA VACCINE  05/27/2020 (Originally 02/23/2020)  . COLONOSCOPY  04/04/2023  . TETANUS/TDAP  02/18/2029  . COVID-19 Vaccine  Completed  . Hepatitis C Screening  Completed  . HIV Screening  Completed    Objective:  Vitals:   05/13/20 0908  BP: 110/75  Pulse: 75  Temp: 98.7 F (37.1 C)  TempSrc: Oral  Weight: 234 lb (106.1 kg)   Body mass index is 31.74 kg/m.  Physical Exam Constitutional:      Comments: His spirits are good as usual.  Cardiovascular:     Rate and Rhythm: Normal rate and regular rhythm.     Heart sounds: No murmur heard.   Pulmonary:     Effort: Pulmonary effort is normal.     Breath sounds: Normal breath sounds.  Psychiatric:        Mood and Affect: Mood normal.     Lab Results Lab Results  Component Value Date   WBC 4.8 04/14/2020   HGB 13.1 (L)  04/14/2020   HCT 38.4 (L) 04/14/2020   MCV 98.2 04/14/2020   PLT 178 04/14/2020    Lab Results  Component Value Date   CREATININE 1.02 04/14/2020   BUN 26 (H) 04/14/2020   NA 137 04/14/2020   K 4.7 04/14/2020   CL 105 04/14/2020   CO2  23 04/14/2020    Lab Results  Component Value Date   ALT 32 04/14/2020   AST 38 (H) 04/14/2020   ALKPHOS 57 02/19/2019   BILITOT 0.6 04/14/2020    Lab Results  Component Value Date   CHOL 158 04/14/2020   HDL 51 04/14/2020   LDLCALC 91 04/14/2020   TRIG 72 04/14/2020   CHOLHDL 3.1 04/14/2020   Lab Results  Component Value Date   LABRPR NON-REACTIVE 04/14/2020   HIV 1 RNA Quant  Date Value  04/14/2020 <20 Copies/mL (H)  04/11/2019 <20 DETECTED copies/mL (A)  03/28/2018 <20 DETECTED copies/mL (A)   CD4 T Cell Abs (/uL)  Date Value  04/14/2020 465  03/28/2018 790  03/23/2017 560     Problem List Items Addressed This Visit      High   Human immunodeficiency virus (HIV) disease ([place])    His infection remains under excellent, long-term control.      Relevant Medications   elvitegravir-cobicistat-emtricitabine-tenofovir (GENVOYA) 150-150-200-10 MG TABS tablet   Other Relevant Orders   CBC   T-helper cell (CD4)- ([facility] clinic only)   Comprehensive metabolic panel   Lipid panel   RPR   HIV-1 RNA quant-no reflex-bld   Chronic hepatitis B virus infection ([place])   Relevant Medications   elvitegravir-cobicistat-emtricitabine-tenofovir (GENVOYA) 150-150-200-10 MG TABS tablet   Other Relevant Orders   Hepatitis B DNA, ultraquantitative, PCR   Hepatitis B e antibody   Hepatitis B e antigen        Michel Bickers, MD [facility] for [place] [phone] pager   [phone] cell 05/13/2020, 9:46 AM

## 2020-05-13 NOTE — Telephone Encounter (Signed)
Please refill as per office routine med refill policy (all routine meds refilled for 3 mo or monthly per pt preference up to one year from last visit, then month to month grace period for 3 mo, then further med refills will have to be denied)  

## 2020-05-17 ENCOUNTER — Encounter: Payer: Self-pay | Admitting: Internal Medicine

## 2020-05-18 ENCOUNTER — Other Ambulatory Visit: Payer: Self-pay

## 2020-05-18 MED ORDER — PANTOPRAZOLE SODIUM 40 MG PO TBEC
40.0000 mg | DELAYED_RELEASE_TABLET | Freq: Every day | ORAL | 3 refills | Status: DC
Start: 2020-05-18 — End: 2021-02-08

## 2020-05-19 ENCOUNTER — Encounter: Payer: Self-pay | Admitting: Internal Medicine

## 2020-05-20 ENCOUNTER — Other Ambulatory Visit: Payer: Self-pay | Admitting: Internal Medicine

## 2020-05-20 ENCOUNTER — Other Ambulatory Visit: Payer: Self-pay

## 2020-05-20 ENCOUNTER — Telehealth: Payer: Self-pay

## 2020-05-20 DIAGNOSIS — B20 Human immunodeficiency virus [HIV] disease: Secondary | ICD-10-CM

## 2020-05-20 MED ORDER — GENVOYA 150-150-200-10 MG PO TABS: ORAL_TABLET | ORAL | 11 refills | Status: DC

## 2020-05-20 MED ORDER — CONDYLOX 0.5 % EX GEL: Freq: Two times a day (BID) | CUTANEOUS | 2 refills | Status: DC

## 2020-05-20 NOTE — Telephone Encounter (Signed)
Attempted to call patient after receiving refill request from [facility]. Year supply sent to [facility] during last appointment this month.  Left vm requesting patient return call to confirm pharmacy.

## 2020-05-20 NOTE — Telephone Encounter (Signed)
Need to confirm pharmacy. One year supply [facility] to [facility]

## 2020-05-20 NOTE — Progress Notes (Signed)
Patient's insurance requires Genvoya be filled by speciality pharmacy. Will send refills to [facility] and cancel script sent to [facility].

## 2020-05-22 ENCOUNTER — Ambulatory Visit (INDEPENDENT_AMBULATORY_CARE_PROVIDER_SITE_OTHER): Payer: BC Managed Care – PPO

## 2020-05-22 ENCOUNTER — Other Ambulatory Visit: Payer: Self-pay

## 2020-05-22 DIAGNOSIS — Z23 Encounter for immunization: Secondary | ICD-10-CM

## 2020-05-22 NOTE — Progress Notes (Signed)
   Covid-19 Vaccination Clinic  Name:  Anthony Steele    MRN: [id number] DOB: March 06, 1961  05/22/2020  Mr. South was observed post Covid-19 immunization for 15 minutes without incident. He was provided with Vaccine Information Sheet and instruction to access the V-Safe system.   [name] was instructed to call 911 with any severe reactions post vaccine: Marland Kitchen Difficulty breathing  . Swelling of face and throat  . A fast heartbeat  . A bad rash all over body  . Dizziness and weakness     Zaia Carre T Brooks Sailors

## 2020-08-03 ENCOUNTER — Encounter: Payer: Self-pay | Admitting: Internal Medicine

## 2020-08-03 DIAGNOSIS — M25519 Pain in unspecified shoulder: Secondary | ICD-10-CM

## 2020-08-11 ENCOUNTER — Other Ambulatory Visit: Payer: BC Managed Care – PPO

## 2020-08-18 DIAGNOSIS — M25511 Pain in right shoulder: Secondary | ICD-10-CM | POA: Diagnosis not present

## 2020-08-28 ENCOUNTER — Encounter: Payer: Self-pay | Admitting: Internal Medicine

## 2020-12-07 ENCOUNTER — Other Ambulatory Visit: Payer: Self-pay | Admitting: Internal Medicine

## 2020-12-22 ENCOUNTER — Telehealth: Payer: Self-pay

## 2020-12-22 ENCOUNTER — Other Ambulatory Visit (HOSPITAL_COMMUNITY): Payer: Self-pay

## 2020-12-22 NOTE — Telephone Encounter (Signed)
[facility] Patient Advocate Encounter  Received notification from Patient Marvin that patient has been denied enrollment into their program to receive Genvoya from the drug manufacturer.      Patient household Income exceeds the guidelines of the program   Dovato $2676.04 Phillips Odor $1601.09 Jules Husbands $3235.57 Hollace Kinnier (Generic) [phone] Prezocobix [phone]    Patient knows to call the office with questions or concerns.   [facility] will continue to follow.  Ileene Patrick, [facility] Patient [facility] for Infectious Disease Phone: [phone] Fax:  [phone]

## 2020-12-28 ENCOUNTER — Other Ambulatory Visit: Payer: Self-pay | Admitting: Internal Medicine

## 2020-12-28 DIAGNOSIS — A6000 Herpesviral infection of urogenital system, unspecified: Secondary | ICD-10-CM

## 2021-01-05 ENCOUNTER — Other Ambulatory Visit: Payer: Self-pay | Admitting: Pharmacist

## 2021-01-05 ENCOUNTER — Other Ambulatory Visit: Payer: Self-pay | Admitting: Internal Medicine

## 2021-01-05 ENCOUNTER — Other Ambulatory Visit (HOSPITAL_COMMUNITY): Payer: Self-pay

## 2021-01-05 DIAGNOSIS — B20 Human immunodeficiency virus [HIV] disease: Secondary | ICD-10-CM

## 2021-01-05 MED ORDER — SYMTUZA 800-150-200-10 MG PO TABS: 1.0000 | ORAL_TABLET | Freq: Every day | ORAL | 11 refills | Status: DC

## 2021-01-05 NOTE — Progress Notes (Signed)
Patient's copay card has ran out for [facility]. He is also over income limit for PAF assistance. Will change him to [facility] as it is a different manufacturer so his copay card can start over. He needs better insurance.

## 2021-01-05 NOTE — Telephone Encounter (Signed)
Please refill as per office routine med refill policy (all routine meds refilled for 3 mo or monthly per pt preference up to one year from last visit, then month to month grace period for 3 mo, then further med refills will have to be denied)  

## 2021-01-06 ENCOUNTER — Telehealth: Payer: Self-pay

## 2021-01-06 NOTE — Telephone Encounter (Signed)
[facility] Patient Advocate Encounter   Was successful in obtaining a [employer] copay card for [employer].  This copay card will make the patients copay 0.00.  I have spoken with the patient.         Anthony Steele, [facility] Patient [facility] for Infectious Disease Phone: [phone] Fax:  [phone]

## 2021-01-11 ENCOUNTER — Telehealth: Payer: Self-pay

## 2021-01-11 NOTE — Telephone Encounter (Signed)
Medication Samples have been provided to the patient.  Drug name: Symtuza        Strength: 800/150/200/10 mg Qty: 30  LOT: [id number]   Exp.Date: 06/24  Dosing instructions: Take one tablet by mouth once daily with food    Anthony Steele, [place] Patient [facility] for Infectious Disease Phone: [phone] Fax:  [phone]

## 2021-02-01 ENCOUNTER — Encounter: Payer: Self-pay | Admitting: Internal Medicine

## 2021-02-01 DIAGNOSIS — M5440 Lumbago with sciatica, unspecified side: Secondary | ICD-10-CM

## 2021-02-03 NOTE — Telephone Encounter (Signed)
PCC's to see pt preference please

## 2021-02-06 ENCOUNTER — Encounter: Payer: Self-pay | Admitting: Internal Medicine

## 2021-02-06 DIAGNOSIS — A6000 Herpesviral infection of urogenital system, unspecified: Secondary | ICD-10-CM

## 2021-02-08 ENCOUNTER — Ambulatory Visit (INDEPENDENT_AMBULATORY_CARE_PROVIDER_SITE_OTHER): Payer: No Typology Code available for payment source | Admitting: Family Medicine

## 2021-02-08 ENCOUNTER — Encounter: Payer: Self-pay | Admitting: Family Medicine

## 2021-02-08 ENCOUNTER — Other Ambulatory Visit: Payer: Self-pay

## 2021-02-08 DIAGNOSIS — M545 Low back pain, unspecified: Secondary | ICD-10-CM | POA: Diagnosis not present

## 2021-02-08 MED ORDER — PANTOPRAZOLE SODIUM 40 MG PO TBEC: 40.0000 mg | DELAYED_RELEASE_TABLET | Freq: Every day | ORAL | 3 refills | Status: DC

## 2021-02-08 MED ORDER — DICLOFENAC SODIUM 50 MG PO TBEC
DELAYED_RELEASE_TABLET | ORAL | 3 refills | Status: DC
Start: 2021-02-08 — End: 2022-04-05

## 2021-02-08 MED ORDER — VALACYCLOVIR HCL 500 MG PO TABS: 500.0000 mg | ORAL_TABLET | Freq: Two times a day (BID) | ORAL | 3 refills | Status: DC

## 2021-02-08 MED ORDER — ATORVASTATIN CALCIUM 20 MG PO TABS
20.0000 mg | ORAL_TABLET | Freq: Every day | ORAL | 3 refills | Status: DC
Start: 2021-02-08 — End: 2022-01-24

## 2021-02-08 NOTE — Progress Notes (Signed)
Office Visit Note   Patient: Anthony Steele           Date of Birth: 1960-12-11           MRN: [id number] Visit Date: 02/08/2021 Requested by: Biagio Borg, MD [address] [place],  [place] [address] PCP: Biagio Borg, MD  Subjective: Chief Complaint  Patient presents with   Lower Back - Pain    Pain across the lower back into the right buttock, with shooting pains down the right leg. Spasms in the buttock region, mainly on the right.Feels "unstable" while standing.  Has had this 2-3 weeks. This started after a hiking trip, but he does not recall a specific incident.    HPI: Here with LBP and right leg weakness.  Symptoms for several weeks, no definite injury.  Was doing some dead lifts and some hiking around the time of onset.  Now can't walk long without legs feeling like they're giving out, especially the right.  No urinary complaints.                ROS: No numbness.  All other systems were reviewed and are negative.  Objective: Vital Signs: There were no vitals taken for this visit.  Physical Exam:  General:  Alert and oriented, in no acute distress. Pulm:  Breathing unlabored. Psy:  Normal mood, congruent affect. Skin:   No rash.  LB:  No tenderness to palpation.  Stork test and SLR are negative.  LE strength normal except bilateral weakness with ankle dorsiflexion.  Absent right Achilles DTR, normal left.  Normal patella DTRs.   Imaging: No results found.  Assessment & Plan:  LBP with bilateral ankle dorsiflexion weakness, concerning for HNP. - HEP and PT.  Did not want meds.  X-Rays and MRI if fails to improve.     Procedures: No procedures performed        PMFS History: Patient Active Problem List   Diagnosis Date Noted   Vasovagal near syncope 06/05/2018   Acute upper respiratory infection 06/05/2018   Family history of colon cancer 11/15/2017   Chest pain 11/15/2017   Abnormal TSH 11/15/2017   Hyperglycemia 11/09/2016   Cough 01/08/2016    Wheezing 01/08/2016   Peripheral edema 10/02/2015   Pain of right heel 06/18/2013   Preventative health care 05/31/2012   Hyperlipidemia 05/31/2012   Carpal tunnel syndrome, right 05/31/2012   Obesity 09/12/2011   Dizziness 04/19/2011   LOW BACK PAIN, ACUTE 05/27/2010   Essential hypertension 07/07/2009   CIGARETTE SMOKER 05/08/2007   GENITAL HERPES 08/22/2006   VENEREAL WART 08/22/2006   ANXIETY DISORDER, GENERALIZED 08/22/2006   NEPHROLITHIASIS 08/22/2006   PSORIASIS 08/22/2006   DEGENERATIVE [place] DISEASE 08/22/2006   CERVICAL RADICULOPATHY 08/22/2006   Human immunodeficiency virus (HIV) disease ([place]) 07/28/2006   Chronic hepatitis B virus infection ([place]) 07/28/2006   GERD 07/28/2006   PNEUMONIA 01/22/2006   Past Medical History:  Diagnosis Date   ANXIETY DISORDER, GENERALIZED 08/22/2006   Qualifier: Diagnosis of  By: [name], [name]     Carpal tunnel syndrome, right 05/31/2012   CERVICAL RADICULOPATHY 08/22/2006   Annotation: L arm 2002 Qualifier: Diagnosis of  By: [name], [place] DISEASE 08/22/2006   Qualifier: Diagnosis of  By: [name], [place]     GENITAL HERPES 08/22/2006   Qualifier: Diagnosis of  By: [name], [name]     GERD 07/28/2006   Qualifier: Diagnosis of  By: [name],  [name]     GERD (gastroesophageal reflux disease)    HEPATITIS B, CHRONIC 07/28/2006   Qualifier: Diagnosis of  By: [name], [name]     HIV DISEASE 07/28/2006   Annotation: dx 2000 Qualifier: Diagnosis of  By: [name], [name]     HIV infection [facility])    Hyperlipidemia    Hypertension    HYPERTENSION NEC 07/07/2009   Qualifier: Diagnosis of  By: [name], [name]     Kidney stones    NEPHROLITHIASIS 08/22/2006   Annotation: x2, last 1992 Qualifier: Diagnosis of  By: [name], [name]     Obesity 09/12/2011   PSORIASIS 08/22/2006   Qualifier: Diagnosis of  By: [name], [name]      Family History  Problem Relation Age of Onset   Colon cancer Sister    Heart  disease Other    Hypertension Other    Arthritis Other    Cancer Other        colon cancer   Diabetes Maternal Aunt    Heart failure Mother     Past Surgical History:  Procedure Laterality Date   COLONOSCOPY     CYSTECTOMY  2000   cyst removed off buttock cheek   Social History   Occupational History   Not on file  Tobacco Use   Smoking status: Former    Packs/day: 0.10    Types: Cigarettes    Quit date: 07/18/2012    Years since quitting: 8.5   Smokeless tobacco: Never   Tobacco comments:    quit over cruise over the holidays  Substance and Sexual Activity   Alcohol use: Yes    Alcohol/week: 3.0 - 5.0 standard drinks    Types: 3 - 5 Standard drinks or equivalent per week    Comment: occ   Drug use: No   Sexual activity: Not Currently    Comment: decline condoms

## 2021-03-23 ENCOUNTER — Encounter: Payer: Self-pay | Admitting: Internal Medicine

## 2021-03-23 ENCOUNTER — Ambulatory Visit (INDEPENDENT_AMBULATORY_CARE_PROVIDER_SITE_OTHER): Payer: No Typology Code available for payment source | Admitting: Internal Medicine

## 2021-03-23 ENCOUNTER — Other Ambulatory Visit: Payer: Self-pay

## 2021-03-23 VITALS — BP 128/76 | HR 67 | Temp 98.7°F | Ht 72.0 in | Wt 214.0 lb

## 2021-03-23 DIAGNOSIS — Z0001 Encounter for general adult medical examination with abnormal findings: Secondary | ICD-10-CM | POA: Diagnosis not present

## 2021-03-23 DIAGNOSIS — I10 Essential (primary) hypertension: Secondary | ICD-10-CM

## 2021-03-23 DIAGNOSIS — E538 Deficiency of other specified B group vitamins: Secondary | ICD-10-CM

## 2021-03-23 DIAGNOSIS — R739 Hyperglycemia, unspecified: Secondary | ICD-10-CM

## 2021-03-23 DIAGNOSIS — E559 Vitamin D deficiency, unspecified: Secondary | ICD-10-CM | POA: Diagnosis not present

## 2021-03-23 DIAGNOSIS — E7800 Pure hypercholesterolemia, unspecified: Secondary | ICD-10-CM | POA: Diagnosis not present

## 2021-03-23 DIAGNOSIS — Z23 Encounter for immunization: Secondary | ICD-10-CM | POA: Diagnosis not present

## 2021-03-23 LAB — URINALYSIS, ROUTINE W REFLEX MICROSCOPIC
Bilirubin Urine: NEGATIVE
Hgb urine dipstick: NEGATIVE
Leukocytes,Ua: NEGATIVE
Nitrite: NEGATIVE
RBC / HPF: NONE SEEN (ref 0–?)
Specific Gravity, Urine: 1.025 (ref 1.000–1.030)
Total Protein, Urine: NEGATIVE
Urine Glucose: NEGATIVE
Urobilinogen, UA: 1 (ref 0.0–1.0)
pH: 6 (ref 5.0–8.0)

## 2021-03-23 LAB — CBC WITH DIFFERENTIAL/PLATELET
Basophils Absolute: 0 10*3/uL (ref 0.0–0.1)
Basophils Relative: 0.6 % (ref 0.0–3.0)
Eosinophils Absolute: 0 10*3/uL (ref 0.0–0.7)
Eosinophils Relative: 0.7 % (ref 0.0–5.0)
HCT: 40.6 % (ref 39.0–52.0)
Hemoglobin: 13.4 g/dL (ref 13.0–17.0)
Lymphocytes Relative: 27.5 % (ref 12.0–46.0)
Lymphs Abs: 1.7 10*3/uL (ref 0.7–4.0)
MCHC: 33.1 g/dL (ref 30.0–36.0)
MCV: 96.9 fl (ref 78.0–100.0)
Monocytes Absolute: 0.5 10*3/uL (ref 0.1–1.0)
Monocytes Relative: 8.3 % (ref 3.0–12.0)
Neutro Abs: 3.8 10*3/uL (ref 1.4–7.7)
Neutrophils Relative %: 62.9 % (ref 43.0–77.0)
Platelets: 168 10*3/uL (ref 150.0–400.0)
RBC: 4.19 Mil/uL — ABNORMAL LOW (ref 4.22–5.81)
RDW: 14 % (ref 11.5–15.5)
WBC: 6 10*3/uL (ref 4.0–10.5)

## 2021-03-23 LAB — LIPID PANEL
Cholesterol: 136 mg/dL (ref 0–200)
HDL: 51.5 mg/dL (ref >39.00)
LDL Cholesterol: 70 mg/dL (ref 0–99)
NonHDL: 84.26
Total CHOL/HDL Ratio: 3
Triglycerides: 71 mg/dL (ref 0.0–149.0)
VLDL: 14.2 mg/dL (ref 0.0–40.0)

## 2021-03-23 LAB — HEPATIC FUNCTION PANEL
ALT: 55 U/L — ABNORMAL HIGH (ref 0–53)
AST: 60 U/L — ABNORMAL HIGH (ref 0–37)
Albumin: 4.4 g/dL (ref 3.5–5.2)
Alkaline Phosphatase: 60 U/L (ref 39–117)
Bilirubin, Direct: 0.1 mg/dL (ref 0.0–0.3)
Total Bilirubin: 0.5 mg/dL (ref 0.2–1.2)
Total Protein: 6.8 g/dL (ref 6.0–8.3)

## 2021-03-23 LAB — BASIC METABOLIC PANEL
BUN: 29 mg/dL — ABNORMAL HIGH (ref 6–23)
CO2: 28 mEq/L (ref 19–32)
Calcium: 9.8 mg/dL (ref 8.4–10.5)
Chloride: 104 mEq/L (ref 96–112)
Creatinine, Ser: 1 mg/dL (ref 0.40–1.50)
GFR: 82.73 mL/min (ref >60.00)
Glucose, Bld: 91 mg/dL (ref 70–99)
Potassium: 4.9 mEq/L (ref 3.5–5.1)
Sodium: 139 mEq/L (ref 135–145)

## 2021-03-23 LAB — VITAMIN D 25 HYDROXY (VIT D DEFICIENCY, FRACTURES): VITD: 33.66 ng/mL (ref 30.00–100.00)

## 2021-03-23 LAB — PSA: PSA: 0.75 ng/mL (ref 0.10–4.00)

## 2021-03-23 LAB — TSH: TSH: 3.24 u[IU]/mL (ref 0.35–5.50)

## 2021-03-23 LAB — HEMOGLOBIN A1C: Hgb A1c MFr Bld: 5.5 % (ref 4.6–6.5)

## 2021-03-23 LAB — VITAMIN B12: Vitamin B-12: 382 pg/mL (ref 211–911)

## 2021-03-23 NOTE — Progress Notes (Signed)
Patient ID: Anthony Steele, male   DOB: 06/02/1961, 60 y.o.   MRN: [id number]         Chief Complaint:: wellness exam and htn, hyperglycemia, hld       HPI:  Anthony Steele is a 60 y.o. male here for wellness exam, up to date with preventive referrals and immunizations                        Also s/p monkeypox vax x 1 so far, due fo #2 soon.  Has been not taking BP meds for 2 mo, more or less acicdetnly, but lost wt intentionally with lot of gym work.  Peak wt has been about 238 (not 273 as documented).  Pt denies chest pain, increased sob or doe, wheezing, orthopnea, PND, increased LE swelling, palpitations, dizziness or syncope.   Pt denies polydipsia, polyuria, or new focal neuro s/s.   Wt Readings from Last 3 Encounters:  03/23/21 214 lb (97.1 kg)  05/13/20 234 lb (106.1 kg)  02/25/20 273 lb (123.8 kg)   BP Readings from Last 3 Encounters:  03/23/21 128/76  05/13/20 110/75  02/25/20 110/68   Immunization History  Administered Date(s) Administered   Influenza Split 04/19/2011, 05/31/2012   Influenza Whole 08/22/2006, 05/08/2007, 06/24/2008, 05/27/2010   Influenza,inj,Quad PF,6+ Mos 03/26/2013, 05/13/2014, 06/29/2015, 05/25/2016, 04/06/2017, 04/11/2018, 05/13/2020, 03/23/2021   PFIZER(Purple Top)SARS-COV-2 Vaccination 09/28/2019, 10/26/2019, 05/22/2020, 12/04/2020   Pneumococcal Conjugate-13 06/18/2013   Pneumococcal Polysaccharide-23 08/21/2006, 09/12/2011   Tdap 05/31/2012, 02/19/2019   Vaccinia,smallpox Monkeypox Vaccine Live,pf 03/08/2021   Zoster Recombinat (Shingrix) 02/25/2020, 04/27/2020   There are no preventive care reminders to display for this patient.     Past Medical History:  Diagnosis Date   ANXIETY DISORDER, GENERALIZED 08/22/2006   Qualifier: Diagnosis of  By: [name], Shelle Galdamez     Carpal tunnel syndrome, right 05/31/2012   CERVICAL RADICULOPATHY 08/22/2006   Annotation: L arm 2002 Qualifier: Diagnosis of  By: [name], [place] DISEASE  08/22/2006   Qualifier: Diagnosis of  By: [name], Debbrah Sampedro     GENITAL HERPES 08/22/2006   Qualifier: Diagnosis of  By: [name], Nathali Vent     GERD 07/28/2006   Qualifier: Diagnosis of  By: [name], Alaena Strader     GERD (gastroesophageal reflux disease)    HEPATITIS B, CHRONIC 07/28/2006   Qualifier: Diagnosis of  By: [name], Jacelyn Cuen     HIV DISEASE 07/28/2006   Annotation: dx 2000 Qualifier: Diagnosis of  By: [name], Amberrose Friebel     HIV infection [facility])    Hyperlipidemia    Hypertension    HYPERTENSION NEC 07/07/2009   Qualifier: Diagnosis of  By: [name], Lissette Schenk     Kidney stones    NEPHROLITHIASIS 08/22/2006   Annotation: x2, last 1992 Qualifier: Diagnosis of  By: [name], Sasha Rueth     Obesity 09/12/2011   PSORIASIS 08/22/2006   Qualifier: Diagnosis of  By: [name], Gerell Fortson     Past Surgical History:  Procedure Laterality Date   COLONOSCOPY     CYSTECTOMY  2000   cyst removed off buttock cheek    reports that he quit smoking about 8 years ago. His smoking use included cigarettes. He smoked an average of .1 packs per day. He has never used smokeless tobacco. He reports current alcohol use of about 3.0 - 5.0 standard drinks per week. He reports that he does not use drugs. family history includes Arthritis  in an other family member; Cancer in an other family member; Colon cancer in his sister; Diabetes in his maternal aunt; Heart disease in an other family member; Heart failure in his mother; Hypertension in an other family member. Allergies  Allergen Reactions   Lisinopril Rash   Current Outpatient Medications on File Prior to Visit  Medication Sig Dispense Refill   aspirin 81 MG tablet Take 81 mg by mouth daily.     atorvastatin (LIPITOR) 20 MG tablet Take 1 tablet (20 mg total) by mouth daily. APPOINTMENT NEEDED FOR ADDITIONAL REFILLS 90 tablet 3   augmented betamethasone dipropionate (DIPROLENE-AF) 0.05 % cream APPLY TO AFFECTED AREA UP TO TWICE A DAY AS NEEDED( NOT TO FACE,GROIN OR  UNDERASRMS )     Darunavir-Cobicisctat-Emtricitabine-Tenofovir Alafenamide (SYMTUZA) 800-150-200-10 MG TABS Take 1 tablet by mouth daily with breakfast. 30 tablet 11   diclofenac (VOLTAREN) 50 MG EC tablet TAKE 1 TABLET BY MOUTH TWICE A DAY AS NEEDED 180 tablet 3   diclofenac Sodium (VOLTAREN) 1 % GEL APPLY 4 G TOPICALLY 4 (FOUR) TIMES DAILY AS NEEDED. 400 g 3   pantoprazole (PROTONIX) 40 MG tablet Take 1 tablet (40 mg total) by mouth daily. 90 tablet 3   podofilox (CONDYLOX) 0.5 % gel Apply topically 2 (two) times daily. 3.5 g 2   valACYclovir (VALTREX) 500 MG tablet Take 1 tablet (500 mg total) by mouth 2 (two) times daily. 180 tablet 3   amLODipine-olmesartan (AZOR) 5-40 MG tablet Take 1 tablet by mouth daily. (Patient not taking: Reported on 03/23/2021) 90 tablet 3   No current facility-administered medications on file prior to visit.        ROS:  All others reviewed and negative.  Objective        PE:  BP 128/76 (BP Location: Left Arm, Patient Position: Sitting, Cuff Size: Large)   Pulse 67   Temp 98.7 F (37.1 C) (Oral)   Ht 6' (1.829 m)   Wt 214 lb (97.1 kg)   SpO2 98%   BMI 29.02 kg/m                 Constitutional: Pt appears in NAD               HENT: Head: NCAT.                Right Ear: External ear normal.                 Left Ear: External ear normal.                Eyes: . Pupils are equal, round, and reactive to light. Conjunctivae and EOM are normal               Nose: without d/c or deformity               Neck: Neck supple. Gross normal ROM               Cardiovascular: Normal rate and regular rhythm.                 Pulmonary/Chest: Effort normal and breath sounds without rales or wheezing.                Abd:  Soft, NT, ND, + BS, no organomegaly               Neurological: Pt is alert. At baseline orientation, motor grossly intact  Skin: Skin is warm. No rashes, no other new lesions, LE edema - none               Psychiatric: Pt behavior is normal  without agitation   Micro: none  Cardiac tracings I have personally interpreted today:  none  Pertinent Radiological findings (summarize): none   Lab Results  Component Value Date   WBC 6.0 03/23/2021   HGB 13.4 03/23/2021   HCT 40.6 03/23/2021   PLT 168.0 03/23/2021   GLUCOSE 91 03/23/2021   CHOL 136 03/23/2021   TRIG 71.0 03/23/2021   HDL 51.50 03/23/2021   LDLCALC 70 03/23/2021   ALT 55 (H) 03/23/2021   AST 60 (H) 03/23/2021   NA 139 03/23/2021   K 4.9 03/23/2021   CL 104 03/23/2021   CREATININE 1.00 03/23/2021   BUN 29 (H) 03/23/2021   CO2 28 03/23/2021   TSH 3.24 03/23/2021   PSA 0.75 03/23/2021   HGBA1C 5.5 03/23/2021   Assessment/Plan:  Durelle Holms is a 60 y.o. White or Caucasian [1] male with  has a past medical history of ANXIETY DISORDER, GENERALIZED (08/22/2006), Carpal tunnel syndrome, right (05/31/2012), CERVICAL RADICULOPATHY (08/22/2006), DEGENERATIVE DISC DISEASE (08/22/2006), GENITAL HERPES (08/22/2006), GERD (07/28/2006), GERD (gastroesophageal reflux disease), HEPATITIS B, CHRONIC (07/28/2006), HIV DISEASE (07/28/2006), HIV infection ([place]), Hyperlipidemia, Hypertension, HYPERTENSION NEC (07/07/2009), Kidney stones, NEPHROLITHIASIS (08/22/2006), Obesity (09/12/2011), and PSORIASIS (08/22/2006).  Encounter for well adult exam with abnormal findings Age and sex appropriate education and counseling updated with regular exercise and diet Referrals for preventative services - none needed Immunizations addressed - none needed Smoking counseling  - none needed Evidence for depression or other mood disorder - none significant Most recent labs reviewed. I have personally reviewed and have noted: 1) the patient's medical and social history 2) The patient's current medications and supplements 3) The patient's height, weight, and BMI have been recorded in the chart   Hyperlipidemia Lab Results  Component Value Date   [place] 70 03/23/2021   Uncontrolled, goal ldl < 70,  pt to continue current statin lipitor 20 as declines further change  Hyperglycemia Lab Results  Component Value Date   HGBA1C 5.5 03/23/2021   Stable, pt to continue current medical treatment  - diet   Essential hypertension BP Readings from Last 3 Encounters:  03/23/21 128/76  05/13/20 110/75  02/25/20 110/68   overcontrolled now improved now off azor, pt to continue off med for now as long as can maintain wt loss  Vitamin D deficiency Last vitamin D Lab Results  Component Value Date   VD25OH 33.66 03/23/2021   Low, to start oral replacement  Followup: Return in about 1 year (around 03/23/2022).  Cathlean Cower, MD 03/27/2021 8:58 PM [facility]

## 2021-03-23 NOTE — Patient Instructions (Addendum)
You had the flu shot today  Ok to stay off the [place] for now  Please continue all other medications as before, and refills have been done if requested.  Please have the pharmacy call with any other refills you may need.  Please continue your efforts at being more active, low cholesterol diet, and weight control.  You are otherwise up to date with prevention measures today.  Please keep your appointments with your specialists as you may have planned  Please go to the LAB at the blood drawing area for the tests to be done  You will be contacted by phone if any changes need to be made immediately.  Otherwise, you will receive a letter about your results with an explanation, but please check with MyChart first.  Please remember to sign up for MyChart if you have not done so, as this will be important to you in the future with finding out test results, communicating by private email, and scheduling acute appointments online when needed.  Please make an Appointment to return for your 1 year visit, or sooner if needed, with Lab testing by Appointment as well, to be done about 3-5 days before at the [facility] (so this is for TWO appointments - please see the scheduling desk as you leave)   Due to the ongoing Covid 19 pandemic, our lab now requires an appointment for any labs done at our office.  If you need labs done and do not have an appointment, please call our office ahead of time to schedule before presenting to the lab for your testing.

## 2021-03-27 ENCOUNTER — Encounter: Payer: Self-pay | Admitting: Internal Medicine

## 2021-03-27 DIAGNOSIS — E559 Vitamin D deficiency, unspecified: Secondary | ICD-10-CM | POA: Insufficient documentation

## 2021-03-27 NOTE — Assessment & Plan Note (Signed)

## 2021-03-27 NOTE — Assessment & Plan Note (Signed)
Lab Results  Component Value Date   LDLCALC 70 03/23/2021   Uncontrolled, goal ldl < 70, pt to continue current statin lipitor 20 as declines further change

## 2021-03-27 NOTE — Assessment & Plan Note (Signed)
BP Readings from Last 3 Encounters:  03/23/21 128/76  05/13/20 110/75  02/25/20 110/68   overcontrolled now improved now off azor, pt to continue off med for now as long as can maintain wt loss

## 2021-03-27 NOTE — Assessment & Plan Note (Signed)
Last vitamin D Lab Results  Component Value Date   VD25OH 33.66 03/23/2021   Low, to start oral replacement

## 2021-03-27 NOTE — Assessment & Plan Note (Signed)
Lab Results  Component Value Date   HGBA1C 5.5 03/23/2021   Stable, pt to continue current medical treatment  - diet

## 2021-04-05 ENCOUNTER — Other Ambulatory Visit: Payer: Self-pay | Admitting: Pharmacist

## 2021-04-05 ENCOUNTER — Other Ambulatory Visit (HOSPITAL_COMMUNITY): Payer: Self-pay

## 2021-04-05 DIAGNOSIS — B20 Human immunodeficiency virus [HIV] disease: Secondary | ICD-10-CM

## 2021-04-05 MED ORDER — SYMTUZA 800-150-200-10 MG PO TABS
1.0000 | ORAL_TABLET | Freq: Every day | ORAL | 9 refills | Status: DC
  Filled 2021-04-05: qty 30, 30d supply, fill #0
  Filled 2021-05-04: qty 30, 30d supply, fill #1

## 2021-04-06 ENCOUNTER — Other Ambulatory Visit (HOSPITAL_COMMUNITY): Payer: Self-pay

## 2021-05-03 ENCOUNTER — Other Ambulatory Visit (HOSPITAL_COMMUNITY): Payer: Self-pay

## 2021-05-04 ENCOUNTER — Other Ambulatory Visit: Payer: No Typology Code available for payment source

## 2021-05-04 ENCOUNTER — Other Ambulatory Visit (HOSPITAL_COMMUNITY): Payer: Self-pay

## 2021-05-04 ENCOUNTER — Other Ambulatory Visit: Payer: Self-pay

## 2021-05-04 DIAGNOSIS — B20 Human immunodeficiency virus [HIV] disease: Secondary | ICD-10-CM

## 2021-05-04 DIAGNOSIS — B181 Chronic viral hepatitis B without delta-agent: Secondary | ICD-10-CM

## 2021-05-05 ENCOUNTER — Other Ambulatory Visit (HOSPITAL_COMMUNITY): Payer: Self-pay

## 2021-05-05 LAB — T-HELPER CELL (CD4) - (RCID CLINIC ONLY)
CD4 % Helper T Cell: 29 % — ABNORMAL LOW (ref 33–65)
CD4 T Cell Abs: 487 /uL (ref 400–1790)

## 2021-05-06 LAB — LIPID PANEL
Cholesterol: 152 mg/dL (ref <200)
HDL: 59 mg/dL (ref >=40)
LDL Cholesterol (Calc): 81 mg/dL (calc)
Non-HDL Cholesterol (Calc): 93 mg/dL (calc) (ref <130)
Total CHOL/HDL Ratio: 2.6 (calc) (ref <5.0)
Triglycerides: 42 mg/dL (ref <150)

## 2021-05-06 LAB — CBC
HCT: 40.6 % (ref 38.5–50.0)
Hemoglobin: 13.8 g/dL (ref 13.2–17.1)
MCH: 32.7 pg (ref 27.0–33.0)
MCHC: 34 g/dL (ref 32.0–36.0)
MCV: 96.2 fL (ref 80.0–100.0)
MPV: 10.1 fL (ref 7.5–12.5)
Platelets: 175 10*3/uL (ref 140–400)
RBC: 4.22 10*6/uL (ref 4.20–5.80)
RDW: 13.3 % (ref 11.0–15.0)
WBC: 7.2 10*3/uL (ref 3.8–10.8)

## 2021-05-06 LAB — COMPREHENSIVE METABOLIC PANEL
AG Ratio: 2 (calc) (ref 1.0–2.5)
ALT: 46 U/L (ref 9–46)
AST: 55 U/L — ABNORMAL HIGH (ref 10–35)
Albumin: 4.6 g/dL (ref 3.6–5.1)
Alkaline phosphatase (APISO): 68 U/L (ref 35–144)
BUN: 21 mg/dL (ref 7–25)
CO2: 30 mmol/L (ref 20–32)
Calcium: 9.7 mg/dL (ref 8.6–10.3)
Chloride: 102 mmol/L (ref 98–110)
Creat: 0.87 mg/dL (ref 0.70–1.30)
Globulin: 2.3 g/dL (calc) (ref 1.9–3.7)
Glucose, Bld: 99 mg/dL (ref 65–99)
Potassium: 4.8 mmol/L (ref 3.5–5.3)
Sodium: 140 mmol/L (ref 135–146)
Total Bilirubin: 0.7 mg/dL (ref 0.2–1.2)
Total Protein: 6.9 g/dL (ref 6.1–8.1)

## 2021-05-06 LAB — HIV-1 RNA QUANT-NO REFLEX-BLD
HIV 1 RNA Quant: NOT DETECTED Copies/mL
HIV-1 RNA Quant, Log: NOT DETECTED Log cps/mL

## 2021-05-06 LAB — HEPATITIS B E ANTIBODY: Hep B E Ab: NONREACTIVE

## 2021-05-06 LAB — HEPATITIS B DNA, ULTRAQUANTITATIVE, PCR
Hepatitis B DNA (Calc): <1 Log IU/mL — ABNORMAL HIGH
Hepatitis B DNA: <10 IU/mL — ABNORMAL HIGH

## 2021-05-06 LAB — RPR: RPR Ser Ql: NONREACTIVE

## 2021-05-06 LAB — HEPATITIS B E ANTIGEN: Hep B E Ag: NONREACTIVE

## 2021-05-07 ENCOUNTER — Other Ambulatory Visit (HOSPITAL_COMMUNITY): Payer: Self-pay

## 2021-05-10 ENCOUNTER — Other Ambulatory Visit (HOSPITAL_COMMUNITY): Payer: Self-pay

## 2021-05-13 ENCOUNTER — Other Ambulatory Visit: Payer: Self-pay | Admitting: Pharmacist

## 2021-05-13 DIAGNOSIS — B20 Human immunodeficiency virus [HIV] disease: Secondary | ICD-10-CM

## 2021-05-13 MED ORDER — SYMTUZA 800-150-200-10 MG PO TABS: 1.0000 | ORAL_TABLET | Freq: Every day | ORAL | 0 refills | Status: DC

## 2021-05-13 NOTE — Progress Notes (Signed)
Medication Samples have been provided to the patient.  Drug name: Symtuza        Strength: 800/150/200/10 mg Qty: 1 bottle (30 tablets)  LOT: [id number]   Exp.Date: 12/24/2022  Dosing instructions: Take one tablet by mouth once daily with food  The patient has been instructed regarding the correct time, dose, and frequency of taking this medication, including desired effects and most common side effects.   Brenlee Koskela L. Eber Hong, PharmD, BCIDP, AAHIVP, CPP Clinical Pharmacist Practitioner Infectious Diseases [place] [facility] 07/06/2020, 10:07 AM

## 2021-05-18 ENCOUNTER — Ambulatory Visit (INDEPENDENT_AMBULATORY_CARE_PROVIDER_SITE_OTHER): Payer: No Typology Code available for payment source | Admitting: Internal Medicine

## 2021-05-18 ENCOUNTER — Ambulatory Visit (INDEPENDENT_AMBULATORY_CARE_PROVIDER_SITE_OTHER): Payer: No Typology Code available for payment source

## 2021-05-18 ENCOUNTER — Other Ambulatory Visit: Payer: Self-pay

## 2021-05-18 DIAGNOSIS — Z23 Encounter for immunization: Secondary | ICD-10-CM

## 2021-05-18 DIAGNOSIS — B181 Chronic viral hepatitis B without delta-agent: Secondary | ICD-10-CM | POA: Diagnosis not present

## 2021-05-18 DIAGNOSIS — I872 Venous insufficiency (chronic) (peripheral): Secondary | ICD-10-CM | POA: Diagnosis not present

## 2021-05-18 DIAGNOSIS — B20 Human immunodeficiency virus [HIV] disease: Secondary | ICD-10-CM | POA: Diagnosis not present

## 2021-05-18 MED ORDER — SYMTUZA 800-150-200-10 MG PO TABS: 1.0000 | ORAL_TABLET | Freq: Every day | ORAL | 11 refills | Status: DC

## 2021-05-18 NOTE — Assessment & Plan Note (Signed)
His hepatitis B infection remains under excellent, long-term control and his e antigen has converted to nonreactive.  He will continue Symtuza and follow-up after repeat blood work including hepatitis B surface antibody in 1 year.

## 2021-05-18 NOTE — Progress Notes (Signed)
Patient Active Problem List   Diagnosis Date Noted   Human immunodeficiency virus (HIV) disease ([place]) 07/28/2006    Priority: 1.   Chronic hepatitis B virus infection ([place]) 07/28/2006    Priority: 1.   Obesity 09/12/2011    Priority: 2.   Essential hypertension 07/07/2009    Priority: 2.   Former smoker 05/08/2007    Priority: 2.   GERD 07/28/2006    Priority: 2.   Venous insufficiency of both lower extremities 05/18/2021   Vitamin D deficiency 03/27/2021   Vasovagal near syncope 06/05/2018   Acute upper respiratory infection 06/05/2018   Family history of colon cancer 11/15/2017   Chest pain 11/15/2017   Abnormal TSH 11/15/2017   Hyperglycemia 11/09/2016   Cough 01/08/2016   Wheezing 01/08/2016   Peripheral edema 10/02/2015   Pain of right heel 06/18/2013   Encounter for well adult exam with abnormal findings 05/31/2012   Hyperlipidemia 05/31/2012   Carpal tunnel syndrome, right 05/31/2012   Dizziness 04/19/2011   LOW BACK PAIN, ACUTE 05/27/2010   GENITAL HERPES 08/22/2006   VENEREAL WART 08/22/2006   ANXIETY DISORDER, GENERALIZED 08/22/2006   NEPHROLITHIASIS 08/22/2006   PSORIASIS 08/22/2006   DEGENERATIVE DISC DISEASE 08/22/2006   CERVICAL RADICULOPATHY 08/22/2006   PNEUMONIA 01/22/2006    Patient's Medications  New Prescriptions   No medications on file  Previous Medications   AMLODIPINE-OLMESARTAN (AZOR) 5-40 MG TABLET    Take 1 tablet by mouth daily.   ASPIRIN 81 MG TABLET    Take 81 mg by mouth daily.   ATORVASTATIN (LIPITOR) 20 MG TABLET    Take 1 tablet (20 mg total) by mouth daily. APPOINTMENT NEEDED FOR ADDITIONAL REFILLS   AUGMENTED BETAMETHASONE DIPROPIONATE (DIPROLENE-AF) 0.05 % CREAM    APPLY TO AFFECTED AREA UP TO TWICE A DAY AS NEEDED( NOT TO FACE,GROIN OR UNDERASRMS )   DARUNAVIR-COBICISTAT-EMTRICITABINE-TENOFOVIR ALAFENAMIDE (SYMTUZA) 800-150-200-10 MG TABS    Take 1 tablet by mouth daily with breakfast.   DICLOFENAC (VOLTAREN) 50  MG EC TABLET    TAKE 1 TABLET BY MOUTH TWICE A DAY AS NEEDED   DICLOFENAC SODIUM (VOLTAREN) 1 % GEL    APPLY 4 G TOPICALLY 4 (FOUR) TIMES DAILY AS NEEDED.   PANTOPRAZOLE (PROTONIX) 40 MG TABLET    Take 1 tablet (40 mg total) by mouth daily.   PODOFILOX (CONDYLOX) 0.5 % GEL    Apply topically 2 (two) times daily.   VALACYCLOVIR (VALTREX) 500 MG TABLET    Take 1 tablet (500 mg total) by mouth 2 (two) times daily.  Modified Medications   Modified Medication Previous Medication   DARUNAVIR-COBICISTAT-EMTRICITABINE-TENOFOVIR ALAFENAMIDE (SYMTUZA) 800-150-200-10 MG TABS Darunavir-Cobicistat-Emtricitabine-Tenofovir Alafenamide (SYMTUZA) 800-150-200-10 MG TABS      Take 1 tablet by mouth daily with breakfast.    Take 1 tablet by mouth daily with breakfast.  Discontinued Medications   No medications on file    Subjective: Anthony Steele is in for his routine HIV follow-up visit.  He is feeling well other than some recent swelling and pain in his left lower leg.  He has been working at [employer] and has been on his feet much more than normal.  He has been wearing support hose with some relief of his discomfort and swelling.  He has not had any problems obtaining, taking or tolerating his Symtuza since he switched over to his current insurance plan and got coverage.  He did miss about 5 days when he first changed.  He has had his annual flu shot and wants to get his COVID booster here today.  He no longer smokes cigarettes.  He has changed his diet over the past year and is working out regularly.  He has lost weight and gained muscle.  He has been able to come off of his blood pressure medications.  Review of Systems: Review of Systems  Constitutional:  Positive for weight loss. Negative for fever.  Musculoskeletal:  Positive for joint pain.   Past Medical History:  Diagnosis Date   ANXIETY DISORDER, GENERALIZED 08/22/2006   Qualifier: Diagnosis of  By: [name], Jeliyah Middlebrooks     Carpal tunnel syndrome,  right 05/31/2012   CERVICAL RADICULOPATHY 08/22/2006   Annotation: L arm 2002 Qualifier: Diagnosis of  By: [name], [place] DISEASE 08/22/2006   Qualifier: Diagnosis of  By: [name], Whitten Andreoni     GENITAL HERPES 08/22/2006   Qualifier: Diagnosis of  By: [name], Brevon Dewald     GERD 07/28/2006   Qualifier: Diagnosis of  By: [name], Yarima Penman     GERD (gastroesophageal reflux disease)    HEPATITIS B, CHRONIC 07/28/2006   Qualifier: Diagnosis of  By: [name], Chele Cornell     HIV DISEASE 07/28/2006   Annotation: dx 2000 Qualifier: Diagnosis of  By: [name], Kiasha Bellin     HIV infection [facility])    Hyperlipidemia    Hypertension    HYPERTENSION NEC 07/07/2009   Qualifier: Diagnosis of  By: [name], Danira Nylander     Kidney stones    NEPHROLITHIASIS 08/22/2006   Annotation: x2, last 1992 Qualifier: Diagnosis of  By: [name], Keanna Tugwell     Obesity 09/12/2011   PSORIASIS 08/22/2006   Qualifier: Diagnosis of  By: [name], Constantinos Krempasky      Social History   Tobacco Use   Smoking status: Former    Packs/day: 0.10    Types: Cigarettes    Quit date: 07/18/2012    Years since quitting: 8.8   Smokeless tobacco: Never   Tobacco comments:    quit over cruise over the holidays  Substance Use Topics   Alcohol use: Yes    Alcohol/week: 3.0 - 5.0 standard drinks    Types: 3 - 5 Standard drinks or equivalent per week    Comment: occ   Drug use: No    Family History  Problem Relation Age of Onset   Colon cancer Sister    Heart disease Other    Hypertension Other    Arthritis Other    Cancer Other        colon cancer   Diabetes Maternal Aunt    Heart failure Mother     Allergies  Allergen Reactions   Lisinopril Rash    Health Maintenance  Topic Date Due   COVID-19 Vaccine (5 - Booster for Pfizer series) 01/29/2021   COLONOSCOPY (Pts 45-55yrs Insurance coverage will need to be confirmed)  04/04/2023   Pneumococcal Vaccine 55-49 Years old (4 - PPSV23 if available, else PCV20) 04/27/2027    TETANUS/TDAP  02/18/2029   INFLUENZA VACCINE  Completed   Hepatitis C Screening  Completed   HIV Screening  Completed   Zoster Vaccines- Shingrix  Completed   HPV VACCINES  Aged Out    Objective:  Vitals:   05/18/21 1344  BP: (!) 152/88  Pulse: 64  Resp: 16  SpO2: 99%  Weight: 216 lb 4.8 oz (98.1 kg)  Height: 6' (1.829 m)  Body mass index is 29.34 kg/m.  Physical Exam Constitutional:      Comments: He is in good spirits.  Cardiovascular:     Rate and Rhythm: Normal rate.  Pulmonary:     Effort: Pulmonary effort is normal.  Musculoskeletal:     Right lower leg: Edema present.     Left lower leg: Edema present.     Comments: He has 1+ pitting edema of his lower extremities.  He has faint erythema of his left shin.  There is no unusual warmth.  Skin:    Findings: Erythema present.  Psychiatric:        Mood and Affect: Mood normal.    Lab Results Lab Results  Component Value Date   WBC 7.2 05/04/2021   HGB 13.8 05/04/2021   HCT 40.6 05/04/2021   MCV 96.2 05/04/2021   PLT 175 05/04/2021    Lab Results  Component Value Date   CREATININE 0.87 05/04/2021   BUN 21 05/04/2021   NA 140 05/04/2021   K 4.8 05/04/2021   CL 102 05/04/2021   CO2 30 05/04/2021    Lab Results  Component Value Date   ALT 46 05/04/2021   AST 55 (H) 05/04/2021   ALKPHOS 60 03/23/2021   BILITOT 0.7 05/04/2021    Lab Results  Component Value Date   CHOL 152 05/04/2021   HDL 59 05/04/2021   LDLCALC 81 05/04/2021   TRIG 42 05/04/2021   CHOLHDL 2.6 05/04/2021   Lab Results  Component Value Date   LABRPR NON-REACTIVE 05/04/2021   HIV 1 RNA Quant  Date Value  05/04/2021 Not Detected Copies/mL  04/14/2020 <20 Copies/mL (H)  04/11/2019 <20 DETECTED copies/mL (A)   CD4 T Cell Abs (/uL)  Date Value  05/04/2021 487  04/14/2020 465  03/28/2018 790     Problem List Items Addressed This Visit       1.   Human immunodeficiency virus (HIV) disease ([place])    His HIV infection  remains under excellent, long-term control.  He will continue Symtuza and follow-up after lab work in 1 year.      Relevant Medications   Darunavir-Cobicistat-Emtricitabine-Tenofovir Alafenamide (SYMTUZA) 800-150-200-10 MG TABS   Other Relevant Orders   CBC   T-helper cell (CD4)- ([facility] clinic only)   Comprehensive metabolic panel   Lipid panel   RPR   HIV-1 RNA quant-no reflex-bld   Hepatitis B DNA, ultraquantitative, PCR   Hepatitis B e antibody   Hepatitis B e antigen   Hepatitis B surface antibody,qualitative   Hepatitis B surface antigen   Chronic hepatitis B virus infection ([place])    His hepatitis B infection remains under excellent, long-term control and his e antigen has converted to nonreactive.  He will continue Symtuza and follow-up after repeat blood work including hepatitis B surface antibody in 1 year.      Relevant Medications   Darunavir-Cobicistat-Emtricitabine-Tenofovir Alafenamide (SYMTUZA) 800-150-200-10 MG TABS     Unprioritized   Venous insufficiency of both lower extremities    I believe he has acute on chronic venous insufficiency causing his left leg discomfort.  I encouraged him to continue wearing the compression stockings         Michel Bickers, MD [facility] for [place] [phone] pager   [phone] cell 05/18/2021, 2:10 PM

## 2021-05-18 NOTE — Assessment & Plan Note (Signed)
I believe he has acute on chronic venous insufficiency causing his left leg discomfort.  I encouraged him to continue wearing the compression stockings

## 2021-05-18 NOTE — Assessment & Plan Note (Signed)
His HIV infection remains under excellent, long-term control.  He will continue Symtuza and follow-up after lab work in 1 year.

## 2021-05-24 ENCOUNTER — Other Ambulatory Visit (HOSPITAL_COMMUNITY): Payer: Self-pay

## 2021-06-03 ENCOUNTER — Other Ambulatory Visit: Payer: Self-pay | Admitting: Pharmacist

## 2021-06-03 ENCOUNTER — Other Ambulatory Visit (HOSPITAL_COMMUNITY): Payer: Self-pay

## 2021-06-03 DIAGNOSIS — B20 Human immunodeficiency virus [HIV] disease: Secondary | ICD-10-CM

## 2021-06-03 MED ORDER — SYMTUZA 800-150-200-10 MG PO TABS
1.0000 | ORAL_TABLET | Freq: Every day | ORAL | 11 refills | Status: DC
  Filled 2021-06-03 – 2021-08-05 (×4): qty 30, 30d supply, fill #0
  Filled 2021-08-30: qty 30, 30d supply, fill #1
  Filled 2021-09-30: qty 30, 30d supply, fill #2
  Filled 2021-10-29: qty 30, 30d supply, fill #3
  Filled 2021-11-24: qty 30, 30d supply, fill #4
  Filled 2021-12-21: qty 30, 30d supply, fill #5
  Filled 2022-01-19: qty 30, 30d supply, fill #6
  Filled 2022-02-21: qty 30, 30d supply, fill #7
  Filled 2022-03-17: qty 30, 30d supply, fill #8

## 2021-06-03 MED ORDER — SYMTUZA 800-150-200-10 MG PO TABS: 1.0000 | ORAL_TABLET | Freq: Every day | ORAL | 11 refills | Status: DC

## 2021-06-07 ENCOUNTER — Other Ambulatory Visit (HOSPITAL_COMMUNITY): Payer: Self-pay

## 2021-06-10 ENCOUNTER — Other Ambulatory Visit: Payer: Self-pay | Admitting: Pharmacist

## 2021-06-10 DIAGNOSIS — B20 Human immunodeficiency virus [HIV] disease: Secondary | ICD-10-CM

## 2021-06-10 MED ORDER — SYMTUZA 800-150-200-10 MG PO TABS
1.0000 | ORAL_TABLET | Freq: Every day | ORAL | 0 refills | Status: AC
Start: 2021-06-08 — End: 2021-07-08

## 2021-06-10 NOTE — Progress Notes (Signed)
Medication Samples have been provided to the patient.  Drug name: Symtuza        Strength: 800/150/200/10 mg Qty: 1 bottle (30 tablets)  LOT: [id number]   Exp.Date: 09/22/2021  Dosing instructions: Take one tablet by mouth once daily with food  The patient has been instructed regarding the correct time, dose, and frequency of taking this medication, including desired effects and most common side effects.   Joniel Graumann L. Eber Hong, PharmD, BCIDP, AAHIVP, CPP Clinical Pharmacist Practitioner Infectious Diseases [place] [facility] 07/06/2020, 10:07 AM

## 2021-06-23 ENCOUNTER — Encounter: Payer: Self-pay | Admitting: Internal Medicine

## 2021-07-01 ENCOUNTER — Other Ambulatory Visit (HOSPITAL_COMMUNITY): Payer: Self-pay

## 2021-07-02 ENCOUNTER — Encounter: Payer: Self-pay | Admitting: Internal Medicine

## 2021-07-02 ENCOUNTER — Other Ambulatory Visit: Payer: Self-pay

## 2021-07-02 ENCOUNTER — Ambulatory Visit (INDEPENDENT_AMBULATORY_CARE_PROVIDER_SITE_OTHER): Payer: No Typology Code available for payment source | Admitting: Internal Medicine

## 2021-07-02 VITALS — BP 120/72 | HR 71 | Temp 99.6°F | Ht 72.0 in | Wt 219.4 lb

## 2021-07-02 DIAGNOSIS — M79669 Pain in unspecified lower leg: Secondary | ICD-10-CM | POA: Diagnosis not present

## 2021-07-02 DIAGNOSIS — R739 Hyperglycemia, unspecified: Secondary | ICD-10-CM

## 2021-07-02 DIAGNOSIS — E559 Vitamin D deficiency, unspecified: Secondary | ICD-10-CM | POA: Diagnosis not present

## 2021-07-02 DIAGNOSIS — M7989 Other specified soft tissue disorders: Secondary | ICD-10-CM | POA: Diagnosis not present

## 2021-07-02 DIAGNOSIS — I10 Essential (primary) hypertension: Secondary | ICD-10-CM

## 2021-07-02 LAB — BASIC METABOLIC PANEL
BUN: 26 mg/dL — ABNORMAL HIGH (ref 6–23)
CO2: 28 mEq/L (ref 19–32)
Calcium: 9.9 mg/dL (ref 8.4–10.5)
Chloride: 103 mEq/L (ref 96–112)
Creatinine, Ser: 0.93 mg/dL (ref 0.40–1.50)
GFR: 90.08 mL/min (ref >60.00)
Glucose, Bld: 100 mg/dL — ABNORMAL HIGH (ref 70–99)
Potassium: 4.6 mEq/L (ref 3.5–5.1)
Sodium: 137 mEq/L (ref 135–145)

## 2021-07-02 LAB — CBC WITH DIFFERENTIAL/PLATELET
Basophils Absolute: 0 10*3/uL (ref 0.0–0.1)
Basophils Relative: 0.4 % (ref 0.0–3.0)
Eosinophils Absolute: 0 10*3/uL (ref 0.0–0.7)
Eosinophils Relative: 0.6 % (ref 0.0–5.0)
HCT: 39.6 % (ref 39.0–52.0)
Hemoglobin: 13.2 g/dL (ref 13.0–17.0)
Lymphocytes Relative: 24 % (ref 12.0–46.0)
Lymphs Abs: 1.9 10*3/uL (ref 0.7–4.0)
MCHC: 33.3 g/dL (ref 30.0–36.0)
MCV: 97.1 fl (ref 78.0–100.0)
Monocytes Absolute: 0.5 10*3/uL (ref 0.1–1.0)
Monocytes Relative: 6.2 % (ref 3.0–12.0)
Neutro Abs: 5.4 10*3/uL (ref 1.4–7.7)
Neutrophils Relative %: 68.8 % (ref 43.0–77.0)
Platelets: 203 10*3/uL (ref 150.0–400.0)
RBC: 4.08 Mil/uL — ABNORMAL LOW (ref 4.22–5.81)
RDW: 13.2 % (ref 11.5–15.5)
WBC: 7.9 10*3/uL (ref 4.0–10.5)

## 2021-07-02 LAB — HEPATIC FUNCTION PANEL
ALT: 36 U/L (ref 0–53)
AST: 46 U/L — ABNORMAL HIGH (ref 0–37)
Albumin: 4.4 g/dL (ref 3.5–5.2)
Alkaline Phosphatase: 69 U/L (ref 39–117)
Bilirubin, Direct: 0.2 mg/dL (ref 0.0–0.3)
Total Bilirubin: 0.6 mg/dL (ref 0.2–1.2)
Total Protein: 7.5 g/dL (ref 6.0–8.3)

## 2021-07-02 LAB — URINALYSIS, ROUTINE W REFLEX MICROSCOPIC
Bilirubin Urine: NEGATIVE
Hgb urine dipstick: NEGATIVE
Ketones, ur: NEGATIVE
Leukocytes,Ua: NEGATIVE
Nitrite: NEGATIVE
Specific Gravity, Urine: 1.015 (ref 1.000–1.030)
Total Protein, Urine: NEGATIVE
Urine Glucose: NEGATIVE
Urobilinogen, UA: 0.2 (ref 0.0–1.0)
pH: 6.5 (ref 5.0–8.0)

## 2021-07-02 LAB — BRAIN NATRIURETIC PEPTIDE: Pro B Natriuretic peptide (BNP): 25 pg/mL (ref 0.0–100.0)

## 2021-07-02 LAB — D-DIMER, QUANTITATIVE: D-Dimer, Quant: 0.39 mcg/mL FEU (ref <0.50)

## 2021-07-02 NOTE — Progress Notes (Signed)
Patient ID: Anthony Steele, male   DOB: 12-Sep-1960, 60 y.o.   MRN: [id number]        Chief Complaint: follow up recent worsening bilateral LE swelling       HPI:  Anthony Steele is a 60 y.o. male here 6 wks onset worsening LE swelling and tightness left more than right only below the knees, now uncomfortable to degree cant be ignored; denies any change in oral fluids, Pt denies chest pain, increased sob or doe, wheezing, orthopnea, PND, palpitations, dizziness or syncope.   Pt denies polydipsia, polyuria, or new focal neuro s/s.   Pt denies fever, wt loss, night sweats, loss of appetite, or other constitutional symptoms  No hx of DVT, no recent change in activity work or otherwise, such as increased standing or legs in more dependent position than previous.  No recent change in meds except for change to symtuza.x 2-3 mo.  Denies worsening reflux, abd pain, dysphagia, n/v, bowel change or blood.       Wt Readings from Last 3 Encounters:  07/02/21 219 lb 6.4 oz (99.5 kg)  05/18/21 216 lb 4.8 oz (98.1 kg)  03/23/21 214 lb (97.1 kg)   BP Readings from Last 3 Encounters:  07/02/21 120/72  05/18/21 (!) 152/88  03/23/21 128/76         Past Medical History:  Diagnosis Date   ANXIETY DISORDER, GENERALIZED 08/22/2006   Qualifier: Diagnosis of  By: [name], Abryanna Musolino     Carpal tunnel syndrome, right 05/31/2012   CERVICAL RADICULOPATHY 08/22/2006   Annotation: L arm 2002 Qualifier: Diagnosis of  By: [name], [place] DISEASE 08/22/2006   Qualifier: Diagnosis of  By: [name], Jadyn Brasher     GENITAL HERPES 08/22/2006   Qualifier: Diagnosis of  By: [name], Tekeisha Hakim     GERD 07/28/2006   Qualifier: Diagnosis of  By: [name], Monay Houlton     GERD (gastroesophageal reflux disease)    HEPATITIS B, CHRONIC 07/28/2006   Qualifier: Diagnosis of  By: [name], Jazziel Fitzsimmons     HIV DISEASE 07/28/2006   Annotation: dx 2000 Qualifier: Diagnosis of  By: [name], Georgiana Spillane     HIV infection [facility])     Hyperlipidemia    Hypertension    HYPERTENSION [place] 07/07/2009   Qualifier: Diagnosis of  By: [name], Mithra Spano     Kidney stones    NEPHROLITHIASIS 08/22/2006   Annotation: x2, last 1992 Qualifier: Diagnosis of  By: [name], Vada Yellen     Obesity 09/12/2011   PSORIASIS 08/22/2006   Qualifier: Diagnosis of  By: [name], Alyas Creary     Past Surgical History:  Procedure Laterality Date   COLONOSCOPY     CYSTECTOMY  2000   cyst removed off buttock cheek    reports that he quit smoking about 8 years ago. His smoking use included cigarettes. He smoked an average of .1 packs per day. He has never used smokeless tobacco. He reports current alcohol use of about 3.0 - 5.0 standard drinks per week. He reports that he does not use drugs. family history includes Arthritis in an other family member; Cancer in an other family member; Colon cancer in his sister; Diabetes in his maternal aunt; Heart disease in an other family member; Heart failure in his mother; Hypertension in an other family member. Allergies  Allergen Reactions   Lisinopril Rash   Current Outpatient Medications on File Prior to Visit  Medication Sig Dispense Refill  aspirin 81 MG tablet Take 81 mg by mouth daily.     atorvastatin (LIPITOR) 20 MG tablet Take 1 tablet (20 mg total) by mouth daily. APPOINTMENT NEEDED FOR ADDITIONAL REFILLS 90 tablet 3   augmented betamethasone dipropionate (DIPROLENE-AF) 0.05 % cream APPLY TO AFFECTED AREA UP TO TWICE A DAY AS NEEDED( NOT TO FACE,GROIN OR UNDERASRMS )     Darunavir-Cobicistat-Emtricitabine-Tenofovir Alafenamide (SYMTUZA) 800-150-200-10 MG TABS Take 1 tablet by mouth daily with breakfast. 30 tablet 11   Darunavir-Cobicistat-Emtricitabine-Tenofovir Alafenamide (SYMTUZA) 800-150-200-10 MG TABS Take 1 tablet by mouth daily with breakfast. 30 tablet 0   diclofenac (VOLTAREN) 50 MG EC tablet TAKE 1 TABLET BY MOUTH TWICE A DAY AS NEEDED 180 tablet 3   diclofenac Sodium (VOLTAREN) 1 % GEL APPLY 4 G  TOPICALLY 4 (FOUR) TIMES DAILY AS NEEDED. 400 g 3   pantoprazole (PROTONIX) 40 MG tablet Take 1 tablet (40 mg total) by mouth daily. 90 tablet 3   valACYclovir (VALTREX) 500 MG tablet Take 1 tablet (500 mg total) by mouth 2 (two) times daily. 180 tablet 3   amLODipine-olmesartan (AZOR) 5-40 MG tablet Take 1 tablet by mouth daily. (Patient not taking: Reported on 03/23/2021) 90 tablet 3   podofilox (CONDYLOX) 0.5 % gel Apply topically 2 (two) times daily. (Patient not taking: Reported on 07/02/2021) 3.5 g 2   No current facility-administered medications on file prior to visit.        ROS:  All others reviewed and negative.  Objective        PE:  BP 120/72 (BP Location: Right Arm, Patient Position: Sitting, Cuff Size: Large)   Pulse 71   Temp 99.6 F (37.6 C) (Oral)   Ht 6' (1.829 m)   Wt 219 lb 6.4 oz (99.5 kg)   SpO2 97%   BMI 29.76 kg/m                 Constitutional: Pt appears in NAD               HENT: Head: NCAT.                Right Ear: External ear normal.                 Left Ear: External ear normal.                Eyes: . Pupils are equal, round, and reactive to light. Conjunctivae and EOM are normal               Nose: without d/c or deformity               Neck: Neck supple. Gross normal ROM               Cardiovascular: Normal rate and regular rhythm.                 Pulmonary/Chest: Effort normal and breath sounds without rales or wheezing.                Abd:  Soft, NT, ND, + BS, no organomegaly               Neurological: Pt is alert. At baseline orientation, motor grossly intact               Skin: Skin is warm. No rashes, no other new lesions, LE edema - 1+ left > right to knees with numerous large varicosities  Psychiatric: Pt behavior is normal without agitation   Micro: none  Cardiac tracings I have personally interpreted today:  none  Pertinent Radiological findings (summarize): none   Lab Results  Component Value Date   WBC 7.9 07/02/2021    HGB 13.2 07/02/2021   HCT 39.6 07/02/2021   PLT 203.0 07/02/2021   GLUCOSE 100 (H) 07/02/2021   CHOL 152 05/04/2021   TRIG 42 05/04/2021   HDL 59 05/04/2021   LDLCALC 81 05/04/2021   ALT 36 07/02/2021   AST 46 (H) 07/02/2021   NA 137 07/02/2021   K 4.6 07/02/2021   CL 103 07/02/2021   CREATININE 0.93 07/02/2021   BUN 26 (H) 07/02/2021   CO2 28 07/02/2021   TSH 3.24 03/23/2021   PSA 0.75 03/23/2021   HGBA1C 5.5 03/23/2021   Assessment/Plan:  Guss Farruggia is a 60 y.o. White or Caucasian [1] male with  has a past medical history of ANXIETY DISORDER, GENERALIZED (08/22/2006), Carpal tunnel syndrome, right (05/31/2012), CERVICAL RADICULOPATHY (08/22/2006), DEGENERATIVE DISC DISEASE (08/22/2006), GENITAL HERPES (08/22/2006), GERD (07/28/2006), GERD (gastroesophageal reflux disease), HEPATITIS B, CHRONIC (07/28/2006), HIV DISEASE (07/28/2006), HIV infection ([place]), Hyperlipidemia, Hypertension, HYPERTENSION NEC (07/07/2009), Kidney stones, NEPHROLITHIASIS (08/22/2006), Obesity (09/12/2011), and PSORIASIS (08/22/2006).  Pain and swelling of lower leg ? Venous insufficiency vs other - for bilateral venous dopplers, labs as ordered including d dimer and bnp, consider abd imaging if not helpful  Essential hypertension BP Readings from Last 3 Encounters:  07/02/21 120/72  05/18/21 (!) 152/88  03/23/21 128/76   Stable, pt to continue medical treatment azor   Hyperglycemia Lab Results  Component Value Date   HGBA1C 5.5 03/23/2021   Stable, pt to continue current medical treatment  - diet   Vitamin D deficiency Last vitamin D Lab Results  Component Value Date   VD25OH 33.66 03/23/2021   Low, reminded to start oral replacement  Followup: Return if symptoms worsen or fail to improve.  Cathlean Cower, MD 07/04/2021 8:17 AM [facility]

## 2021-07-02 NOTE — Patient Instructions (Addendum)
You will be contacted regarding the referral for: venous doppler ultrasound (to check for blood clots in the legs) after 1 pm today  Please continue all other medications as before, and refills have been done if requested.  Please have the pharmacy call with any other refills you may need.  Please continue your efforts at being more active, low cholesterol diet, and weight control.  Please keep your appointments with your specialists as you may have planned  Please go to the LAB at the blood drawing area for the tests to be done  You will be contacted by phone if any changes need to be made immediately.  Otherwise, you will receive a letter about your results with an explanation, but please check with MyChart first.  Please remember to sign up for MyChart if you have not done so, as this will be important to you in the future with finding out test results, communicating by private email, and scheduling acute appointments online when needed.  We may have to consider Abd/pelvis CT if testing is not helpful

## 2021-07-04 ENCOUNTER — Encounter: Payer: Self-pay | Admitting: Internal Medicine

## 2021-07-04 DIAGNOSIS — M7989 Other specified soft tissue disorders: Secondary | ICD-10-CM | POA: Insufficient documentation

## 2021-07-04 DIAGNOSIS — M79669 Pain in unspecified lower leg: Secondary | ICD-10-CM | POA: Insufficient documentation

## 2021-07-04 NOTE — Assessment & Plan Note (Signed)
?   Venous insufficiency vs other - for bilateral venous dopplers, labs as ordered including d dimer and bnp, consider abd imaging if not helpful

## 2021-07-04 NOTE — Assessment & Plan Note (Signed)
Lab Results  Component Value Date   HGBA1C 5.5 03/23/2021   Stable, pt to continue current medical treatment  - diet

## 2021-07-04 NOTE — Assessment & Plan Note (Signed)
BP Readings from Last 3 Encounters:  07/02/21 120/72  05/18/21 (!) 152/88  03/23/21 128/76   Stable, pt to continue medical treatment azor

## 2021-07-04 NOTE — Assessment & Plan Note (Signed)
Last vitamin D Lab Results  Component Value Date   VD25OH 33.66 03/23/2021   Low, reminded to start oral replacement  

## 2021-07-05 ENCOUNTER — Other Ambulatory Visit (HOSPITAL_COMMUNITY): Payer: Self-pay

## 2021-07-06 ENCOUNTER — Other Ambulatory Visit (HOSPITAL_COMMUNITY): Payer: Self-pay

## 2021-07-07 ENCOUNTER — Ambulatory Visit (HOSPITAL_COMMUNITY)
Admission: RE | Admit: 2021-07-07 | Discharge: 2021-07-07 | Disposition: A | Discharge status: Home / Self Care | Payer: No Typology Code available for payment source | Source: Ambulatory Visit | Attending: Cardiology | Admitting: Cardiology

## 2021-07-07 ENCOUNTER — Other Ambulatory Visit: Payer: Self-pay

## 2021-07-07 ENCOUNTER — Encounter: Payer: Self-pay | Admitting: Internal Medicine

## 2021-07-07 DIAGNOSIS — M79669 Pain in unspecified lower leg: Secondary | ICD-10-CM

## 2021-07-07 DIAGNOSIS — M79661 Pain in right lower leg: Secondary | ICD-10-CM

## 2021-07-07 DIAGNOSIS — M79662 Pain in left lower leg: Secondary | ICD-10-CM

## 2021-07-07 DIAGNOSIS — M7989 Other specified soft tissue disorders: Secondary | ICD-10-CM | POA: Diagnosis not present

## 2021-07-13 ENCOUNTER — Other Ambulatory Visit (HOSPITAL_COMMUNITY): Payer: Self-pay

## 2021-07-15 ENCOUNTER — Other Ambulatory Visit: Payer: Self-pay | Admitting: Pharmacist

## 2021-07-15 DIAGNOSIS — B20 Human immunodeficiency virus [HIV] disease: Secondary | ICD-10-CM

## 2021-07-15 MED ORDER — SYMTUZA 800-150-200-10 MG PO TABS: 1.0000 | ORAL_TABLET | Freq: Every day | ORAL | 0 refills | Status: AC

## 2021-07-15 NOTE — Progress Notes (Signed)
Medication Samples have been provided to the patient.  Drug name: Symtuza        Strength: 800/150/200/10 mg Qty: 30 tablets (1 bottle)  LOT: [id number]   Exp.Date: 12/24/2022  Dosing instructions: Take one tablet by mouth once daily with food  The patient has been instructed regarding the correct time, dose, and frequency of taking this medication, including desired effects and most common side effects.   Analuisa Tudor L. Eber Hong, PharmD, BCIDP, AAHIVP, CPP Clinical Pharmacist Practitioner Infect[facility] 07/06/2020, 10:07 AM

## 2021-08-02 ENCOUNTER — Other Ambulatory Visit (HOSPITAL_COMMUNITY): Payer: Self-pay

## 2021-08-05 ENCOUNTER — Other Ambulatory Visit (HOSPITAL_COMMUNITY): Payer: Self-pay

## 2021-08-06 ENCOUNTER — Other Ambulatory Visit (HOSPITAL_COMMUNITY): Payer: Self-pay

## 2021-08-09 ENCOUNTER — Other Ambulatory Visit (HOSPITAL_COMMUNITY): Payer: Self-pay

## 2021-08-27 ENCOUNTER — Other Ambulatory Visit (HOSPITAL_COMMUNITY): Payer: Self-pay

## 2021-08-30 ENCOUNTER — Other Ambulatory Visit (HOSPITAL_COMMUNITY): Payer: Self-pay

## 2021-09-28 ENCOUNTER — Other Ambulatory Visit (HOSPITAL_COMMUNITY): Payer: Self-pay

## 2021-09-30 ENCOUNTER — Other Ambulatory Visit (HOSPITAL_COMMUNITY): Payer: Self-pay

## 2021-10-01 ENCOUNTER — Other Ambulatory Visit (HOSPITAL_COMMUNITY): Payer: Self-pay

## 2021-10-26 ENCOUNTER — Other Ambulatory Visit (HOSPITAL_COMMUNITY): Payer: Self-pay

## 2021-10-28 ENCOUNTER — Other Ambulatory Visit (HOSPITAL_COMMUNITY): Payer: Self-pay

## 2021-10-29 ENCOUNTER — Other Ambulatory Visit (HOSPITAL_COMMUNITY): Payer: Self-pay

## 2021-11-23 ENCOUNTER — Other Ambulatory Visit (HOSPITAL_COMMUNITY): Payer: Self-pay

## 2021-11-24 ENCOUNTER — Other Ambulatory Visit (HOSPITAL_COMMUNITY): Payer: Self-pay

## 2021-11-29 ENCOUNTER — Other Ambulatory Visit (HOSPITAL_COMMUNITY): Payer: Self-pay

## 2021-12-21 ENCOUNTER — Other Ambulatory Visit (HOSPITAL_COMMUNITY): Payer: Self-pay

## 2021-12-23 ENCOUNTER — Other Ambulatory Visit (HOSPITAL_COMMUNITY): Payer: Self-pay

## 2022-01-18 ENCOUNTER — Other Ambulatory Visit (HOSPITAL_COMMUNITY): Payer: Self-pay

## 2022-01-19 ENCOUNTER — Other Ambulatory Visit (HOSPITAL_COMMUNITY): Payer: Self-pay

## 2022-01-19 ENCOUNTER — Encounter: Payer: Self-pay | Admitting: Internal Medicine

## 2022-01-19 DIAGNOSIS — A6000 Herpesviral infection of urogenital system, unspecified: Secondary | ICD-10-CM

## 2022-01-20 ENCOUNTER — Telehealth: Payer: Self-pay

## 2022-01-20 NOTE — Telephone Encounter (Signed)
Left patient a voice mail to call back to schedule his one year follow up with Dr. Megan Salon after a request we received from patient for 9/15-10/20

## 2022-01-21 DIAGNOSIS — M25512 Pain in left shoulder: Secondary | ICD-10-CM | POA: Diagnosis not present

## 2022-01-24 ENCOUNTER — Other Ambulatory Visit (HOSPITAL_COMMUNITY): Payer: Self-pay

## 2022-01-24 MED ORDER — PANTOPRAZOLE SODIUM 40 MG PO TBEC: 40.0000 mg | DELAYED_RELEASE_TABLET | Freq: Every day | ORAL | 0 refills | Status: DC

## 2022-01-24 MED ORDER — VALACYCLOVIR HCL 500 MG PO TABS: 500.0000 mg | ORAL_TABLET | Freq: Two times a day (BID) | ORAL | 0 refills | Status: DC

## 2022-01-24 MED ORDER — ATORVASTATIN CALCIUM 20 MG PO TABS: 20.0000 mg | ORAL_TABLET | Freq: Every day | ORAL | 0 refills | Status: DC

## 2022-02-03 DIAGNOSIS — M25512 Pain in left shoulder: Secondary | ICD-10-CM | POA: Diagnosis not present

## 2022-02-07 ENCOUNTER — Other Ambulatory Visit: Payer: Self-pay | Admitting: Internal Medicine

## 2022-02-07 DIAGNOSIS — A6000 Herpesviral infection of urogenital system, unspecified: Secondary | ICD-10-CM

## 2022-02-17 ENCOUNTER — Other Ambulatory Visit: Payer: Self-pay | Admitting: Internal Medicine

## 2022-02-17 ENCOUNTER — Other Ambulatory Visit (HOSPITAL_COMMUNITY): Payer: Self-pay

## 2022-02-17 NOTE — Telephone Encounter (Signed)
Please refill as per office routine med refill policy (all routine meds to be refilled for 3 mo or monthly (per pt preference) up to one year from last visit, then month to month grace period for 3 mo, then further med refills will have to be denied) ? ?

## 2022-02-21 ENCOUNTER — Other Ambulatory Visit (HOSPITAL_COMMUNITY): Payer: Self-pay

## 2022-02-24 ENCOUNTER — Other Ambulatory Visit (HOSPITAL_COMMUNITY): Payer: Self-pay

## 2022-02-25 DIAGNOSIS — M25512 Pain in left shoulder: Secondary | ICD-10-CM | POA: Diagnosis not present

## 2022-03-10 ENCOUNTER — Other Ambulatory Visit: Payer: 59

## 2022-03-10 ENCOUNTER — Other Ambulatory Visit: Payer: Self-pay

## 2022-03-10 DIAGNOSIS — B20 Human immunodeficiency virus [HIV] disease: Secondary | ICD-10-CM

## 2022-03-10 DIAGNOSIS — M19012 Primary osteoarthritis, left shoulder: Secondary | ICD-10-CM | POA: Diagnosis not present

## 2022-03-10 DIAGNOSIS — R69 Illness, unspecified: Secondary | ICD-10-CM | POA: Diagnosis not present

## 2022-03-10 DIAGNOSIS — M75102 Unspecified rotator cuff tear or rupture of left shoulder, not specified as traumatic: Secondary | ICD-10-CM | POA: Diagnosis not present

## 2022-03-11 LAB — T-HELPER CELL (CD4) - (RCID CLINIC ONLY)
CD4 % Helper T Cell: 32 % — ABNORMAL LOW (ref 33–65)
CD4 T Cell Abs: 714 /uL (ref 400–1790)

## 2022-03-12 DIAGNOSIS — M75102 Unspecified rotator cuff tear or rupture of left shoulder, not specified as traumatic: Secondary | ICD-10-CM | POA: Insufficient documentation

## 2022-03-13 ENCOUNTER — Other Ambulatory Visit: Payer: Self-pay | Admitting: Internal Medicine

## 2022-03-13 NOTE — Telephone Encounter (Signed)
Please refill as per office routine med refill policy (all routine meds to be refilled for 3 mo or monthly (per pt preference) up to one year from last visit, then month to month grace period for 3 mo, then further med refills will have to be denied) ? ?

## 2022-03-14 NOTE — Telephone Encounter (Signed)
Pt was last seen dec 2022  Please reconsider refills based on office policy  Please refill as per office routine med refill policy (all routine meds to be refilled for 3 mo or monthly (per pt preference) up to one year from last visit, then month to month grace period for 3 mo, then further med refills will have to be denied)

## 2022-03-14 NOTE — Telephone Encounter (Signed)
Please refill as per office routine med refill policy (all routine meds to be refilled for 3 mo or monthly (per pt preference) up to one year from last visit, then month to month grace period for 3 mo, then further med refills will have to be denied) ? ?

## 2022-03-15 LAB — CBC
HCT: 39.4 % (ref 38.5–50.0)
Hemoglobin: 13.4 g/dL (ref 13.2–17.1)
MCH: 32.9 pg (ref 27.0–33.0)
MCHC: 34 g/dL (ref 32.0–36.0)
MCV: 96.8 fL (ref 80.0–100.0)
MPV: 10 fL (ref 7.5–12.5)
Platelets: 265 10*3/uL (ref 140–400)
RBC: 4.07 10*6/uL — ABNORMAL LOW (ref 4.20–5.80)
RDW: 11.9 % (ref 11.0–15.0)
WBC: 8.1 10*3/uL (ref 3.8–10.8)

## 2022-03-15 LAB — HIV-1 RNA QUANT-NO REFLEX-BLD
HIV 1 RNA Quant: NOT DETECTED Copies/mL
HIV-1 RNA Quant, Log: NOT DETECTED Log cps/mL

## 2022-03-15 LAB — HEPATITIS B DNA, ULTRAQUANTITATIVE, PCR
Hepatitis B DNA: <10 IU/mL — ABNORMAL HIGH
Hepatitis B virus DNA: <1 Log IU/mL — ABNORMAL HIGH

## 2022-03-15 LAB — COMPREHENSIVE METABOLIC PANEL
AG Ratio: 1.6 (calc) (ref 1.0–2.5)
ALT: 41 U/L (ref 9–46)
AST: 41 U/L — ABNORMAL HIGH (ref 10–35)
Albumin: 4.6 g/dL (ref 3.6–5.1)
Alkaline phosphatase (APISO): 79 U/L (ref 35–144)
BUN: 21 mg/dL (ref 7–25)
CO2: 29 mmol/L (ref 20–32)
Calcium: 9.9 mg/dL (ref 8.6–10.3)
Chloride: 100 mmol/L (ref 98–110)
Creat: 0.93 mg/dL (ref 0.70–1.30)
Globulin: 2.9 g/dL (calc) (ref 1.9–3.7)
Glucose, Bld: 85 mg/dL (ref 65–99)
Potassium: 4.3 mmol/L (ref 3.5–5.3)
Sodium: 140 mmol/L (ref 135–146)
Total Bilirubin: 0.7 mg/dL (ref 0.2–1.2)
Total Protein: 7.5 g/dL (ref 6.1–8.1)

## 2022-03-15 LAB — LIPID PANEL
Cholesterol: 159 mg/dL (ref <200)
HDL: 58 mg/dL (ref >=40)
LDL Cholesterol (Calc): 88 mg/dL (calc)
Non-HDL Cholesterol (Calc): 101 mg/dL (calc) (ref <130)
Total CHOL/HDL Ratio: 2.7 (calc) (ref <5.0)
Triglycerides: 52 mg/dL (ref <150)

## 2022-03-15 LAB — RPR: RPR Ser Ql: NONREACTIVE

## 2022-03-15 LAB — HEPATITIS B SURFACE ANTIBODY,QUALITATIVE: Hep B S Ab: NONREACTIVE

## 2022-03-15 LAB — HEPATITIS B E ANTIGEN: Hep B E Ag: REACTIVE — AB

## 2022-03-15 LAB — HEPATITIS B E ANTIBODY: Hep B E Ab: NONREACTIVE

## 2022-03-15 LAB — HEPATITIS B SURFACE ANTIGEN: Hepatitis B Surface Ag: REACTIVE — AB

## 2022-03-16 ENCOUNTER — Other Ambulatory Visit: Payer: Self-pay

## 2022-03-16 ENCOUNTER — Other Ambulatory Visit (HOSPITAL_COMMUNITY): Payer: Self-pay

## 2022-03-16 MED ORDER — ATORVASTATIN CALCIUM 20 MG PO TABS: 20.0000 mg | ORAL_TABLET | Freq: Every day | ORAL | 0 refills | Status: DC

## 2022-03-17 ENCOUNTER — Other Ambulatory Visit (HOSPITAL_COMMUNITY): Payer: Self-pay

## 2022-03-17 DIAGNOSIS — B081 Molluscum contagiosum: Secondary | ICD-10-CM | POA: Diagnosis not present

## 2022-03-17 DIAGNOSIS — B078 Other viral warts: Secondary | ICD-10-CM | POA: Diagnosis not present

## 2022-03-17 DIAGNOSIS — L821 Other seborrheic keratosis: Secondary | ICD-10-CM | POA: Diagnosis not present

## 2022-03-17 DIAGNOSIS — L814 Other melanin hyperpigmentation: Secondary | ICD-10-CM | POA: Diagnosis not present

## 2022-03-17 DIAGNOSIS — L720 Epidermal cyst: Secondary | ICD-10-CM | POA: Diagnosis not present

## 2022-03-24 ENCOUNTER — Ambulatory Visit: Payer: 59 | Admitting: Internal Medicine

## 2022-03-24 ENCOUNTER — Other Ambulatory Visit: Payer: Self-pay

## 2022-03-24 DIAGNOSIS — B181 Chronic viral hepatitis B without delta-agent: Secondary | ICD-10-CM

## 2022-03-24 DIAGNOSIS — B20 Human immunodeficiency virus [HIV] disease: Secondary | ICD-10-CM

## 2022-03-24 DIAGNOSIS — R69 Illness, unspecified: Secondary | ICD-10-CM | POA: Diagnosis not present

## 2022-03-24 MED ORDER — SYMTUZA 800-150-200-10 MG PO TABS: 1.0000 | ORAL_TABLET | Freq: Every day | ORAL | 11 refills | Status: DC

## 2022-03-24 NOTE — Progress Notes (Signed)
Patient Active Problem List   Diagnosis Date Noted   Human immunodeficiency virus (HIV) disease ([place]) 07/28/2006    Priority: High   Chronic hepatitis B virus infection ([place]) 07/28/2006    Priority: High   Obesity 09/12/2011    Priority: Medium    Essential hypertension 07/07/2009    Priority: Medium    Former smoker 05/08/2007    Priority: Medium    GERD 07/28/2006    Priority: Medium    Pain and swelling of lower leg 07/04/2021   Venous insufficiency of both lower extremities 05/18/2021   Vitamin D deficiency 03/27/2021   Vasovagal near syncope 06/05/2018   Acute upper respiratory infection 06/05/2018   Family history of colon cancer 11/15/2017   Chest pain 11/15/2017   Abnormal TSH 11/15/2017   Hyperglycemia 11/09/2016   Cough 01/08/2016   Wheezing 01/08/2016   Peripheral edema 10/02/2015   Pain of right heel 06/18/2013   Encounter for well adult exam with abnormal findings 05/31/2012   Hyperlipidemia 05/31/2012   Carpal tunnel syndrome, right 05/31/2012   Dizziness 04/19/2011   LOW BACK PAIN, ACUTE 05/27/2010   GENITAL HERPES 08/22/2006   VENEREAL WART 08/22/2006   ANXIETY DISORDER, GENERALIZED 08/22/2006   NEPHROLITHIASIS 08/22/2006   PSORIASIS 08/22/2006   DEGENERATIVE DISC DISEASE 08/22/2006   CERVICAL RADICULOPATHY 08/22/2006   PNEUMONIA 01/22/2006    Patient's Medications  New Prescriptions   No medications on file  Previous Medications   AMLODIPINE-OLMESARTAN (AZOR) 5-40 MG TABLET    Take 1 tablet by mouth daily.   ASPIRIN 81 MG TABLET    Take 81 mg by mouth daily.   ATORVASTATIN (LIPITOR) 20 MG TABLET    Take 1 tablet (20 mg total) by mouth daily. Annual appt is due must see provider for future refills   AUGMENTED BETAMETHASONE DIPROPIONATE (DIPROLENE-AF) 0.05 % CREAM    APPLY TO AFFECTED AREA UP TO TWICE A DAY AS NEEDED( NOT TO FACE,GROIN OR UNDERASRMS )   DICLOFENAC (VOLTAREN) 50 MG EC TABLET    TAKE 1 TABLET BY MOUTH TWICE A DAY AS  NEEDED   DICLOFENAC SODIUM (VOLTAREN) 1 % GEL    APPLY 4 G TOPICALLY 4 (FOUR) TIMES DAILY AS NEEDED.   PANTOPRAZOLE (PROTONIX) 40 MG TABLET    Take 1 tablet by mouth daily.   PODOFILOX (CONDYLOX) 0.5 % GEL    Apply topically 2 (two) times daily.   VALACYCLOVIR (VALTREX) 500 MG TABLET    TAKE 1 TABLET BY MOUTH TWICE A DAY  Modified Medications   Modified Medication Previous Medication   DARUNAVIR-COBICISTAT-EMTRICITABINE-TENOFOVIR ALAFENAMIDE (SYMTUZA) 800-150-200-10 MG TABS Darunavir-Cobicistat-Emtricitabine-Tenofovir Alafenamide (SYMTUZA) 800-150-200-10 MG TABS      Take 1 tablet by mouth daily with breakfast.    Take 1 tablet by mouth daily with breakfast.  Discontinued Medications   PANTOPRAZOLE (PROTONIX) 40 MG TABLET    TAKE 1 TABLET BY MOUTH EVERY DAY    Subjective: Anthony Steele is in for his routine HIV follow-up visit.  He denies any problems obtaining, taking or tolerating his Symtuza and has missed doses.  He lost a great deal of weight last year through lifestyle modification and increased exercise.  He is hoping to get below 200 pounds before his 61th birthday.  He has been able to stop his blood pressure medication.  He quit smoking cigarettes years ago.  Review of Systems: Review of Systems  Constitutional:  Negative for fever and weight loss.    Past Medical  History:  Diagnosis Date   ANXIETY DISORDER, GENERALIZED 08/22/2006   Qualifier: Diagnosis of  By: [name], Jerrell Mangel     Carpal tunnel syndrome, right 05/31/2012   CERVICAL RADICULOPATHY 08/22/2006   Annotation: L arm 2002 Qualifier: Diagnosis of  By: [name], [place] DISEASE 08/22/2006   Qualifier: Diagnosis of  By: [name], Roy Snuffer     GENITAL HERPES 08/22/2006   Qualifier: Diagnosis of  By: [name], Hal Norrington     GERD 07/28/2006   Qualifier: Diagnosis of  By: [name], Cheskel Silverio     GERD (gastroesophageal reflux disease)    HEPATITIS B, CHRONIC 07/28/2006   Qualifier: Diagnosis of  By: [name], Orton Capell      HIV DISEASE 07/28/2006   Annotation: dx 2000 Qualifier: Diagnosis of  By: [name], Kacper Cartlidge     HIV infection [facility])    Hyperlipidemia    Hypertension    HYPERTENSION NEC 07/07/2009   Qualifier: Diagnosis of  By: [name], Malka Bocek     Kidney stones    NEPHROLITHIASIS 08/22/2006   Annotation: x2, last 1992 Qualifier: Diagnosis of  By: [name], Adeliz Tonkinson     Obesity 09/12/2011   PSORIASIS 08/22/2006   Qualifier: Diagnosis of  By: [name], Gawain Crombie      Social History   Tobacco Use   Smoking status: Former    Packs/day: 0.10    Types: Cigarettes    Quit date: 07/18/2012    Years since quitting: 9.6   Smokeless tobacco: Never   Tobacco comments:    quit over cruise over the holidays  Substance Use Topics   Alcohol use: Yes    Alcohol/week: 3.0 - 5.0 standard drinks of alcohol    Types: 3 - 5 Standard drinks or equivalent per week    Comment: occ   Drug use: No    Family History  Problem Relation Age of Onset   Colon cancer Sister    Heart disease Other    Hypertension Other    Arthritis Other    Cancer Other        colon cancer   Diabetes Maternal Aunt    Heart failure Mother     Allergies  Allergen Reactions   Lisinopril Rash    Health Maintenance  Topic Date Due   COVID-19 Vaccine (6 - Pfizer risk series) 07/13/2021   INFLUENZA VACCINE  02/22/2022   COLONOSCOPY (Pts 45-25yr Insurance coverage will need to be confirmed)  04/04/2023   TETANUS/TDAP  02/18/2029   Hepatitis C Screening  Completed   HIV Screening  Completed   Zoster Vaccines- Shingrix  Completed   HPV VACCINES  Aged Out    Objective:  Vitals:   03/24/22 0844  BP: 121/85  Pulse: 84  Resp: 16  SpO2: 98%  Weight: 218 lb (98.9 kg)  Height: 6' (1.829 m)   Body mass index is 29.57 kg/m.  Physical Exam Constitutional:      Comments: His weight is stable.  He is in good spirits.  Cardiovascular:     Rate and Rhythm: Normal rate and regular rhythm.     Heart sounds: No murmur  heard. Pulmonary:     Effort: Pulmonary effort is normal.     Breath sounds: Normal breath sounds.  Psychiatric:        Mood and Affect: Mood normal.     Lab Results Lab Results  Component Value Date   WBC 8.1 03/10/2022   HGB 13.4  03/10/2022   HCT 39.4 03/10/2022   MCV 96.8 03/10/2022   PLT 265 03/10/2022    Lab Results  Component Value Date   CREATININE 0.93 03/10/2022   BUN 21 03/10/2022   NA 140 03/10/2022   K 4.3 03/10/2022   CL 100 03/10/2022   CO2 29 03/10/2022    Lab Results  Component Value Date   ALT 41 03/10/2022   AST 41 (H) 03/10/2022   ALKPHOS 69 07/02/2021   BILITOT 0.7 03/10/2022    Lab Results  Component Value Date   CHOL 159 03/10/2022   HDL 58 03/10/2022   LDLCALC 88 03/10/2022   TRIG 52 03/10/2022   CHOLHDL 2.7 03/10/2022   Lab Results  Component Value Date   LABRPR NON-REACTIVE 03/10/2022   HIV 1 RNA Quant (Copies/mL)  Date Value  03/10/2022 Not Detected  05/04/2021 Not Detected  04/14/2020 <20 (H)   CD4 T Cell Abs (/uL)  Date Value  03/10/2022 714  05/04/2021 487  04/14/2020 465     Problem List Items Addressed This Visit       High   Human immunodeficiency virus (HIV) disease ([place])    His HIV infection remains under excellent, long-term control.  He will continue Symtuza and follow-up after lab work in 1 year.  I recommended that he get his annual flu vaccine and the new COVID-vaccine when it becomes available within the next month.      Relevant Medications   Darunavir-Cobicistat-Emtricitabine-Tenofovir Alafenamide (SYMTUZA) 800-150-200-10 MG TABS   Other Relevant Orders   CBC   T-helper cells (CD4) count (not at [facility])   Comprehensive metabolic panel   Lipid panel   RPR   HIV-1 RNA quant-no reflex-bld   Chronic hepatitis B virus infection ([place])    His hepatitis B viral load remains undetectable at less than 10.  His hepatitis B E antigen is positive again.  He will continue Symtuza and follow-up after lab work in 1  year.      Relevant Medications   Darunavir-Cobicistat-Emtricitabine-Tenofovir Alafenamide (SYMTUZA) 800-150-200-10 MG TABS      Michel Bickers, MD [facility] [place] [facility] [phone] pager   [phone] cell 03/24/2022, 8:57 AM

## 2022-03-24 NOTE — Assessment & Plan Note (Signed)
His HIV infection remains under excellent, long-term control.  He will continue Symtuza and follow-up after lab work in 1 year.  I recommended that he get his annual flu vaccine and the new COVID-vaccine when it becomes available within the next month.

## 2022-03-24 NOTE — Assessment & Plan Note (Signed)
He has done an excellent job managing his obesity.

## 2022-03-24 NOTE — Assessment & Plan Note (Signed)
His hepatitis B viral load remains undetectable at less than 10.  His hepatitis B E antigen is positive again.  He will continue Symtuza and follow-up after lab work in 1 year.

## 2022-04-05 ENCOUNTER — Other Ambulatory Visit: Payer: Self-pay | Admitting: Internal Medicine

## 2022-04-06 ENCOUNTER — Encounter: Payer: Self-pay | Admitting: Internal Medicine

## 2022-04-09 ENCOUNTER — Other Ambulatory Visit: Payer: Self-pay | Admitting: Internal Medicine

## 2022-04-09 NOTE — Telephone Encounter (Signed)
Please refill as per office routine med refill policy (all routine meds to be refilled for 3 mo or monthly (per pt preference) up to one year from last visit, then month to month grace period for 3 mo, then further med refills will have to be denied) ? ?

## 2022-04-11 NOTE — Telephone Encounter (Signed)
Sounds like the insurance may require a PA?    But for my part I gave 90 days only bc pt is DUE FOR OV in dec 2023

## 2022-04-14 ENCOUNTER — Other Ambulatory Visit: Payer: Self-pay | Admitting: Pharmacist

## 2022-04-14 ENCOUNTER — Other Ambulatory Visit (HOSPITAL_COMMUNITY): Payer: Self-pay

## 2022-04-14 ENCOUNTER — Other Ambulatory Visit (INDEPENDENT_AMBULATORY_CARE_PROVIDER_SITE_OTHER): Payer: 59

## 2022-04-14 ENCOUNTER — Ambulatory Visit (INDEPENDENT_AMBULATORY_CARE_PROVIDER_SITE_OTHER): Payer: 59 | Admitting: Internal Medicine

## 2022-04-14 ENCOUNTER — Other Ambulatory Visit: Payer: Self-pay

## 2022-04-14 VITALS — BP 122/64 | HR 64 | Temp 97.7°F | Ht 72.0 in | Wt 218.0 lb

## 2022-04-14 DIAGNOSIS — R739 Hyperglycemia, unspecified: Secondary | ICD-10-CM

## 2022-04-14 DIAGNOSIS — E559 Vitamin D deficiency, unspecified: Secondary | ICD-10-CM | POA: Diagnosis not present

## 2022-04-14 DIAGNOSIS — E7800 Pure hypercholesterolemia, unspecified: Secondary | ICD-10-CM | POA: Diagnosis not present

## 2022-04-14 DIAGNOSIS — I10 Essential (primary) hypertension: Secondary | ICD-10-CM | POA: Diagnosis not present

## 2022-04-14 DIAGNOSIS — E538 Deficiency of other specified B group vitamins: Secondary | ICD-10-CM | POA: Diagnosis not present

## 2022-04-14 DIAGNOSIS — Z125 Encounter for screening for malignant neoplasm of prostate: Secondary | ICD-10-CM

## 2022-04-14 DIAGNOSIS — F411 Generalized anxiety disorder: Secondary | ICD-10-CM | POA: Diagnosis not present

## 2022-04-14 DIAGNOSIS — R9431 Abnormal electrocardiogram [ECG] [EKG]: Secondary | ICD-10-CM

## 2022-04-14 DIAGNOSIS — Z0001 Encounter for general adult medical examination with abnormal findings: Secondary | ICD-10-CM | POA: Diagnosis not present

## 2022-04-14 DIAGNOSIS — Z23 Encounter for immunization: Secondary | ICD-10-CM

## 2022-04-14 DIAGNOSIS — R69 Illness, unspecified: Secondary | ICD-10-CM | POA: Diagnosis not present

## 2022-04-14 DIAGNOSIS — B20 Human immunodeficiency virus [HIV] disease: Secondary | ICD-10-CM

## 2022-04-14 MED ORDER — SYMTUZA 800-150-200-10 MG PO TABS
1.0000 | ORAL_TABLET | Freq: Every day | ORAL | 11 refills | Status: DC
  Filled 2022-04-14: qty 30, 30d supply, fill #0
  Filled 2022-05-11: qty 30, 30d supply, fill #1
  Filled 2022-06-13: qty 30, 30d supply, fill #2
  Filled 2022-07-11: qty 30, 30d supply, fill #3
  Filled 2022-08-09: qty 30, 30d supply, fill #4
  Filled 2022-09-06: qty 30, 30d supply, fill #5
  Filled 2022-10-07: qty 30, 30d supply, fill #6
  Filled 2022-11-07: qty 30, 30d supply, fill #7
  Filled 2022-12-08: qty 30, 30d supply, fill #8
  Filled 2023-01-05: qty 30, 30d supply, fill #9
  Filled 2023-02-03 (×2): qty 30, 30d supply, fill #10
  Filled 2023-03-03: qty 30, 30d supply, fill #11

## 2022-04-14 MED ORDER — PANTOPRAZOLE SODIUM 40 MG PO TBEC: 40.0000 mg | DELAYED_RELEASE_TABLET | Freq: Every day | ORAL | 3 refills | Status: DC

## 2022-04-14 NOTE — Progress Notes (Addendum)
Patient ID: Anthony Steele, male   DOB: 1961-03-22, 61 y.o.   MRN: [id number]        Chief Complaint:: wellness exam and hld, htn, hyperglycemia,low vit d       HPI:  Anthony Steele is a 61 y.o. male here for wellness exam; declines covid booster, o/w up to date                        Also Pt denies chest pain, increased sob or doe, wheezing, orthopnea, PND, increased LE swelling, palpitations, dizziness or syncope.  Willing for Cardiac CT score.   Pt denies polydipsia, polyuria, or new focal neuro s/s.    Pt denies fever, wt loss, night sweats, loss of appetite, or other constitutional symptoms Not taking Vit D     Wt Readings from Last 3 Encounters:  04/14/22 218 lb (98.9 kg)  03/24/22 218 lb (98.9 kg)  07/02/21 219 lb 6.4 oz (99.5 kg)   BP Readings from Last 3 Encounters:  04/14/22 122/64  03/24/22 121/85  07/02/21 120/72   Immunization History  Administered Date(s) Administered   Influenza Split 04/19/2011, 05/31/2012   Influenza Whole 08/22/2006, 05/08/2007, 06/24/2008, 05/27/2010   Influenza,inj,Quad PF,6+ Mos 03/26/2013, 05/13/2014, 06/29/2015, 05/25/2016, 04/06/2017, 04/11/2018, 05/13/2020, 03/23/2021, 04/14/2022   Influenza-Unspecified 04/02/2021   PFIZER(Purple Top)SARS-COV-2 Vaccination 09/28/2019, 10/26/2019, 05/22/2020, 12/04/2020   Pfizer Covid-19 Vaccine Bivalent Booster 100yr & up 05/18/2021   Pneumococcal Conjugate-13 06/18/2013   Pneumococcal Polysaccharide-23 08/21/2006, 09/12/2011   Tdap 05/31/2012, 02/19/2019   Vaccinia,smallpox Monkeypox Vaccine Live,pf 03/08/2021   Zoster Recombinat (Shingrix) 02/25/2020, 04/27/2020  There are no preventive care reminders to display for this patient.    Past Medical History:  Diagnosis Date   ANXIETY DISORDER, GENERALIZED 08/22/2006   Qualifier: Diagnosis of  By: CMegan SalonMD, Giovan Pinsky     Carpal tunnel syndrome, right 05/31/2012   CERVICAL RADICULOPATHY 08/22/2006   Annotation: L arm 2002 Qualifier: Diagnosis of  By: CMegan Salon MD, [place] 08/22/2006   Qualifier: Diagnosis of  By: CMegan SalonMD, Lexey Fletes     GENITAL HERPES 08/22/2006   Qualifier: Diagnosis of  By: CMegan SalonMD, Aracelis Ulrey     GERD 07/28/2006   Qualifier: Diagnosis of  By: CMegan SalonMD, Alen Matheson     GERD (gastroesophageal reflux disease)    HEPATITIS B, CHRONIC 07/28/2006   Qualifier: Diagnosis of  By: CMegan SalonMD, Casee Knepp     HIV DISEASE 07/28/2006   Annotation: dx 2000 Qualifier: Diagnosis of  By: CMegan SalonMD, Augustino Savastano     HIV infection ([facility]    Hyperlipidemia    Hypertension    HYPERTENSION NEC 07/07/2009   Qualifier: Diagnosis of  By: CMegan SalonMD, Hakeem Frazzini     Kidney stones    NEPHROLITHIASIS 08/22/2006   Annotation: x2, last 1992 Qualifier: Diagnosis of  By: CMegan SalonMD, Eydie Wormley     Obesity 09/12/2011   PSORIASIS 08/22/2006   Qualifier: Diagnosis of  By: CMegan SalonMD, Anairis Knick     Past Surgical History:  Procedure Laterality Date   COLONOSCOPY     CYSTECTOMY  2000   cyst removed off buttock cheek    reports that he quit smoking about 9 years ago. His smoking use included cigarettes. He smoked an average of .1 packs per day. He has never used smokeless tobacco. He reports current alcohol use of about 3.0 - 5.0 standard drinks of alcohol per week. He reports that he does not use drugs. family history includes  Arthritis in an other family member; Cancer in an other family member; Colon cancer in his sister; Diabetes in his maternal aunt; Heart disease in an other family member; Heart failure in his mother; Hypertension in an other family member. Allergies  Allergen Reactions   Lisinopril Rash   Current Outpatient Medications on File Prior to Visit  Medication Sig Dispense Refill   aspirin 81 MG tablet Take 81 mg by mouth daily.     atorvastatin (LIPITOR) 20 MG tablet TAKE 1 TABLET (20 MG TOTAL) BY MOUTH DAILY. ANNUAL APPT IS DUE MUST SEE PROVIDER FOR FUTURE REFILLS 30 tablet 0   augmented betamethasone dipropionate (DIPROLENE-AF) 0.05 % cream APPLY TO  AFFECTED AREA UP TO TWICE A DAY AS NEEDED( NOT TO FACE,GROIN OR UNDERASRMS )     diclofenac (VOLTAREN) 50 MG EC tablet TAKE 1 TABLET BY MOUTH TWICE A DAY AS NEEDED 180 tablet 0   diclofenac Sodium (VOLTAREN) 1 % GEL APPLY 4 G TOPICALLY 4 (FOUR) TIMES DAILY AS NEEDED. 400 g 3   podofilox (CONDYLOX) 0.5 % gel Apply topically 2 (two) times daily. 3.5 g 2   valACYclovir (VALTREX) 500 MG tablet TAKE 1 TABLET BY MOUTH TWICE A DAY 180 tablet 1   No current facility-administered medications on file prior to visit.        ROS:  All others reviewed and negative.  Objective        PE:  BP 122/64 (BP Location: Right Arm, Patient Position: Sitting, Cuff Size: Large)   Pulse 64   Temp 97.7 F (36.5 C) (Oral)   Ht 6' (1.829 m)   Wt 218 lb (98.9 kg)   SpO2 93%   BMI 29.57 kg/m                 Constitutional: Pt appears in NAD               HENT: Head: NCAT.                Right Ear: External ear normal.                 Left Ear: External ear normal.                Eyes: . Pupils are equal, round, and reactive to light. Conjunctivae and EOM are normal               Nose: without d/c or deformity               Neck: Neck supple. Gross normal ROM               Cardiovascular: Normal rate and regular rhythm.                 Pulmonary/Chest: Effort normal and breath sounds without rales or wheezing.                Abd:  Soft, NT, ND, + BS, no organomegaly               Neurological: Pt is alert. At baseline orientation, motor grossly intact               Skin: Skin is warm. No rashes, no other new lesions, LE edema - none               Psychiatric: Pt behavior is normal without agitation   Micro: none  Cardiac tracings I have personally interpreted today:  none  Pertinent  Radiological findings (summarize): none   Lab Results  Component Value Date   WBC 8.1 03/10/2022   HGB 13.4 03/10/2022   HCT 39.4 03/10/2022   PLT 265 03/10/2022   GLUCOSE 85 03/10/2022   CHOL 159 03/10/2022   TRIG 52  03/10/2022   HDL 58 03/10/2022   LDLCALC 88 03/10/2022   ALT 41 03/10/2022   AST 41 (H) 03/10/2022   NA 140 03/10/2022   K 4.3 03/10/2022   CL 100 03/10/2022   CREATININE 0.93 03/10/2022   BUN 21 03/10/2022   CO2 29 03/10/2022   TSH 3.82 04/14/2022   PSA 0.20 04/14/2022   HGBA1C 5.5 03/23/2021   Assessment/Plan:  Anthony Steele is a 61 y.o. White or Caucasian [1] male with  has a past medical history of ANXIETY DISORDER, GENERALIZED (08/22/2006), Carpal tunnel syndrome, right (05/31/2012), CERVICAL RADICULOPATHY (08/22/2006), DEGENERATIVE DISC DISEASE (08/22/2006), GENITAL HERPES (08/22/2006), GERD (07/28/2006), GERD (gastroesophageal reflux disease), HEPATITIS B, CHRONIC (07/28/2006), HIV DISEASE (07/28/2006), HIV infection ([place]), Hyperlipidemia, Hypertension, HYPERTENSION NEC (07/07/2009), Kidney stones, NEPHROLITHIASIS (08/22/2006), Obesity (09/12/2011), and PSORIASIS (08/22/2006).  Vitamin D deficiency Last vitamin D Lab Results  Component Value Date   VD25OH 33.66 03/23/2021   Low, reminded to start oral replacement   Encounter for well adult exam with abnormal findings Age and sex appropriate education and counseling updated with regular exercise and diet Referrals for preventative services - none needed Immunizations addressed - declines covid booster, for flu shot today Smoking counseling  - none needed Evidence for depression or other mood disorder -chronic anxiety stable Most recent labs reviewed. I have personally reviewed and have noted: 1) the patient's medical and social history 2) The patient's current medications and supplements 3) The patient's height, weight, and BMI have been recorded in the chart   ANXIETY DISORDER, GENERALIZED Stable, no need for increased tx or referral,  to f/u any worsening symptoms or concerns  Essential hypertension BP Readings from Last 3 Encounters:  04/14/22 122/64  03/24/22 121/85  07/02/21 120/72   Stable, pt to continue medical  treatment  - low salt diet, exercise, wt control   Hyperglycemia Lab Results  Component Value Date   HGBA1C 5.5 03/23/2021   Stable, pt to continue current medical treatment  - diet, wt control, excercise   Hyperlipidemia Lab Results  Component Value Date   LDLCALC 88 03/10/2022   Stable with current goal ldl < 100,, pt to continue current statin lipitor 20 mg, also for Cardiac CT Score  Followup: Return in about 1 year (around 04/15/2023).  Cathlean Cower, MD 04/16/2022 9:18 PM [facility]

## 2022-04-14 NOTE — Assessment & Plan Note (Signed)
Last vitamin D Lab Results  Component Value Date   VD25OH 33.66 03/23/2021   Low, reminded to start oral replacement

## 2022-04-14 NOTE — Telephone Encounter (Signed)
Canc[facility] and sent to [facility] per patient request.   Beryle Flock, RN

## 2022-04-14 NOTE — Patient Instructions (Addendum)
You had the flu shot today  You will be contacted regarding the referral for: Cardiac CT score  Please continue all other medications as before, and refills have been done if requested.  Please have the pharmacy call with any other refills you may need.  Please continue your efforts at being more active, low cholesterol diet, and weight control.  You are otherwise up to date with prevention measures today.  Please keep your appointments with your specialists as you may have planned  Please go to the LAB at the blood drawing area for the tests to be done  You will be contacted by phone if any changes need to be made immediately.  Otherwise, you will receive a letter about your results with an explanation, but please check with MyChart first.  Please remember to sign up for MyChart if you have not done so, as this will be important to you in the future with finding out test results, communicating by private email, and scheduling acute appointments online when needed.  Please make an Appointment to return for your 1 year visit, or sooner if needed, with Lab testing by Appointment as well, to be done about 3-5 days before at the [facility] (so this is for TWO appointments - please see the scheduling desk as you leave)

## 2022-04-14 NOTE — Telephone Encounter (Signed)
Need to confirm pharmacy.   Beryle Flock, RN

## 2022-04-15 LAB — PSA: PSA: 0.2 ng/mL (ref 0.10–4.00)

## 2022-04-15 LAB — TSH: TSH: 3.82 u[IU]/mL (ref 0.35–5.50)

## 2022-04-15 LAB — VITAMIN B12: Vitamin B-12: 500 pg/mL (ref 211–911)

## 2022-04-15 LAB — VITAMIN D 25 HYDROXY (VIT D DEFICIENCY, FRACTURES): VITD: 36.8 ng/mL (ref 30.00–100.00)

## 2022-04-16 ENCOUNTER — Encounter: Payer: Self-pay | Admitting: Internal Medicine

## 2022-04-16 NOTE — Assessment & Plan Note (Signed)
BP Readings from Last 3 Encounters:  04/14/22 122/64  03/24/22 121/85  07/02/21 120/72   Stable, pt to continue medical treatment  - low salt diet, exercise, wt control

## 2022-04-16 NOTE — Assessment & Plan Note (Signed)
Stable, no need for increased tx or referral,  to f/u any worsening symptoms or concerns

## 2022-04-16 NOTE — Assessment & Plan Note (Addendum)
Age and sex appropriate education and counseling updated with regular exercise and diet Referrals for preventative services - none needed Immunizations addressed - declines covid booster, for flu shot today Smoking counseling  - none needed Evidence for depression or other mood disorder -chronic anxiety stable Most recent labs reviewed. I have personally reviewed and have noted: 1) the patient's medical and social history 2) The patient's current medications and supplements 3) The patient's height, weight, and BMI have been recorded in the chart

## 2022-04-16 NOTE — Assessment & Plan Note (Signed)
Lab Results  Component Value Date   HGBA1C 5.5 03/23/2021   Stable, pt to continue current medical treatment  - diet, wt control, excercise

## 2022-04-16 NOTE — Assessment & Plan Note (Signed)
Lab Results  Component Value Date   LDLCALC 88 03/10/2022   Stable with current goal ldl < 100,, pt to continue current statin lipitor 20 mg, also for Cardiac CT Score

## 2022-04-18 ENCOUNTER — Other Ambulatory Visit (HOSPITAL_COMMUNITY): Payer: Self-pay

## 2022-04-18 LAB — HEMOGLOBIN A1C: Hgb A1c MFr Bld: 5.3 % (ref 4.6–6.5)

## 2022-04-19 ENCOUNTER — Other Ambulatory Visit (HOSPITAL_COMMUNITY): Payer: Self-pay

## 2022-04-29 ENCOUNTER — Other Ambulatory Visit: Payer: Self-pay | Admitting: Internal Medicine

## 2022-04-29 ENCOUNTER — Ambulatory Visit (HOSPITAL_COMMUNITY)
Admission: RE | Admit: 2022-04-29 | Discharge: 2022-04-29 | Disposition: A | Discharge status: Home / Self Care | Payer: 59 | Source: Ambulatory Visit | Attending: Internal Medicine | Admitting: Internal Medicine

## 2022-04-29 DIAGNOSIS — R739 Hyperglycemia, unspecified: Secondary | ICD-10-CM | POA: Insufficient documentation

## 2022-04-29 DIAGNOSIS — E7800 Pure hypercholesterolemia, unspecified: Secondary | ICD-10-CM | POA: Insufficient documentation

## 2022-04-29 DIAGNOSIS — R931 Abnormal findings on diagnostic imaging of heart and coronary circulation: Secondary | ICD-10-CM

## 2022-04-29 DIAGNOSIS — R9431 Abnormal electrocardiogram [ECG] [EKG]: Secondary | ICD-10-CM | POA: Insufficient documentation

## 2022-04-29 DIAGNOSIS — I10 Essential (primary) hypertension: Secondary | ICD-10-CM | POA: Insufficient documentation

## 2022-05-03 ENCOUNTER — Other Ambulatory Visit: Payer: Self-pay | Admitting: Internal Medicine

## 2022-05-03 NOTE — Telephone Encounter (Signed)
Please refill as per office routine med refill policy (all routine meds to be refilled for 3 mo or monthly (per pt preference) up to one year from last visit, then month to month grace period for 3 mo, then further med refills will have to be denied) ? ?

## 2022-05-10 ENCOUNTER — Other Ambulatory Visit (HOSPITAL_COMMUNITY): Payer: Self-pay

## 2022-05-11 ENCOUNTER — Other Ambulatory Visit (HOSPITAL_COMMUNITY): Payer: Self-pay

## 2022-05-13 ENCOUNTER — Other Ambulatory Visit (HOSPITAL_COMMUNITY): Payer: Self-pay

## 2022-05-19 ENCOUNTER — Other Ambulatory Visit: Payer: Self-pay | Admitting: Internal Medicine

## 2022-05-27 ENCOUNTER — Ambulatory Visit: Payer: Self-pay

## 2022-05-27 NOTE — Patient Outreach (Signed)
  Care Coordination   05/27/2022 Name: Anthony Steele MRN: [id number] DOB: [birth year]   Care Coordination Outreach Attempts:  An unsuccessful telephone outreach was attempted today to offer the patient information about available care coordination services as a benefit of their health plan.   Follow Up Plan:  Additional outreach attempts will be made to offer the patient care coordination information and services.   Encounter Outcome:  No Answer  Care Coordination Interventions Activated:  No   Care Coordination Interventions:  No, not indicated    Daneen Schick, BSW, CDP Social Worker, Certified Dementia Practitioner [facility] Care Management  Care Coordination [phone]

## 2022-05-30 ENCOUNTER — Encounter: Payer: Self-pay | Admitting: Cardiovascular Disease

## 2022-05-30 NOTE — Progress Notes (Unsigned)
Cardiology Office Note:    Date:  05/31/2022   ID:  Anthony Steele, DOB Oct 26, 1960, MRN [id number]  PCP:  Biagio Borg, MD   [facility] Cardiologist: Alper Guilmette    Referring MD: Biagio Borg, MD   Chief Complaint  Patient presents with   Leg Swelling    History of Present Illness:    Anthony Steele is a 61 y.o. male with a hx of HTN, HLD,   Recent coronary calcium score revealed Coronary calcium score of 623. This was 63 rd percentile for age and sex matched control.  No CP, no dyspnea,  no syncope  O2 fitness 3-4 times a week.     Fam hx  Mother has CABG, CHF, stents Father had TAVR.    Hx of HLD , atorvastatin 20 mg   Works in Community education [employer]     Past Medical History:  Diagnosis Date   ANXIETY DISORDER, GENERALIZED 08/22/2006   Qualifier: Diagnosis of  By: [name], [name]     Carpal tunnel syndrome, right 05/31/2012   CERVICAL RADICULOPATHY 08/22/2006   Annotation: L arm 2002 Qualifier: Diagnosis of  By: [name], [place] DISEASE 08/22/2006   Qualifier: Diagnosis of  By: [name], [name]     GENITAL HERPES 08/22/2006   Qualifier: Diagnosis of  By: [name], [name]     GERD 07/28/2006   Qualifier: Diagnosis of  By: [name], [name]     GERD (gastroesophageal reflux disease)    HEPATITIS B, CHRONIC 07/28/2006   Qualifier: Diagnosis of  By: [name], [name]     HIV DISEASE 07/28/2006   Annotation: dx 2000 Qualifier: Diagnosis of  By: [name], [name]     HIV infection [facility])    Hyperlipidemia    Hypertension    HYPERTENSION NEC 07/07/2009   Qualifier: Diagnosis of  By: [name], [name]     Kidney stones    NEPHROLITHIASIS 08/22/2006   Annotation: x2, last 1992 Qualifier: Diagnosis of  By: [name], [name]     Obesity 09/12/2011   PSORIASIS 08/22/2006   Qualifier: Diagnosis of  By: [name], [name]      Past Surgical History:  Procedure Laterality Date   COLONOSCOPY     CYSTECTOMY  2000   cyst removed off  buttock cheek    Current Medications: Current Meds  Medication Sig   aspirin 81 MG tablet Take 81 mg by mouth daily.   Darunavir-Cobicistat-Emtricitabine-Tenofovir Alafenamide (SYMTUZA) 800-150-200-10 MG TABS Take 1 tablet by mouth daily with breakfast.   diclofenac (VOLTAREN) 50 MG EC tablet TAKE 1 TABLET BY MOUTH TWICE A DAY AS NEEDED   diclofenac Sodium (VOLTAREN) 1 % GEL APPLY 4 G TOPICALLY 4 (FOUR) TIMES DAILY AS NEEDED.   rosuvastatin (CRESTOR) 20 MG tablet Take 1 tablet (20 mg total) by mouth daily.   valACYclovir (VALTREX) 500 MG tablet TAKE 1 TABLET BY MOUTH TWICE A DAY   [DISCONTINUED] atorvastatin (LIPITOR) 20 MG tablet TAKE 1 TABLET (20 MG TOTAL) BY MOUTH DAILY. ANNUAL APPT IS DUE MUST SEE PROVIDER FOR FUTURE REFILLS     Allergies:   Lisinopril   Social History   Socioeconomic History   Marital status: Single    Spouse name: Not on file   Number of children: Not on file   Years of education: Not on file   Highest education level: Not on file  Occupational History   Not on file  Tobacco Use  Smoking status: Former    Packs/day: 0.10    Types: Cigarettes    Quit date: 07/18/2012    Years since quitting: 9.8   Smokeless tobacco: Never   Tobacco comments:    quit over cruise over the holidays  Substance and Sexual Activity   Alcohol use: Yes    Alcohol/week: 3.0 - 5.0 standard drinks of alcohol    Types: 3 - 5 Standard drinks or equivalent per week    Comment: occ   Drug use: No   Sexual activity: Not Currently    Comment: decline condoms  Other Topics Concern   Not on file  Social History Narrative   Not on file   Social Determinants of Health   Financial Resource Strain: Not on file  Food Insecurity: Not on file  Transportation Needs: Not on file  Physical Activity: Not on file  Stress: Not on file  Social Connections: Not on file     Family History: The patient's family history includes Arthritis in an other family member; Cancer in an other  family member; Colon cancer in his sister; Diabetes in his maternal aunt; Heart disease in an other family member; Heart failure in his mother; Hypertension in an other family member.  ROS:   Please see the history of present illness.     All other systems reviewed and are negative.  EKGs/Labs/Other Studies Reviewed:    The following studies were reviewed today:   EKG:     Nov. 7, 2023: Normal sinus rhythm at 65.  Normal EKG.  Recent Labs: 07/02/2021: Pro B Natriuretic peptide (BNP) 25.0 03/10/2022: ALT 41; BUN 21; Creat 0.93; Hemoglobin 13.4; Platelets 265; Potassium 4.3; Sodium 140 04/14/2022: TSH 3.82  Recent Lipid Panel    Component Value Date/Time   CHOL 159 03/10/2022 1521   TRIG 52 03/10/2022 1521   HDL 58 03/10/2022 1521   CHOLHDL 2.7 03/10/2022 1521   VLDL 14.2 03/23/2021 1000   LDLCALC 88 03/10/2022 1521     Risk Assessment/Calculations:                Physical Exam:    VS:  BP 126/86   Pulse 65   Ht 6' (1.829 m)   Wt 220 lb 6.4 oz (100 kg)   SpO2 97%   BMI 29.89 kg/m     Wt Readings from Last 3 Encounters:  05/31/22 220 lb 6.4 oz (100 kg)  04/14/22 218 lb (98.9 kg)  03/24/22 218 lb (98.9 kg)     GEN:  Well nourished, well developed in no acute distress HEENT: Normal NECK: No JVD; No carotid bruits LYMPHATICS: No lymphadenopathy CARDIAC: RRR, no murmurs, rubs, gallops RESPIRATORY:  Clear to auscultation without rales, wheezing or rhonchi  ABDOMEN: Soft, non-tender, non-distended MUSCULOSKELETAL:  No edema; No deformity  SKIN: Warm and dry NEUROLOGIC:  Alert and oriented x 3 PSYCHIATRIC:  Normal affect   ASSESSMENT:    1. Coronary artery calcification   2. Mixed hyperlipidemia   3. Family history of coronary artery disease   4. Medication management    PLAN:    In order of problems listed above:  Coronary calcification: Anthony Steele is a 61 year old gentleman with a history of hyperlipidemia and a family history of coronary artery disease.   Was found to have a coronary calcium score of 623.  This places him in the 93rd percentile for age and sex matched controls. His last LDL is 88.  Given his significant coronary artery calcification, his LDL goal  should be 55 or lower.  We will discontinue the atorvastatin.  Start him on rosuvastatin 20 mg a day.  We will check labs in 3 months.  We will set her adding Zetia if he is not at goal on the rosuvastatin.  We will consider referral to the lipid clinic for consideration of a PCSK9 if he still is not to goal after the addition of Zetia.           Medication Adjustments/Labs and Tests Ordered: Current medicines are reviewed at length with the patient today.  Concerns regarding medicines are outlined above.  Orders Placed This Encounter  Procedures   Lipid panel   ALT   Basic metabolic panel   EKG 81-EHUD   Meds ordered this encounter  Medications   rosuvastatin (CRESTOR) 20 MG tablet    Sig: Take 1 tablet (20 mg total) by mouth daily.    Dispense:  90 tablet    Refill:  3    Patient Instructions  Medication Instructions:  STOP Atorvastatin START Rosuvastatin '20mg'$  daily *If you need a refill on your cardiac medications before your next appointment, please call your pharmacy*   Lab Work: Lipids, ALT, BMET in 3 months If you have labs (blood work) drawn today and your tests are completely normal, you will receive your results only by: [place] (if you have MyChart) OR A paper copy in the mail If you have any lab test that is abnormal or we need to change your treatment, we will call you to review the results.   Testing/Procedures: NONE   Follow-Up: At [facility], you and your health needs are our priority.  As part of our continuing mission to provide you with exceptional heart care, we have created designated Provider Care Teams.  These Care Teams include your primary Cardiologist (physician) and Advanced Practice Providers (APPs -  Physician  Assistants and Nurse Practitioners) who all work together to provide you with the care you need, when you need it.  Your next appointment:   1 year(s)  The format for your next appointment:   In Person  Provider:   Mertie Moores, MD    Important Information About Sugar         Signed, Mertie Moores, MD  05/31/2022 5:45 PM    [facility]

## 2022-05-31 ENCOUNTER — Ambulatory Visit: Payer: 59 | Attending: Cardiovascular Disease | Admitting: Cardiovascular Disease

## 2022-05-31 ENCOUNTER — Encounter: Payer: Self-pay | Admitting: Cardiovascular Disease

## 2022-05-31 VITALS — BP 126/86 | HR 65 | Ht 72.0 in | Wt 220.4 lb

## 2022-05-31 DIAGNOSIS — I2510 Atherosclerotic heart disease of native coronary artery without angina pectoris: Secondary | ICD-10-CM | POA: Diagnosis not present

## 2022-05-31 DIAGNOSIS — Z8249 Family history of ischemic heart disease and other diseases of the circulatory system: Secondary | ICD-10-CM | POA: Diagnosis not present

## 2022-05-31 DIAGNOSIS — I2584 Coronary atherosclerosis due to calcified coronary lesion: Secondary | ICD-10-CM | POA: Diagnosis not present

## 2022-05-31 DIAGNOSIS — Z79899 Other long term (current) drug therapy: Secondary | ICD-10-CM | POA: Diagnosis not present

## 2022-05-31 DIAGNOSIS — E782 Mixed hyperlipidemia: Secondary | ICD-10-CM

## 2022-05-31 MED ORDER — ROSUVASTATIN CALCIUM 20 MG PO TABS: 20.0000 mg | ORAL_TABLET | Freq: Every day | ORAL | 3 refills | Status: DC

## 2022-05-31 NOTE — Patient Instructions (Signed)
Medication Instructions:  STOP Atorvastatin START Rosuvastatin '20mg'$  daily *If you need a refill on your cardiac medications before your next appointment, please call your pharmacy*   Lab Work: Lipids, ALT, BMET in 3 months If you have labs (blood work) drawn today and your tests are completely normal, you will receive your results only by: [place] (if you have MyChart) OR A paper copy in the mail If you have any lab test that is abnormal or we need to change your treatment, we will call you to review the results.   Testing/Procedures: NONE   Follow-Up: At [facility], you and your health needs are our priority.  As part of our continuing mission to provide you with exceptional heart care, we have created designated Provider Care Teams.  These Care Teams include your primary Cardiologist (physician) and Advanced Practice Providers (APPs -  Physician Assistants and Nurse Practitioners) who all work together to provide you with the care you need, when you need it.  Your next appointment:   1 year(s)  The format for your next appointment:   In Person  Provider:   Mertie Moores, MD    Important Information About Sugar

## 2022-06-07 ENCOUNTER — Other Ambulatory Visit (HOSPITAL_COMMUNITY): Payer: Self-pay

## 2022-06-10 ENCOUNTER — Other Ambulatory Visit (HOSPITAL_COMMUNITY): Payer: Self-pay

## 2022-06-13 ENCOUNTER — Other Ambulatory Visit (HOSPITAL_COMMUNITY): Payer: Self-pay

## 2022-06-15 ENCOUNTER — Other Ambulatory Visit (HOSPITAL_COMMUNITY): Payer: Self-pay

## 2022-06-17 ENCOUNTER — Other Ambulatory Visit (HOSPITAL_COMMUNITY): Payer: Self-pay

## 2022-07-01 ENCOUNTER — Telehealth: Payer: 59 | Admitting: Family Medicine

## 2022-07-01 DIAGNOSIS — B9689 Other specified bacterial agents as the cause of diseases classified elsewhere: Secondary | ICD-10-CM | POA: Diagnosis not present

## 2022-07-01 DIAGNOSIS — J019 Acute sinusitis, unspecified: Secondary | ICD-10-CM | POA: Diagnosis not present

## 2022-07-01 MED ORDER — AMOXICILLIN-POT CLAVULANATE 875-125 MG PO TABS: 1.0000 | ORAL_TABLET | Freq: Two times a day (BID) | ORAL | 0 refills | Status: AC

## 2022-07-01 NOTE — Progress Notes (Signed)

## 2022-07-07 ENCOUNTER — Other Ambulatory Visit (HOSPITAL_COMMUNITY): Payer: Self-pay

## 2022-07-11 ENCOUNTER — Other Ambulatory Visit: Payer: Self-pay

## 2022-07-12 ENCOUNTER — Other Ambulatory Visit: Payer: Self-pay

## 2022-07-12 ENCOUNTER — Other Ambulatory Visit (HOSPITAL_COMMUNITY): Payer: Self-pay

## 2022-07-14 ENCOUNTER — Other Ambulatory Visit: Payer: Self-pay | Admitting: Internal Medicine

## 2022-08-08 ENCOUNTER — Other Ambulatory Visit (HOSPITAL_COMMUNITY): Payer: Self-pay

## 2022-08-09 ENCOUNTER — Other Ambulatory Visit: Payer: Self-pay

## 2022-08-09 ENCOUNTER — Ambulatory Visit (INDEPENDENT_AMBULATORY_CARE_PROVIDER_SITE_OTHER): Payer: 59

## 2022-08-09 ENCOUNTER — Other Ambulatory Visit (HOSPITAL_COMMUNITY): Payer: Self-pay

## 2022-08-09 DIAGNOSIS — Z23 Encounter for immunization: Secondary | ICD-10-CM | POA: Diagnosis not present

## 2022-08-12 ENCOUNTER — Other Ambulatory Visit (HOSPITAL_COMMUNITY): Payer: Self-pay

## 2022-08-26 ENCOUNTER — Telehealth: Payer: Self-pay | Admitting: *Deleted

## 2022-08-26 NOTE — Patient Outreach (Signed)
  Care Coordination   08/26/2022 Name: Anthony Steele MRN: [id number] DOB: 1961-06-27   Care Coordination Outreach Attempts:  An unsuccessful telephone outreach was attempted today to offer the patient information about available care coordination services as a benefit of their health plan.   Follow Up Plan:  Additional outreach attempts will be made to offer the patient care coordination information and services.   Encounter Outcome:  No Answer   Care Coordination Interventions:  No, not indicated    Raina Mina, RN Care Management Coordinator [place] Office [phone]

## 2022-08-31 ENCOUNTER — Ambulatory Visit: Payer: 59 | Attending: Cardiovascular Disease

## 2022-08-31 DIAGNOSIS — Z8249 Family history of ischemic heart disease and other diseases of the circulatory system: Secondary | ICD-10-CM | POA: Diagnosis not present

## 2022-08-31 DIAGNOSIS — I2510 Atherosclerotic heart disease of native coronary artery without angina pectoris: Secondary | ICD-10-CM

## 2022-08-31 DIAGNOSIS — Z79899 Other long term (current) drug therapy: Secondary | ICD-10-CM

## 2022-08-31 DIAGNOSIS — E782 Mixed hyperlipidemia: Secondary | ICD-10-CM

## 2022-08-31 DIAGNOSIS — I2584 Coronary atherosclerosis due to calcified coronary lesion: Secondary | ICD-10-CM | POA: Diagnosis not present

## 2022-08-31 LAB — BASIC METABOLIC PANEL
BUN/Creatinine Ratio: 25 — ABNORMAL HIGH (ref 10–24)
BUN: 23 mg/dL (ref 8–27)
CO2: 23 mmol/L (ref 20–29)
Calcium: 9.5 mg/dL (ref 8.6–10.2)
Chloride: 106 mmol/L (ref 96–106)
Creatinine, Ser: 0.91 mg/dL (ref 0.76–1.27)
Glucose: 131 mg/dL — ABNORMAL HIGH (ref 70–99)
Potassium: 4.7 mmol/L (ref 3.5–5.2)
Sodium: 141 mmol/L (ref 134–144)
eGFR: 96 mL/min/{1.73_m2} (ref >59)

## 2022-08-31 LAB — LIPID PANEL
Chol/HDL Ratio: 2.8 ratio (ref 0.0–5.0)
Cholesterol, Total: 141 mg/dL (ref 100–199)
HDL: 51 mg/dL (ref >39)
LDL Chol Calc (NIH): 80 mg/dL (ref 0–99)
Triglycerides: 44 mg/dL (ref 0–149)
VLDL Cholesterol Cal: 10 mg/dL (ref 5–40)

## 2022-08-31 LAB — ALT: ALT: 46 IU/L — ABNORMAL HIGH (ref 0–44)

## 2022-09-01 ENCOUNTER — Other Ambulatory Visit (HOSPITAL_COMMUNITY): Payer: Self-pay

## 2022-09-02 ENCOUNTER — Other Ambulatory Visit: Payer: Self-pay

## 2022-09-02 ENCOUNTER — Encounter: Payer: Self-pay | Admitting: Internal Medicine

## 2022-09-02 ENCOUNTER — Encounter: Payer: Self-pay | Admitting: Cardiovascular Disease

## 2022-09-02 DIAGNOSIS — E782 Mixed hyperlipidemia: Secondary | ICD-10-CM

## 2022-09-02 DIAGNOSIS — Z79899 Other long term (current) drug therapy: Secondary | ICD-10-CM

## 2022-09-02 NOTE — Progress Notes (Signed)
Change of pharmacy

## 2022-09-06 ENCOUNTER — Other Ambulatory Visit (HOSPITAL_COMMUNITY): Payer: Self-pay

## 2022-09-06 MED ORDER — EZETIMIBE 10 MG PO TABS: 10.0000 mg | ORAL_TABLET | Freq: Every day | ORAL | 3 refills | Status: DC

## 2022-09-06 NOTE — Telephone Encounter (Signed)
Nahser, Wonda Cheng, MD  Donnalee Curry K Coronary calcium score of 623. This was 8 rd percentile for age and sex matched controls LDL is 20 .   I would like for his LDL to be 50-70. Add zetia 10 mg a day Recheck lipids , ALT in 3 months   Patient sent message via MyChart that he is okay with this plan. [facility] per request. Labs entered and scheduled for 12/05/22.

## 2022-09-08 ENCOUNTER — Other Ambulatory Visit: Payer: Self-pay | Admitting: Internal Medicine

## 2022-09-08 DIAGNOSIS — A6000 Herpesviral infection of urogenital system, unspecified: Secondary | ICD-10-CM

## 2022-09-12 ENCOUNTER — Other Ambulatory Visit: Payer: Self-pay

## 2022-09-12 NOTE — Telephone Encounter (Signed)
Returned call to patient to let him know that none of the effects he's experiencing are typical of Zetia. Advised that the facial itching seems it could be an allergic reaction, but the dizziness/falling sensation seems unrelated. Questioned his BP, but he hasn't checked it. States he walked into the refrigerator at work today-just things that seem strange to him. He will check his BP-used to be hypertensive, but lost weight and no longer takes medication. Mentions that his bilateral fingers feel cold, but able to bend, move, no parasthesias. Speaking clearly (so slurring) and full sentences. Advised that he stop the zetia for 1-2 weeks to see if he has improvement in symptoms. After the break, he plans to re-trial to see if same symptoms recur and will let us know the outcome so chart can be updated accordingly.

## 2022-09-23 ENCOUNTER — Encounter (INDEPENDENT_AMBULATORY_CARE_PROVIDER_SITE_OTHER): Payer: Self-pay | Admitting: *Deleted

## 2022-09-23 ENCOUNTER — Other Ambulatory Visit: Payer: Self-pay

## 2022-09-23 DIAGNOSIS — Z006 Encounter for examination for normal comparison and control in clinical research program: Secondary | ICD-10-CM

## 2022-09-23 NOTE — Research (Signed)
[name] was here for screening visit 1 for the A5394 Hep B treatment study. He had reviewed the consent prior to the visit and we reviewed here with him at the visit. Informed consent was obtained. He was diagnosed with HIV in 2000 and he's not sure when the Hep B started, but knows he was positive in 2008. He is currently taking Symtuza.The hep B surface antigen quantitative was drawn to determine if we can proceed with screening visit #2.

## 2022-10-04 ENCOUNTER — Other Ambulatory Visit (HOSPITAL_COMMUNITY): Payer: Self-pay

## 2022-10-05 ENCOUNTER — Encounter: Payer: Self-pay | Admitting: Internal Medicine

## 2022-10-07 ENCOUNTER — Other Ambulatory Visit (HOSPITAL_COMMUNITY): Payer: Self-pay

## 2022-10-07 ENCOUNTER — Encounter: Payer: Self-pay | Admitting: Internal Medicine

## 2022-10-07 ENCOUNTER — Ambulatory Visit: Payer: 59 | Admitting: Internal Medicine

## 2022-10-07 VITALS — Temp 98.5°F | Ht 72.0 in | Wt 226.0 lb

## 2022-10-07 DIAGNOSIS — J309 Allergic rhinitis, unspecified: Secondary | ICD-10-CM

## 2022-10-07 DIAGNOSIS — E559 Vitamin D deficiency, unspecified: Secondary | ICD-10-CM | POA: Diagnosis not present

## 2022-10-07 DIAGNOSIS — T7840XA Allergy, unspecified, initial encounter: Secondary | ICD-10-CM | POA: Diagnosis not present

## 2022-10-07 DIAGNOSIS — R42 Dizziness and giddiness: Secondary | ICD-10-CM

## 2022-10-07 DIAGNOSIS — E782 Mixed hyperlipidemia: Secondary | ICD-10-CM

## 2022-10-07 MED ORDER — METHYLPREDNISOLONE ACETATE 80 MG/ML IJ SUSP
80.0000 mg | Freq: Once | INTRAMUSCULAR | Status: AC
  Administered 2022-10-07: 80 mg via INTRAMUSCULAR

## 2022-10-07 MED ORDER — FEXOFENADINE HCL 180 MG PO TABS: 180.0000 mg | ORAL_TABLET | Freq: Every day | ORAL | 2 refills | Status: DC

## 2022-10-07 MED ORDER — PREDNISONE 10 MG PO TABS: ORAL_TABLET | ORAL | 0 refills | Status: DC

## 2022-10-07 MED ORDER — TRIAMCINOLONE ACETONIDE 55 MCG/ACT NA AERO: 2.0000 | INHALATION_SPRAY | Freq: Every day | NASAL | 12 refills | Status: DC

## 2022-10-07 NOTE — Patient Instructions (Signed)
You had the steroid shot today  Please take all new medication as prescribed - the short low dose prednisone  Please take all new medication as prescribed - the allegra and nasacort  You can also take Delsym OTC for cough, and/or Mucinex (or it's generic off brand) for congestion, and tylenol as needed for pain  Please continue all other medications as before, and refills have been done if requested.  Please have the pharmacy call with any other refills you may need.  Please keep your appointments with your specialists as you may have planned

## 2022-10-07 NOTE — Progress Notes (Signed)
Patient ID: Anthony Steele, male   DOB: 01-May-1961, 62 y.o.   MRN: [id number]        Chief Complaint: follow up dizziness, allergies, hld       HPI:  Anthony Steele is a 62 y.o. male here with c/o dizziness and head pressure, itchy face and nasal congestion, ear feeling full, and mild recurring dizziness x 3 wks despite stopping zetia that he though might be related.  No significant prior hx of allergies.  Not orthostatic on challenge today.  Pt denies chest pain, increased sob or doe, wheezing, orthopnea, PND, increased LE swelling, palpitations, or syncope.   Pt denies polydipsia, polyuria, or new focal neuro s/s.    Pt denies fever, wt loss, night sweats, loss of appetite, or other constitutional symptoms  No ST or cough       Wt Readings from Last 3 Encounters:  10/07/22 226 lb (102.5 kg)  05/31/22 220 lb 6.4 oz (100 kg)  04/14/22 218 lb (98.9 kg)   BP Readings from Last 3 Encounters:  05/31/22 126/86  04/14/22 122/64  03/24/22 121/85         Past Medical History:  Diagnosis Date   ANXIETY DISORDER, GENERALIZED 08/22/2006   Qualifier: Diagnosis of  By: Orvan Falconer MD, Keirstyn Aydt     Carpal tunnel syndrome, right 05/31/2012   CERVICAL RADICULOPATHY 08/22/2006   Annotation: L arm 2002 Qualifier: Diagnosis of  By: Orvan Falconer MD, Francois Elk     DEGENERATIVE DISC DISEASE 08/22/2006   Qualifier: Diagnosis of  By: Orvan Falconer MD, Alton Tremblay     GENITAL HERPES 08/22/2006   Qualifier: Diagnosis of  By: Orvan Falconer MD, Carlus Stay     GERD 07/28/2006   Qualifier: Diagnosis of  By: Orvan Falconer MD, Felton Buczynski     GERD (gastroesophageal reflux disease)    HEPATITIS B, CHRONIC 07/28/2006   Qualifier: Diagnosis of  By: Orvan Falconer MD, Jabria Loos     HIV DISEASE 07/28/2006   Annotation: dx 2000 Qualifier: Diagnosis of  By: Orvan Falconer MD, Cerra Eisenhower     HIV infection [facility])    Hyperlipidemia    Hypertension    HYPERTENSION NEC 07/07/2009   Qualifier: Diagnosis of  By: Orvan Falconer MD, Tauheedah Bok     Kidney stones    NEPHROLITHIASIS 08/22/2006   Annotation: x2, last 1992  Qualifier: Diagnosis of  By: Orvan Falconer MD, Barry Faircloth     Obesity 09/12/2011   PSORIASIS 08/22/2006   Qualifier: Diagnosis of  By: Orvan Falconer MD, Kalvyn Desa     Past Surgical History:  Procedure Laterality Date   COLONOSCOPY     CYSTECTOMY  2000   cyst removed off buttock cheek    reports that he quit smoking about 10 years ago. His smoking use included cigarettes. He smoked an average of .1 packs per day. He has never used smokeless tobacco. He reports current alcohol use of about 3.0 - 5.0 standard drinks of alcohol per week. He reports that he does not use drugs. family history includes Arthritis in an other family member; Cancer in an other family member; Colon cancer in his sister; Diabetes in his maternal aunt; Heart disease in an other family member; Heart failure in his mother; Hypertension in an other family member. Allergies  Allergen Reactions   Lisinopril Rash   Current Outpatient Medications on File Prior to Visit  Medication Sig Dispense Refill   aspirin 81 MG tablet Take 81 mg by mouth daily.     atorvastatin (LIPITOR) 20 MG tablet      Darunavir-Cobicistat-Emtricitabine-Tenofovir Alafenamide (SYMTUZA) 800-150-200-10 MG  TABS Take 1 tablet by mouth daily with breakfast. 30 tablet 11   diclofenac (VOLTAREN) 50 MG EC tablet TAKE 1 TABLET BY MOUTH TWICE A DAY AS NEEDED 60 tablet 2   diclofenac Sodium (VOLTAREN) 1 % GEL APPLY 4 G TOPICALLY 4 (FOUR) TIMES DAILY AS NEEDED. 400 g 3   ezetimibe (ZETIA) 10 MG tablet Take 1 tablet (10 mg total) by mouth daily. 90 tablet 3   pantoprazole (PROTONIX) 40 MG tablet Take 1 tablet by mouth daily.     rosuvastatin (CRESTOR) 20 MG tablet Take 1 tablet (20 mg total) by mouth daily. 90 tablet 3   valACYclovir (VALTREX) 500 MG tablet Take 1 tablet (500 mg total) by mouth 2 (two) times daily. ICD 10 A60.00 180 tablet 1   augmented betamethasone dipropionate (DIPROLENE-AF) 0.05 % cream APPLY TO AFFECTED AREA UP TO TWICE A DAY AS NEEDED( NOT TO FACE,GROIN OR  UNDERASRMS ) (Patient not taking: Reported on 05/31/2022)     esomeprazole (NEXIUM) 40 MG capsule Take 1 capsule (40 mg total) by mouth daily. (Patient not taking: Reported on 05/31/2022) 90 capsule 3   podofilox (CONDYLOX) 0.5 % gel Apply topically 2 (two) times daily. (Patient not taking: Reported on 05/31/2022) 3.5 g 2   No current facility-administered medications on file prior to visit.        ROS:  All others reviewed and negative.  Objective        PE:  Temp 98.5 F (36.9 C) (Oral)   Ht 6' (1.829 m)   Wt 226 lb (102.5 kg)   SpO2 93%   BMI 30.65 kg/m   Lying 110/64, HR 73, standing 114/70 HR 83               Constitutional: Pt appears in NAD               HENT: Head: NCAT.                Right Ear: External ear normal.                 Left Ear: External ear normal. Bilat tm's with mild erythema.  Max sinus areas non tender.  Pharynx with mild erythema, no exudate               Eyes: . Pupils are equal, round, and reactive to light. Conjunctivae and EOM are normal               Nose: without d/c or deformity               Neck: Neck supple. Gross normal ROM               Cardiovascular: Normal rate and regular rhythm.                 Pulmonary/Chest: Effort normal and breath sounds without rales or wheezing.                Abd:  Soft, NT, ND, + BS, no organomegaly               Neurological: Pt is alert. At baseline orientation, motor grossly intact               Skin: Skin is warm. No rashes, no other new lesions, LE edema - none               Psychiatric: Pt behavior is normal without agitation   Micro: none  Cardiac  tracings I have personally interpreted today:  none  Pertinent Radiological findings (summarize): none   Lab Results  Component Value Date   WBC 8.1 03/10/2022   HGB 13.4 03/10/2022   HCT 39.4 03/10/2022   PLT 265 03/10/2022   GLUCOSE 131 (H) 08/31/2022   CHOL 141 08/31/2022   TRIG 44 08/31/2022   HDL 51 08/31/2022   LDLCALC 80 08/31/2022   ALT 46  (H) 08/31/2022   AST 41 (H) 03/10/2022   NA 141 08/31/2022   K 4.7 08/31/2022   CL 106 08/31/2022   CREATININE 0.91 08/31/2022   BUN 23 08/31/2022   CO2 23 08/31/2022   TSH 3.82 04/14/2022   PSA 0.20 04/14/2022   HGBA1C 5.3 04/14/2022   Assessment/Plan:  Daelynn Rodd is a 62 y.o. White or Caucasian [1] male with  has a past medical history of ANXIETY DISORDER, GENERALIZED (08/22/2006), Carpal tunnel syndrome, right (05/31/2012), CERVICAL RADICULOPATHY (08/22/2006), DEGENERATIVE DISC DISEASE (08/22/2006), GENITAL HERPES (08/22/2006), GERD (07/28/2006), GERD (gastroesophageal reflux disease), HEPATITIS B, CHRONIC (07/28/2006), HIV DISEASE (07/28/2006), HIV infection (HCC), Hyperlipidemia, Hypertension, HYPERTENSION NEC (07/07/2009), Kidney stones, NEPHROLITHIASIS (08/22/2006), Obesity (09/12/2011), and PSORIASIS (08/22/2006).  Allergic rhinitis New onset , for depomedrol IM 80 mg qd, prednisone taper, allegra and nasacort asd,  to f/u any worsening symptoms or concerns    Dizziness I suspect related to eustachian dysfxn, for mucinex bid prn, and tx as above  Vitamin D deficiency Last vitamin D Lab Results  Component Value Date   VD25OH 36.80 04/14/2022   Low, to start oral replacement   Hyperlipidemia Lab Results  Component Value Date   LDLCALC 80 08/31/2022   Stable recently and current symptoms not improved with stopping the zetia for 3 wks, pt to restart zetia 10 mg and lipitor 20 mg qd  Followup: Return if symptoms worsen or fail to improve.  Oliver Barre, MD 10/09/2022 3:32 PM [facility]

## 2022-10-09 ENCOUNTER — Encounter: Payer: Self-pay | Admitting: Internal Medicine

## 2022-10-09 DIAGNOSIS — J309 Allergic rhinitis, unspecified: Secondary | ICD-10-CM | POA: Insufficient documentation

## 2022-10-09 NOTE — Assessment & Plan Note (Signed)
Lab Results  Component Value Date   LDLCALC 80 08/31/2022   Stable recently and current symptoms not improved with stopping the zetia for 3 wks, pt to restart zetia 10 mg and lipitor 20 mg qd

## 2022-10-09 NOTE — Assessment & Plan Note (Signed)
I suspect related to eustachian dysfxn, for mucinex bid prn, and tx as above

## 2022-10-09 NOTE — Assessment & Plan Note (Signed)
New onset , for depomedrol IM 80 mg qd, prednisone taper, allegra and nasacort asd,  to f/u any worsening symptoms or concerns

## 2022-10-09 NOTE — Assessment & Plan Note (Signed)
Last vitamin D Lab Results  Component Value Date   VD25OH 36.80 04/14/2022   Low, to start oral replacement

## 2022-10-10 ENCOUNTER — Other Ambulatory Visit: Payer: Self-pay

## 2022-10-17 ENCOUNTER — Encounter: Payer: Self-pay | Admitting: Internal Medicine

## 2022-10-17 DIAGNOSIS — R000 Tachycardia, unspecified: Secondary | ICD-10-CM

## 2022-10-17 MED ORDER — DIAZEPAM 5 MG PO TABS
5.0000 mg | ORAL_TABLET | Freq: Every day | ORAL | 1 refills | Status: DC | PRN
  Filled 2022-10-17: qty 30, 30d supply, fill #0

## 2022-10-18 ENCOUNTER — Other Ambulatory Visit: Payer: Self-pay

## 2022-10-18 NOTE — Addendum Note (Signed)
Addended by: Biagio Borg on: 10/18/2022 08:27 AM   Modules accepted: Orders

## 2022-10-19 ENCOUNTER — Other Ambulatory Visit: Payer: Self-pay

## 2022-10-19 ENCOUNTER — Encounter (HOSPITAL_COMMUNITY): Payer: Self-pay

## 2022-10-19 ENCOUNTER — Other Ambulatory Visit (HOSPITAL_COMMUNITY): Payer: Self-pay

## 2022-10-19 ENCOUNTER — Other Ambulatory Visit (INDEPENDENT_AMBULATORY_CARE_PROVIDER_SITE_OTHER): Payer: 59

## 2022-10-19 DIAGNOSIS — R000 Tachycardia, unspecified: Secondary | ICD-10-CM | POA: Diagnosis not present

## 2022-10-19 LAB — T4, FREE: Free T4: 1.08 ng/dL (ref 0.60–1.60)

## 2022-10-19 LAB — CORTISOL: Cortisol, Plasma: 0.7 ug/dL

## 2022-10-19 LAB — TSH: TSH: 3.14 u[IU]/mL (ref 0.35–5.50)

## 2022-10-20 ENCOUNTER — Other Ambulatory Visit (HOSPITAL_COMMUNITY): Payer: Self-pay

## 2022-10-21 ENCOUNTER — Other Ambulatory Visit: Payer: Self-pay

## 2022-11-03 ENCOUNTER — Other Ambulatory Visit (HOSPITAL_COMMUNITY): Payer: Self-pay

## 2022-11-07 ENCOUNTER — Other Ambulatory Visit (HOSPITAL_COMMUNITY): Payer: Self-pay

## 2022-11-08 ENCOUNTER — Other Ambulatory Visit: Payer: Self-pay

## 2022-11-24 ENCOUNTER — Other Ambulatory Visit: Payer: Self-pay | Admitting: Internal Medicine

## 2022-11-29 ENCOUNTER — Encounter: Payer: Self-pay | Admitting: Internal Medicine

## 2022-11-29 ENCOUNTER — Other Ambulatory Visit (HOSPITAL_COMMUNITY): Payer: Self-pay

## 2022-11-29 MED ORDER — PODOFILOX 0.5 % EX GEL
Freq: Two times a day (BID) | CUTANEOUS | 2 refills | Status: DC
  Filled 2022-11-29: qty 3.5, 7d supply, fill #0

## 2022-11-29 NOTE — Telephone Encounter (Signed)
Last filled on 05/2022. Is this ok.Marland KitchenRaechel Steele

## 2022-11-30 ENCOUNTER — Other Ambulatory Visit: Payer: Self-pay

## 2022-11-30 ENCOUNTER — Other Ambulatory Visit (HOSPITAL_COMMUNITY): Payer: Self-pay

## 2022-12-01 ENCOUNTER — Other Ambulatory Visit (HOSPITAL_COMMUNITY): Payer: Self-pay

## 2022-12-05 ENCOUNTER — Ambulatory Visit: Payer: 59

## 2022-12-08 ENCOUNTER — Other Ambulatory Visit (HOSPITAL_COMMUNITY): Payer: Self-pay

## 2022-12-29 ENCOUNTER — Other Ambulatory Visit: Payer: Self-pay | Admitting: Internal Medicine

## 2022-12-29 ENCOUNTER — Other Ambulatory Visit: Payer: Self-pay

## 2023-01-03 ENCOUNTER — Other Ambulatory Visit (HOSPITAL_COMMUNITY): Payer: Self-pay

## 2023-01-05 ENCOUNTER — Other Ambulatory Visit (HOSPITAL_COMMUNITY): Payer: Self-pay

## 2023-01-06 ENCOUNTER — Other Ambulatory Visit (HOSPITAL_COMMUNITY): Payer: Self-pay

## 2023-01-31 ENCOUNTER — Other Ambulatory Visit (HOSPITAL_COMMUNITY): Payer: Self-pay

## 2023-02-03 ENCOUNTER — Other Ambulatory Visit (HOSPITAL_COMMUNITY): Payer: Self-pay

## 2023-02-03 ENCOUNTER — Other Ambulatory Visit: Payer: Self-pay

## 2023-02-23 ENCOUNTER — Other Ambulatory Visit: Payer: Self-pay | Admitting: Internal Medicine

## 2023-02-23 DIAGNOSIS — A6000 Herpesviral infection of urogenital system, unspecified: Secondary | ICD-10-CM

## 2023-03-01 ENCOUNTER — Other Ambulatory Visit: Payer: Self-pay

## 2023-03-03 ENCOUNTER — Other Ambulatory Visit: Payer: Self-pay

## 2023-03-03 ENCOUNTER — Other Ambulatory Visit (HOSPITAL_COMMUNITY): Payer: Self-pay

## 2023-03-08 ENCOUNTER — Other Ambulatory Visit: Payer: Self-pay

## 2023-03-08 ENCOUNTER — Other Ambulatory Visit: Payer: 59

## 2023-03-08 DIAGNOSIS — Z113 Encounter for screening for infections with a predominantly sexual mode of transmission: Secondary | ICD-10-CM

## 2023-03-08 DIAGNOSIS — B20 Human immunodeficiency virus [HIV] disease: Secondary | ICD-10-CM

## 2023-03-09 ENCOUNTER — Encounter (INDEPENDENT_AMBULATORY_CARE_PROVIDER_SITE_OTHER): Payer: Self-pay

## 2023-03-09 ENCOUNTER — Other Ambulatory Visit (HOSPITAL_COMMUNITY): Payer: Self-pay

## 2023-03-15 ENCOUNTER — Other Ambulatory Visit: Payer: 59

## 2023-03-15 ENCOUNTER — Other Ambulatory Visit: Payer: Self-pay

## 2023-03-15 ENCOUNTER — Other Ambulatory Visit (HOSPITAL_COMMUNITY)
Admission: RE | Admit: 2023-03-15 | Discharge: 2023-03-15 | Disposition: A | Discharge status: Home / Self Care | Payer: 59 | Source: Ambulatory Visit | Attending: Internal Medicine | Admitting: Internal Medicine

## 2023-03-15 DIAGNOSIS — Z113 Encounter for screening for infections with a predominantly sexual mode of transmission: Secondary | ICD-10-CM

## 2023-03-15 DIAGNOSIS — B20 Human immunodeficiency virus [HIV] disease: Secondary | ICD-10-CM

## 2023-03-16 LAB — URINE CYTOLOGY ANCILLARY ONLY
Chlamydia: NEGATIVE
Comment: NEGATIVE
Comment: NORMAL
Neisseria Gonorrhea: NEGATIVE

## 2023-03-17 LAB — COMPLETE METABOLIC PANEL WITH GFR
AG Ratio: 2 (calc) (ref 1.0–2.5)
ALT: 80 U/L — ABNORMAL HIGH (ref 9–46)
AST: 70 U/L — ABNORMAL HIGH (ref 10–35)
Albumin: 4.8 g/dL (ref 3.6–5.1)
Alkaline phosphatase (APISO): 63 U/L (ref 35–144)
BUN/Creatinine Ratio: 27 (calc) — ABNORMAL HIGH (ref 6–22)
BUN: 28 mg/dL — ABNORMAL HIGH (ref 7–25)
CO2: 29 mmol/L (ref 20–32)
Calcium: 10.1 mg/dL (ref 8.6–10.3)
Chloride: 101 mmol/L (ref 98–110)
Creat: 1.03 mg/dL (ref 0.70–1.35)
Globulin: 2.4 g/dL (ref 1.9–3.7)
Glucose, Bld: 88 mg/dL (ref 65–99)
Potassium: 4.5 mmol/L (ref 3.5–5.3)
Sodium: 139 mmol/L (ref 135–146)
Total Bilirubin: 0.5 mg/dL (ref 0.2–1.2)
Total Protein: 7.2 g/dL (ref 6.1–8.1)
eGFR: 83 mL/min/{1.73_m2} (ref >=60)

## 2023-03-17 LAB — CBC WITH DIFFERENTIAL/PLATELET
Absolute Monocytes: 428 {cells}/uL (ref 200–950)
Basophils Absolute: 30 {cells}/uL (ref 0–200)
Basophils Relative: 0.4 %
Eosinophils Absolute: 23 {cells}/uL (ref 15–500)
Eosinophils Relative: 0.3 %
HCT: 42.5 % (ref 38.5–50.0)
Hemoglobin: 14.4 g/dL (ref 13.2–17.1)
Lymphs Abs: 2160 {cells}/uL (ref 850–3900)
MCH: 33.8 pg — ABNORMAL HIGH (ref 27.0–33.0)
MCHC: 33.9 g/dL (ref 32.0–36.0)
MCV: 99.8 fL (ref 80.0–100.0)
MPV: 10.7 fL (ref 7.5–12.5)
Monocytes Relative: 5.7 %
Neutro Abs: 4860 {cells}/uL (ref 1500–7800)
Neutrophils Relative %: 64.8 %
Platelets: 200 10*3/uL (ref 140–400)
RBC: 4.26 10*6/uL (ref 4.20–5.80)
RDW: 11.8 % (ref 11.0–15.0)
Total Lymphocyte: 28.8 %
WBC: 7.5 10*3/uL (ref 3.8–10.8)

## 2023-03-17 LAB — T-HELPER CELL (CD4) - (RCID CLINIC ONLY)
CD4 % Helper T Cell: 29 % — ABNORMAL LOW (ref 33–65)
CD4 T Cell Abs: 561 /uL (ref 400–1790)

## 2023-03-17 LAB — HIV-1 RNA QUANT-NO REFLEX-BLD
HIV 1 RNA Quant: NOT DETECTED {copies}/mL
HIV-1 RNA Quant, Log: NOT DETECTED {Log_copies}/mL

## 2023-03-17 LAB — RPR: RPR Ser Ql: NONREACTIVE

## 2023-03-22 ENCOUNTER — Other Ambulatory Visit: Payer: Self-pay

## 2023-03-22 ENCOUNTER — Ambulatory Visit: Payer: 59 | Admitting: Internal Medicine

## 2023-03-22 ENCOUNTER — Other Ambulatory Visit (HOSPITAL_COMMUNITY): Payer: Self-pay

## 2023-03-22 ENCOUNTER — Encounter: Payer: Self-pay | Admitting: Internal Medicine

## 2023-03-22 VITALS — BP 130/85 | HR 64 | Temp 98.4°F | Ht 71.0 in | Wt 220.0 lb

## 2023-03-22 DIAGNOSIS — B181 Chronic viral hepatitis B without delta-agent: Secondary | ICD-10-CM

## 2023-03-22 DIAGNOSIS — B20 Human immunodeficiency virus [HIV] disease: Secondary | ICD-10-CM

## 2023-03-22 DIAGNOSIS — Z23 Encounter for immunization: Secondary | ICD-10-CM

## 2023-03-22 DIAGNOSIS — Z113 Encounter for screening for infections with a predominantly sexual mode of transmission: Secondary | ICD-10-CM | POA: Insufficient documentation

## 2023-03-22 DIAGNOSIS — R7401 Elevation of levels of liver transaminase levels: Secondary | ICD-10-CM

## 2023-03-22 MED ORDER — SYMTUZA 800-150-200-10 MG PO TABS
1.0000 | ORAL_TABLET | Freq: Every day | ORAL | 11 refills | Status: DC
Start: 2023-03-22 — End: 2024-03-21
  Filled 2023-03-22 – 2023-04-04 (×3): qty 30, 30d supply, fill #0
  Filled 2023-04-27: qty 30, 30d supply, fill #1
  Filled 2023-05-30 (×4): qty 30, 30d supply, fill #2
  Filled 2023-06-28: qty 30, 30d supply, fill #3
  Filled 2023-07-27 (×2): qty 30, 30d supply, fill #4
  Filled 2023-08-21: qty 30, 30d supply, fill #5
  Filled 2023-09-21: qty 30, 30d supply, fill #6
  Filled 2023-10-17: qty 30, 30d supply, fill #7
  Filled 2023-11-16: qty 30, 30d supply, fill #8
  Filled 2023-12-19: qty 30, 30d supply, fill #9
  Filled 2024-01-22: qty 30, 30d supply, fill #10
  Filled 2024-02-12 – 2024-02-21 (×2): qty 30, 30d supply, fill #11

## 2023-03-22 NOTE — Assessment & Plan Note (Signed)
As remained suppressed with last lab earlier this year.   Will continue to monitor. On Symtuza

## 2023-03-22 NOTE — Assessment & Plan Note (Signed)
Mild elevation, up from his baseline.  Unclear cause but certainly the added supplement could be the reason, which he has stopped.  Hepatitits B possible but has been suppressed.  ? Fatty liver Will continue to monitor.

## 2023-03-22 NOTE — Assessment & Plan Note (Signed)
Screened negative 

## 2023-03-22 NOTE — Assessment & Plan Note (Signed)
He continues to do well, no concerns.  Meds refilled for the year.   Labs reviewed with him Follow up in 1 year.

## 2023-03-22 NOTE — Progress Notes (Signed)
   Subjective:    Patient ID: Anthony Steele, male    DOB: Feb 01, 1961, 62 y.o.   MRN: [id number]  HPI Anthony Steele is here for follow up of HIV He continues on Symtuza with no missed doses.   Continues with excellent compliance and no concerns.  Some elevation in his LFTs.  No concerns otherwise.  Was taking a supplement recently and with the noted rise in LFTs, stopped.     Review of Systems  Constitutional:  Negative for fatigue.  Gastrointestinal:  Negative for diarrhea.  Skin:  Negative for rash.       Objective:   Physical Exam Eyes:     General: No scleral icterus. Pulmonary:     Effort: Pulmonary effort is normal.  Skin:    Findings: No rash.  Neurological:     Mental Status: He is alert.   SH: no tobacco        Assessment & Plan:

## 2023-03-28 ENCOUNTER — Other Ambulatory Visit (HOSPITAL_COMMUNITY): Payer: Self-pay

## 2023-03-30 ENCOUNTER — Other Ambulatory Visit (HOSPITAL_COMMUNITY): Payer: Self-pay

## 2023-03-30 ENCOUNTER — Other Ambulatory Visit: Payer: Self-pay

## 2023-04-02 ENCOUNTER — Other Ambulatory Visit: Payer: Self-pay | Admitting: Internal Medicine

## 2023-04-03 ENCOUNTER — Other Ambulatory Visit: Payer: Self-pay

## 2023-04-04 ENCOUNTER — Other Ambulatory Visit (HOSPITAL_COMMUNITY): Payer: Self-pay

## 2023-04-18 ENCOUNTER — Ambulatory Visit (INDEPENDENT_AMBULATORY_CARE_PROVIDER_SITE_OTHER): Payer: 59 | Admitting: Internal Medicine

## 2023-04-18 ENCOUNTER — Encounter: Payer: Self-pay | Admitting: Internal Medicine

## 2023-04-18 VITALS — BP 138/72 | HR 67 | Temp 98.4°F | Ht 71.0 in | Wt 201.0 lb

## 2023-04-18 DIAGNOSIS — E538 Deficiency of other specified B group vitamins: Secondary | ICD-10-CM

## 2023-04-18 DIAGNOSIS — Z0000 Encounter for general adult medical examination without abnormal findings: Secondary | ICD-10-CM | POA: Diagnosis not present

## 2023-04-18 DIAGNOSIS — I10 Essential (primary) hypertension: Secondary | ICD-10-CM | POA: Diagnosis not present

## 2023-04-18 DIAGNOSIS — E782 Mixed hyperlipidemia: Secondary | ICD-10-CM

## 2023-04-18 DIAGNOSIS — Z125 Encounter for screening for malignant neoplasm of prostate: Secondary | ICD-10-CM

## 2023-04-18 DIAGNOSIS — Z1211 Encounter for screening for malignant neoplasm of colon: Secondary | ICD-10-CM | POA: Diagnosis not present

## 2023-04-18 DIAGNOSIS — E559 Vitamin D deficiency, unspecified: Secondary | ICD-10-CM

## 2023-04-18 DIAGNOSIS — R739 Hyperglycemia, unspecified: Secondary | ICD-10-CM

## 2023-04-18 DIAGNOSIS — Z0001 Encounter for general adult medical examination with abnormal findings: Secondary | ICD-10-CM

## 2023-04-18 LAB — HEPATIC FUNCTION PANEL
ALT: 57 U/L — ABNORMAL HIGH (ref 0–53)
AST: 51 U/L — ABNORMAL HIGH (ref 0–37)
Albumin: 4.8 g/dL (ref 3.5–5.2)
Alkaline Phosphatase: 61 U/L (ref 39–117)
Bilirubin, Direct: 0.2 mg/dL (ref 0.0–0.3)
Total Bilirubin: 0.7 mg/dL (ref 0.2–1.2)
Total Protein: 7.4 g/dL (ref 6.0–8.3)

## 2023-04-18 LAB — CBC WITH DIFFERENTIAL/PLATELET
Basophils Absolute: 0 10*3/uL (ref 0.0–0.1)
Basophils Relative: 0.4 % (ref 0.0–3.0)
Eosinophils Absolute: 0 10*3/uL (ref 0.0–0.7)
Eosinophils Relative: 0.1 % (ref 0.0–5.0)
HCT: 44.8 % (ref 39.0–52.0)
Hemoglobin: 14.6 g/dL (ref 13.0–17.0)
Lymphocytes Relative: 21.3 % (ref 12.0–46.0)
Lymphs Abs: 1.7 10*3/uL (ref 0.7–4.0)
MCHC: 32.5 g/dL (ref 30.0–36.0)
MCV: 102.1 fl — ABNORMAL HIGH (ref 78.0–100.0)
Monocytes Absolute: 0.5 10*3/uL (ref 0.1–1.0)
Monocytes Relative: 6.3 % (ref 3.0–12.0)
Neutro Abs: 5.9 10*3/uL (ref 1.4–7.7)
Neutrophils Relative %: 71.9 % (ref 43.0–77.0)
Platelets: 179 10*3/uL (ref 150.0–400.0)
RBC: 4.39 Mil/uL (ref 4.22–5.81)
RDW: 13.1 % (ref 11.5–15.5)
WBC: 8.2 10*3/uL (ref 4.0–10.5)

## 2023-04-18 LAB — MICROALBUMIN / CREATININE URINE RATIO
Creatinine,U: 29.1 mg/dL
Microalb Creat Ratio: 2.4 mg/g (ref 0.0–30.0)
Microalb, Ur: <0.7 mg/dL (ref 0.0–1.9)

## 2023-04-18 LAB — URINALYSIS, ROUTINE W REFLEX MICROSCOPIC
Bilirubin Urine: NEGATIVE
Hgb urine dipstick: NEGATIVE
Ketones, ur: NEGATIVE
Leukocytes,Ua: NEGATIVE
Nitrite: NEGATIVE
RBC / HPF: NONE SEEN (ref 0–?)
Specific Gravity, Urine: 1.01 (ref 1.000–1.030)
Total Protein, Urine: NEGATIVE
Urine Glucose: NEGATIVE
Urobilinogen, UA: 0.2 (ref 0.0–1.0)
WBC, UA: NONE SEEN (ref 0–?)
pH: 6 (ref 5.0–8.0)

## 2023-04-18 LAB — LIPID PANEL
Cholesterol: 158 mg/dL (ref 0–200)
HDL: 72.3 mg/dL (ref >39.00)
LDL Cholesterol: 70 mg/dL (ref 0–99)
NonHDL: 85.78
Total CHOL/HDL Ratio: 2
Triglycerides: 80 mg/dL (ref 0.0–149.0)
VLDL: 16 mg/dL (ref 0.0–40.0)

## 2023-04-18 LAB — TSH: TSH: 4.12 u[IU]/mL (ref 0.35–5.50)

## 2023-04-18 LAB — BASIC METABOLIC PANEL
BUN: 30 mg/dL — ABNORMAL HIGH (ref 6–23)
CO2: 29 mEq/L (ref 19–32)
Calcium: 10.4 mg/dL (ref 8.4–10.5)
Chloride: 101 mEq/L (ref 96–112)
Creatinine, Ser: 1 mg/dL (ref 0.40–1.50)
GFR: 81.54 mL/min (ref >60.00)
Glucose, Bld: 103 mg/dL — ABNORMAL HIGH (ref 70–99)
Potassium: 5.5 mEq/L — ABNORMAL HIGH (ref 3.5–5.1)
Sodium: 139 mEq/L (ref 135–145)

## 2023-04-18 LAB — PSA: PSA: 0.28 ng/mL (ref 0.10–4.00)

## 2023-04-18 LAB — VITAMIN B12: Vitamin B-12: 513 pg/mL (ref 211–911)

## 2023-04-18 LAB — VITAMIN D 25 HYDROXY (VIT D DEFICIENCY, FRACTURES): VITD: 31.64 ng/mL (ref 30.00–100.00)

## 2023-04-18 LAB — HEMOGLOBIN A1C: Hgb A1c MFr Bld: 5.3 % (ref 4.6–6.5)

## 2023-04-18 NOTE — Progress Notes (Signed)
The test results show that your current treatment is OK, as the tests are stable.  Please continue the same plan.  There is no other need for change of treatment or further evaluation based on these results, at this time.  thanks 

## 2023-04-18 NOTE — Patient Instructions (Signed)
Please continue all other medications as before, and refills have been done if requested.  Please have the pharmacy call with any other refills you may need.  Please continue your efforts at being more active, low cholesterol diet, and weight control.  You are otherwise up to date with prevention measures today.  Please keep your appointments with your specialists as you may have planned  You will be contacted regarding the referral for: colonoscopy  Please go to the LAB at the blood drawing area for the tests to be done  You will be contacted by phone if any changes need to be made immediately.  Otherwise, you will receive a letter about your results with an explanation, but please check with MyChart first.  Please make an Appointment to return for your 1 year visit, or sooner if needed

## 2023-04-19 ENCOUNTER — Encounter: Payer: Self-pay | Admitting: Internal Medicine

## 2023-04-22 NOTE — Assessment & Plan Note (Signed)
Lab Results  Component Value Date   LDLCALC 70 04/18/2023   Uncontrolled, goal ldl < 70, pt to continue current statin zetia 10 every day, crestor 20 qd

## 2023-04-22 NOTE — Assessment & Plan Note (Signed)
Age and sex appropriate education and counseling updated with regular exercise and diet Referrals for preventative services - for colonoscopy soon Immunizations addressed - declines covid booster Smoking counseling  - none needed Evidence for depression or other mood disorder - none significant Most recent labs reviewed. I have personally reviewed and have noted: 1) the patient's medical and social history 2) The patient's current medications and supplements 3) The patient's height, weight, and BMI have been recorded in the chart

## 2023-04-22 NOTE — Assessment & Plan Note (Signed)
Lab Results  Component Value Date   HGBA1C 5.3 04/18/2023   Stable, pt to continue current medical treatment   - diet, wt control

## 2023-04-22 NOTE — Assessment & Plan Note (Signed)
Last vitamin D Lab Results  Component Value Date   VD25OH 31.64 04/18/2023   Low, to start oral replacement

## 2023-04-22 NOTE — Assessment & Plan Note (Signed)
BP Readings from Last 3 Encounters:  04/18/23 138/72  03/22/23 130/85  05/31/22 126/86   Stable, pt to continue medical treatment  - diet, wt control

## 2023-04-26 ENCOUNTER — Other Ambulatory Visit (HOSPITAL_COMMUNITY): Payer: Self-pay

## 2023-04-27 ENCOUNTER — Other Ambulatory Visit: Payer: Self-pay

## 2023-04-27 NOTE — Progress Notes (Signed)
Specialty Pharmacy Refill Coordination Note  Anthony Steele is a 62 y.o. male contacted today regarding refills of specialty medication(s) Darun-Cobic-Emtricit-Tenofaf   Patient requested Anthony Steele at [facility] at [place] date: 05/06/23   Medication will be filled on 05/04/23.

## 2023-05-05 ENCOUNTER — Other Ambulatory Visit (HOSPITAL_COMMUNITY): Payer: Self-pay

## 2023-05-24 ENCOUNTER — Other Ambulatory Visit: Payer: Self-pay

## 2023-05-25 ENCOUNTER — Other Ambulatory Visit: Payer: Self-pay | Admitting: Cardiovascular Disease

## 2023-05-25 DIAGNOSIS — Z8249 Family history of ischemic heart disease and other diseases of the circulatory system: Secondary | ICD-10-CM

## 2023-05-25 DIAGNOSIS — I2510 Atherosclerotic heart disease of native coronary artery without angina pectoris: Secondary | ICD-10-CM

## 2023-05-25 DIAGNOSIS — E782 Mixed hyperlipidemia: Secondary | ICD-10-CM

## 2023-05-25 DIAGNOSIS — Z79899 Other long term (current) drug therapy: Secondary | ICD-10-CM

## 2023-05-26 ENCOUNTER — Other Ambulatory Visit: Payer: Self-pay

## 2023-05-29 ENCOUNTER — Other Ambulatory Visit (HOSPITAL_COMMUNITY): Payer: Self-pay

## 2023-05-30 ENCOUNTER — Other Ambulatory Visit: Payer: Self-pay

## 2023-05-30 NOTE — Progress Notes (Signed)
Specialty Pharmacy Refill Coordination Note  Anthony Steele is a 62 y.o. male contacted today regarding refills of specialty medication(s) Darun-Cobic-Emtricit-Tenofaf   Patient requested [name] at [facility] at [place] date: 05/31/23   Medication will be filled on 05/30/23.

## 2023-06-05 ENCOUNTER — Other Ambulatory Visit (HOSPITAL_COMMUNITY): Payer: Self-pay

## 2023-06-16 ENCOUNTER — Other Ambulatory Visit: Payer: Self-pay | Admitting: Cardiovascular Disease

## 2023-06-16 DIAGNOSIS — E782 Mixed hyperlipidemia: Secondary | ICD-10-CM

## 2023-06-16 DIAGNOSIS — I2510 Atherosclerotic heart disease of native coronary artery without angina pectoris: Secondary | ICD-10-CM

## 2023-06-16 DIAGNOSIS — Z79899 Other long term (current) drug therapy: Secondary | ICD-10-CM

## 2023-06-16 DIAGNOSIS — Z8249 Family history of ischemic heart disease and other diseases of the circulatory system: Secondary | ICD-10-CM

## 2023-06-20 ENCOUNTER — Other Ambulatory Visit: Payer: Self-pay | Admitting: Internal Medicine

## 2023-06-20 ENCOUNTER — Other Ambulatory Visit: Payer: Self-pay

## 2023-06-21 ENCOUNTER — Other Ambulatory Visit (HOSPITAL_COMMUNITY): Payer: Self-pay

## 2023-06-26 ENCOUNTER — Encounter (HOSPITAL_COMMUNITY): Payer: Self-pay

## 2023-06-26 ENCOUNTER — Other Ambulatory Visit (HOSPITAL_COMMUNITY): Payer: Self-pay

## 2023-06-28 ENCOUNTER — Other Ambulatory Visit: Payer: Self-pay | Admitting: Internal Medicine

## 2023-06-28 ENCOUNTER — Other Ambulatory Visit: Payer: Self-pay

## 2023-06-28 ENCOUNTER — Other Ambulatory Visit (HOSPITAL_COMMUNITY): Payer: Self-pay | Admitting: Pharmacy Technician

## 2023-06-28 ENCOUNTER — Other Ambulatory Visit (HOSPITAL_COMMUNITY): Payer: Self-pay

## 2023-06-28 NOTE — Progress Notes (Signed)
Specialty Pharmacy Refill Coordination Note  Anthony Steele is a 62 y.o. male contacted today regarding refills of specialty medication(s) Darun-Cobic-Emtricit-Tenofaf   Patient requested Anthony Steele at [facility] at [place] date: 07/05/23   Medication will be filled on 07/04/23.

## 2023-07-04 ENCOUNTER — Other Ambulatory Visit: Payer: Self-pay

## 2023-07-07 ENCOUNTER — Other Ambulatory Visit (HOSPITAL_COMMUNITY): Payer: Self-pay

## 2023-07-10 ENCOUNTER — Ambulatory Visit: Payer: 59

## 2023-07-10 VITALS — Ht 71.0 in | Wt 195.0 lb

## 2023-07-10 DIAGNOSIS — Z1211 Encounter for screening for malignant neoplasm of colon: Secondary | ICD-10-CM

## 2023-07-10 DIAGNOSIS — Z800 Family history of malignant neoplasm of digestive organs: Secondary | ICD-10-CM

## 2023-07-10 MED ORDER — NA SULFATE-K SULFATE-MG SULF 17.5-3.13-1.6 GM/177ML PO SOLN: 1.0000 | Freq: Once | ORAL | 0 refills | Status: AC

## 2023-07-10 NOTE — Progress Notes (Signed)

## 2023-07-14 ENCOUNTER — Other Ambulatory Visit: Payer: Self-pay | Admitting: Cardiovascular Disease

## 2023-07-14 DIAGNOSIS — Z79899 Other long term (current) drug therapy: Secondary | ICD-10-CM

## 2023-07-14 DIAGNOSIS — Z8249 Family history of ischemic heart disease and other diseases of the circulatory system: Secondary | ICD-10-CM

## 2023-07-14 DIAGNOSIS — I2510 Atherosclerotic heart disease of native coronary artery without angina pectoris: Secondary | ICD-10-CM

## 2023-07-14 DIAGNOSIS — E782 Mixed hyperlipidemia: Secondary | ICD-10-CM

## 2023-07-24 ENCOUNTER — Other Ambulatory Visit (HOSPITAL_COMMUNITY): Payer: Self-pay

## 2023-07-27 ENCOUNTER — Encounter (HOSPITAL_COMMUNITY): Payer: Self-pay

## 2023-07-27 ENCOUNTER — Other Ambulatory Visit (HOSPITAL_COMMUNITY): Payer: Self-pay | Admitting: Pharmacy Technician

## 2023-07-27 ENCOUNTER — Other Ambulatory Visit (HOSPITAL_COMMUNITY): Payer: Self-pay

## 2023-07-27 NOTE — Progress Notes (Signed)
 Specialty Pharmacy Refill Coordination Note  Anthony Steele is a 63 y.o. male contacted today regarding refills of specialty medication(s) Symtuza   Patient requested (Patient-Rptd) Pickup at [facility] at [facility] date: (Patient-Rptd) 08/02/23   Medication will be filled on 08/01/23.

## 2023-07-28 ENCOUNTER — Encounter: Payer: Self-pay | Admitting: Internal Medicine

## 2023-07-31 ENCOUNTER — Encounter: Payer: Self-pay | Admitting: Internal Medicine

## 2023-07-31 ENCOUNTER — Ambulatory Visit (AMBULATORY_SURGERY_CENTER): Payer: 59 | Admitting: Internal Medicine

## 2023-07-31 VITALS — BP 102/61 | HR 59 | Temp 98.3°F | Resp 15 | Ht 71.0 in | Wt 195.0 lb

## 2023-07-31 DIAGNOSIS — F411 Generalized anxiety disorder: Secondary | ICD-10-CM | POA: Diagnosis not present

## 2023-07-31 DIAGNOSIS — D122 Benign neoplasm of ascending colon: Secondary | ICD-10-CM | POA: Diagnosis not present

## 2023-07-31 DIAGNOSIS — Z860100 Personal history of colon polyps, unspecified: Secondary | ICD-10-CM | POA: Diagnosis not present

## 2023-07-31 DIAGNOSIS — Z1211 Encounter for screening for malignant neoplasm of colon: Secondary | ICD-10-CM | POA: Diagnosis not present

## 2023-07-31 DIAGNOSIS — D123 Benign neoplasm of transverse colon: Secondary | ICD-10-CM

## 2023-07-31 DIAGNOSIS — I10 Essential (primary) hypertension: Secondary | ICD-10-CM | POA: Diagnosis not present

## 2023-07-31 DIAGNOSIS — E785 Hyperlipidemia, unspecified: Secondary | ICD-10-CM | POA: Diagnosis not present

## 2023-07-31 DIAGNOSIS — K5730 Diverticulosis of large intestine without perforation or abscess without bleeding: Secondary | ICD-10-CM

## 2023-07-31 DIAGNOSIS — K648 Other hemorrhoids: Secondary | ICD-10-CM | POA: Diagnosis not present

## 2023-07-31 DIAGNOSIS — D120 Benign neoplasm of cecum: Secondary | ICD-10-CM | POA: Diagnosis not present

## 2023-07-31 DIAGNOSIS — E669 Obesity, unspecified: Secondary | ICD-10-CM | POA: Diagnosis not present

## 2023-07-31 MED ORDER — SODIUM CHLORIDE 0.9 % IV SOLN: 500.0000 mL | Freq: Once | INTRAVENOUS | Status: DC

## 2023-07-31 NOTE — Op Note (Signed)
 [facility] Patient Name: Anthony Steele Procedure Date: 07/31/2023 8:35 AM MRN: [id number] Endoscopist: Gordy CHRISTELLA Starch , MD, [id number] Age: 63 Referring MD:  Date of Birth: 1961-04-14 Gender: Male Account #: [id number] Procedure:                Colonoscopy Indications:              High risk colon cancer surveillance: Personal                            history of non-advanced adenomas, Family history of                            colon cancer in a first-degree relative (sister),                            Last colonoscopy: September 2019 (TA x1) Medicines:                Monitored Anesthesia Care Procedure:                Pre-Anesthesia Assessment:                           - Prior to the procedure, a History and Physical                            was performed, and patient medications and                            allergies were reviewed. The patient's tolerance of                            previous anesthesia was also reviewed. The risks                            and benefits of the procedure and the sedation                            options and risks were discussed with the patient.                            All questions were answered, and informed consent                            was obtained. Prior Anticoagulants: The patient has                            taken no anticoagulant or antiplatelet agents. ASA                            Grade Assessment: III - A patient with severe                            systemic disease. After reviewing the risks and  benefits, the patient was deemed in satisfactory                            condition to undergo the procedure.                           After obtaining informed consent, the colonoscope                            was passed under direct vision. Throughout the                            procedure, the patient's blood pressure, pulse, and                            oxygen saturations  were monitored continuously. The                            Olympus CF-HQ190L ([id number]) Colonoscope was                            introduced through the anus and advanced to the                            cecum, identified by appendiceal orifice and                            ileocecal valve. The colonoscopy was performed                            without difficulty. The patient tolerated the                            procedure well. The quality of the bowel                            preparation was good after irrigation and lavage.                            The ileocecal valve, appendiceal orifice, and                            rectum were photographed. Scope In: 8:49:03 AM Scope Out: 9:11:53 AM Scope Withdrawal Time: 0 hours 16 minutes 6 seconds  Total Procedure Duration: 0 hours 22 minutes 50 seconds  Findings:                 The digital rectal exam was normal.                           A 5 mm polyp was found in the cecum. The polyp was                            sessile. The polyp was removed with a cold snare.  Resection and retrieval were complete.                           Two sessile polyps were found in the ascending                            colon. The polyps were 4 to 5 mm in size. These                            polyps were removed with a cold snare. Resection                            and retrieval were complete.                           Five sessile polyps were found in the transverse                            colon. The polyps were 3 to 6 mm in size. These                            polyps were removed with a cold snare. Resection                            and retrieval were complete.                           Multiple medium-mouthed and small-mouthed                            diverticula were found in the sigmoid colon.                           Internal hemorrhoids were found during                            retroflexion. The  hemorrhoids were small. Complications:            No immediate complications. Estimated Blood Loss:     Estimated blood loss was minimal. Impression:               - One 5 mm polyp in the cecum, removed with a cold                            snare. Resected and retrieved.                           - Two 4 to 5 mm polyps in the ascending colon,                            removed with a cold snare. Resected and retrieved.                           - Five 3 to 6 mm polyps in  the transverse colon,                            removed with a cold snare. Resected and retrieved.                           - Moderate diverticulosis in the sigmoid colon.                           - Internal hemorrhoids. Recommendation:           - Patient has a contact number available for                            emergencies. The signs and symptoms of potential                            delayed complications were discussed with the                            patient. Return to normal activities tomorrow.                            Written discharge instructions were provided to the                            patient.                           - Resume previous diet.                           - Continue present medications.                           - Await pathology results.                           - Repeat colonoscopy is recommended for                            surveillance. The colonoscopy date will be                            determined after pathology results from today's                            exam become available for review. 2 day prep                            recommended for next exam. Gordy CHRISTELLA Starch, MD 07/31/2023 9:15:17 AM This report has been signed electronically.

## 2023-07-31 NOTE — Progress Notes (Signed)
 Pt's states no medical or surgical changes since previsit or office visit.

## 2023-07-31 NOTE — Progress Notes (Signed)
 Report to PACU, RN, vss, BBS= Clear.

## 2023-07-31 NOTE — Patient Instructions (Signed)
 Thank you for letting us  care for your healthcare needs today! Please see handouts regarding Polyps, Hemorrhoids, and Diverticulosis.  YOU HAD AN ENDOSCOPIC PROCEDURE TODAY AT THE [facility]:   Refer to the procedure report that was given to you for any specific questions about what was found during the examination.  If the procedure report does not answer your questions, please call your gastroenterologist to clarify.  If you requested that your care partner not be given the details of your procedure findings, then the procedure report has been included in a sealed envelope for you to review at your convenience later.  YOU SHOULD EXPECT: Some feelings of bloating in the abdomen. Passage of more gas than usual.  Walking can help get rid of the air that was put into your GI tract during the procedure and reduce the bloating. If you had a lower endoscopy (such as a colonoscopy or flexible sigmoidoscopy) you may notice spotting of blood in your stool or on the toilet paper. If you underwent a bowel prep for your procedure, you may not have a normal bowel movement for a few days.  Please Note:  You might notice some irritation and congestion in your nose or some drainage.  This is from the oxygen used during your procedure.  There is no need for concern and it should clear up in a day or so.  SYMPTOMS TO REPORT IMMEDIATELY:  Following lower endoscopy (colonoscopy or flexible sigmoidoscopy):  Excessive amounts of blood in the stool  Significant tenderness or worsening of abdominal pains  Swelling of the abdomen that is new, acute  Fever of 100F or higher  For urgent or emergent issues, a gastroenterologist can be reached at any hour by calling [phone]. Do not use MyChart messaging for urgent concerns.    DIET:  We do recommend a small meal at first, but then you may proceed to your regular diet.  Drink plenty of fluids but you should avoid alcoholic beverages for 24  hours.  ACTIVITY:  You should plan to take it easy for the rest of today and you should NOT DRIVE or use heavy machinery until tomorrow (because of the sedation medicines used during the test).    FOLLOW UP: Our staff will call the number listed on your records the next business day following your procedure.  We will call around 7:15- 8:00 am to check on you and address any questions or concerns that you may have regarding the information given to you following your procedure. If we do not reach you, we will leave a message.     If any biopsies were taken you will be contacted by phone or by letter within the next 1-3 weeks.  Please call us  at [phone] if you have not heard about the biopsies in 3 weeks.    SIGNATURES/CONFIDENTIALITY: You and/or your care partner have signed paperwork which will be entered into your electronic medical record.  These signatures attest to the fact that that the information above on your After Visit Summary has been reviewed and is understood.  Full responsibility of the confidentiality of this discharge information lies with you and/or your care-partner.

## 2023-07-31 NOTE — Progress Notes (Signed)
 Called to room to assist during endoscopic procedure.  Patient ID and intended procedure confirmed with present staff. Received instructions for my participation in the procedure from the performing physician.

## 2023-07-31 NOTE — Progress Notes (Signed)
 GASTROENTEROLOGY PROCEDURE H&P NOTE   Primary Care Physician: Norleen Lynwood ORN, MD    Reason for Procedure:  History of colon polyps  Plan:    Colonoscopy  Patient is appropriate for endoscopic procedure(s) in the ambulatory ([facility]) setting.  The nature of the procedure, as well as the risks, benefits, and alternatives were carefully and thoroughly reviewed with the patient. Ample time for discussion and questions allowed. The patient understood, was satisfied, and agreed to proceed.     HPI: Anthony Steele is a 63 y.o. male who presents for surveillance colonoscopy 3 3.  Medical history as below.  Tolerated the prep.  No recent chest pain or shortness of breath.  No abdominal pain today.  Past Medical History:  Diagnosis Date   ANXIETY DISORDER, GENERALIZED 08/22/2006   Qualifier: Diagnosis of  By: Elaine MD, [name]     Carpal tunnel syndrome, right 05/31/2012   CERVICAL RADICULOPATHY 08/22/2006   Annotation: L arm 2002 Qualifier: Diagnosis of  By: Elaine MD, [name]     DEGENERATIVE DISC DISEASE 08/22/2006   Qualifier: Diagnosis of  By: Elaine MD, [name]     GENITAL HERPES 08/22/2006   Qualifier: Diagnosis of  By: Elaine MD, [name]     GERD 07/28/2006   Qualifier: Diagnosis of  By: Elaine MD, [name]     GERD (gastroesophageal reflux disease)    HEPATITIS B, CHRONIC 07/28/2006   Qualifier: Diagnosis of  By: Elaine MD, [name]     HIV DISEASE 07/28/2006   Annotation: dx 2000 Qualifier: Diagnosis of  By: Elaine MD, [name]     HIV infection [facility])    Hyperlipidemia    Hypertension    HYPERTENSION NEC 07/07/2009   Qualifier: Diagnosis of  By: Elaine MD, [name]     Kidney stones    NEPHROLITHIASIS 08/22/2006   Annotation: x2, last 1992 Qualifier: Diagnosis of  By: Elaine MD, [name]     Obesity 09/12/2011   PSORIASIS 08/22/2006   Qualifier: Diagnosis of  By: Elaine MD, [name]      Past Surgical History:  Procedure Laterality Date   COLONOSCOPY     CYSTECTOMY  2000   cyst removed off  buttock cheek    Prior to Admission medications   Medication Sig Start Date End Date Taking? Authorizing Provider  Darunavir -Cobicistat-Emtricitabine -Tenofovir  Alafenamide (SYMTUZA ) 800-150-200-10 MG TABS Take 1 tablet by mouth daily with breakfast. 03/22/23  Yes Comer, Lamar ORN, MD  esomeprazole (NEXIUM) 40 MG capsule TAKE 1 CAPSULE (40 MG TOTAL) BY MOUTH DAILY. 06/20/23  Yes Norleen Lynwood ORN, MD  ezetimibe  (ZETIA ) 10 MG tablet Take 1 tablet (10 mg total) by mouth daily. 09/06/22  Yes Nahser, Aleene PARAS, MD  fexofenadine  (ALLEGRA ) 180 MG tablet TAKE 1 TABLET BY MOUTH EVERY DAY 06/28/23  Yes Norleen Lynwood ORN, MD  rosuvastatin  (CRESTOR ) 20 MG tablet Take 1 tablet (20 mg total) by mouth daily. Please call office to schedule an appt for further refills. Thank you 07/14/23  Yes Nahser, Aleene PARAS, MD  valACYclovir  (VALTREX ) 500 MG tablet TAKE 1 TABLET BY MOUTH TWICE A DAY 02/23/23  Yes Norleen Lynwood ORN, MD  aspirin 81 MG tablet Take 81 mg by mouth daily. Patient not taking: Reported on 07/31/2023    [provider]  pantoprazole  (PROTONIX ) 40 MG tablet Take 1 tablet by mouth daily. Patient not taking: Reported on 03/22/2023 02/17/22   [provider]  triamcinolone  (NASACORT ) 55 MCG/ACT AERO nasal inhaler Place 2 sprays into the nose daily. 10/07/22  Norleen Lynwood ORN, MD    Current Outpatient Medications  Medication Sig Dispense Refill   Darunavir -Cobicistat-Emtricitabine -Tenofovir  Alafenamide (SYMTUZA ) 800-150-200-10 MG TABS Take 1 tablet by mouth daily with breakfast. 30 tablet 11   esomeprazole (NEXIUM) 40 MG capsule TAKE 1 CAPSULE (40 MG TOTAL) BY MOUTH DAILY. 30 capsule 11   ezetimibe  (ZETIA ) 10 MG tablet Take 1 tablet (10 mg total) by mouth daily. 90 tablet 3   fexofenadine  (ALLEGRA ) 180 MG tablet TAKE 1 TABLET BY MOUTH EVERY DAY 90 tablet 1   rosuvastatin  (CRESTOR ) 20 MG tablet Take 1 tablet (20 mg total) by mouth daily. Please call office to schedule an appt for further refills. Thank you 15  tablet 0   valACYclovir  (VALTREX ) 500 MG tablet TAKE 1 TABLET BY MOUTH TWICE A DAY 60 tablet 5   aspirin 81 MG tablet Take 81 mg by mouth daily. (Patient not taking: Reported on 07/31/2023)     pantoprazole  (PROTONIX ) 40 MG tablet Take 1 tablet by mouth daily. (Patient not taking: Reported on 03/22/2023)     triamcinolone  (NASACORT ) 55 MCG/ACT AERO nasal inhaler Place 2 sprays into the nose daily. 1 each 12   Current Facility-Administered Medications  Medication Dose Route Frequency Provider Last Rate Last Admin   0.9 %  sodium chloride  infusion  500 mL Intravenous Once Koy Lamp, Gordy HERO, MD        Allergies as of 07/31/2023 - Review Complete 07/31/2023  Allergen Reaction Noted   Lisinopril  Rash 03/13/2012    Family History  Problem Relation Age of Onset   Heart failure Mother    Colon polyps Sister    Diabetes Maternal Aunt    Heart disease Other    Hypertension Other    Arthritis Other    Cancer Other        colon cancer   Colon cancer Neg Hx    Esophageal cancer Neg Hx    Stomach cancer Neg Hx    Rectal cancer Neg Hx     Social History   Socioeconomic History   Marital status: Single    Spouse name: Not on file   Number of children: Not on file   Years of education: Not on file   Highest education level: Not on file  Occupational History   Not on file  Tobacco Use   Smoking status: Former    Current packs/day: 0.00    Types: Cigarettes    Quit date: 07/18/2012    Years since quitting: 11.0   Smokeless tobacco: Never   Tobacco comments:    quit over cruise over the holidays  Vaping Use   Vaping status: Never Used  Substance and Sexual Activity   Alcohol use: Yes    Alcohol/week: 3.0 - 5.0 standard drinks of alcohol    Types: 3 - 5 Standard drinks or equivalent per week    Comment: occ   Drug use: No   Sexual activity: Not Currently    Comment: decline condoms  Other Topics Concern   Not on file  Social History Narrative   Not on file   Social Drivers of  Health   Financial Resource Strain: Not on file  Food Insecurity: Not on file  Transportation Needs: Not on file  Physical Activity: Not on file  Stress: Not on file  Social Connections: Not on file  Intimate Partner Violence: Not on file    Physical Exam: Vital signs in last 24 hours: @BP  (!) 151/92   Pulse (!) 56  Temp 98.3 F (36.8 C) (Temporal)   Ht 5' 11 (1.803 m)   Wt 195 lb (88.5 kg)   SpO2 98%   BMI 27.20 kg/m  GEN: NAD EYE: Sclerae anicteric ENT: MMM CV: Non-tachycardic Pulm: CTA b/l GI: Soft, NT/ND NEURO:  Alert & Oriented x 3   Gordy Starch, MD [facility]  07/31/2023 8:34 AM

## 2023-08-01 ENCOUNTER — Other Ambulatory Visit: Payer: Self-pay | Admitting: Cardiovascular Disease

## 2023-08-01 ENCOUNTER — Telehealth: Payer: Self-pay

## 2023-08-01 ENCOUNTER — Other Ambulatory Visit: Payer: Self-pay

## 2023-08-01 DIAGNOSIS — Z8249 Family history of ischemic heart disease and other diseases of the circulatory system: Secondary | ICD-10-CM

## 2023-08-01 DIAGNOSIS — I2510 Atherosclerotic heart disease of native coronary artery without angina pectoris: Secondary | ICD-10-CM

## 2023-08-01 DIAGNOSIS — Z79899 Other long term (current) drug therapy: Secondary | ICD-10-CM

## 2023-08-01 DIAGNOSIS — E782 Mixed hyperlipidemia: Secondary | ICD-10-CM

## 2023-08-01 NOTE — Telephone Encounter (Signed)
  Follow up Call-     07/31/2023    8:03 AM  Call back number  Post procedure Call Back phone  # [phone]  Permission to leave phone message Yes     Patient questions:  Do you have a fever, pain , or abdominal swelling? No. Pain Score  0 *  Have you tolerated food without any problems? Yes.    Have you been able to return to your normal activities? Yes.    Do you have any questions about your discharge instructions: Diet   No. Medications  No. Follow up visit  No.  Do you have questions or concerns about your Care? No.  Actions: * If pain score is 4 or above: No action needed, pain <4.

## 2023-08-02 ENCOUNTER — Encounter: Payer: Self-pay | Admitting: Internal Medicine

## 2023-08-02 LAB — SURGICAL PATHOLOGY

## 2023-08-04 ENCOUNTER — Other Ambulatory Visit (HOSPITAL_COMMUNITY): Payer: Self-pay

## 2023-08-04 DIAGNOSIS — M5451 Vertebrogenic low back pain: Secondary | ICD-10-CM | POA: Diagnosis not present

## 2023-08-04 DIAGNOSIS — M48062 Spinal stenosis, lumbar region with neurogenic claudication: Secondary | ICD-10-CM | POA: Diagnosis not present

## 2023-08-04 DIAGNOSIS — M4186 Other forms of scoliosis, lumbar region: Secondary | ICD-10-CM | POA: Diagnosis not present

## 2023-08-11 ENCOUNTER — Other Ambulatory Visit: Payer: Self-pay

## 2023-08-11 ENCOUNTER — Other Ambulatory Visit: Payer: Self-pay | Admitting: Cardiovascular Disease

## 2023-08-11 ENCOUNTER — Other Ambulatory Visit: Payer: Self-pay | Admitting: Internal Medicine

## 2023-08-11 DIAGNOSIS — Z79899 Other long term (current) drug therapy: Secondary | ICD-10-CM

## 2023-08-11 DIAGNOSIS — E782 Mixed hyperlipidemia: Secondary | ICD-10-CM

## 2023-08-11 DIAGNOSIS — A6000 Herpesviral infection of urogenital system, unspecified: Secondary | ICD-10-CM

## 2023-08-13 ENCOUNTER — Other Ambulatory Visit: Payer: Self-pay | Admitting: Cardiovascular Disease

## 2023-08-13 DIAGNOSIS — Z79899 Other long term (current) drug therapy: Secondary | ICD-10-CM

## 2023-08-13 DIAGNOSIS — Z8249 Family history of ischemic heart disease and other diseases of the circulatory system: Secondary | ICD-10-CM

## 2023-08-13 DIAGNOSIS — I2510 Atherosclerotic heart disease of native coronary artery without angina pectoris: Secondary | ICD-10-CM

## 2023-08-13 DIAGNOSIS — E782 Mixed hyperlipidemia: Secondary | ICD-10-CM

## 2023-08-15 ENCOUNTER — Other Ambulatory Visit: Payer: Self-pay | Admitting: Cardiovascular Disease

## 2023-08-15 DIAGNOSIS — Z79899 Other long term (current) drug therapy: Secondary | ICD-10-CM

## 2023-08-15 DIAGNOSIS — Z8249 Family history of ischemic heart disease and other diseases of the circulatory system: Secondary | ICD-10-CM

## 2023-08-15 DIAGNOSIS — E782 Mixed hyperlipidemia: Secondary | ICD-10-CM

## 2023-08-15 DIAGNOSIS — I2510 Atherosclerotic heart disease of native coronary artery without angina pectoris: Secondary | ICD-10-CM

## 2023-08-15 NOTE — Telephone Encounter (Signed)
Pt's pharmacy is requesting a reill on rosuvastatin. Pt has had 3 attempts to schedule overdue appt and pt has not done so. Would Dr. Elease Hashimoto like to refill this medication without pt making an appt? Please address

## 2023-08-17 DIAGNOSIS — M5416 Radiculopathy, lumbar region: Secondary | ICD-10-CM | POA: Diagnosis not present

## 2023-08-21 ENCOUNTER — Other Ambulatory Visit: Payer: Self-pay

## 2023-08-21 NOTE — Progress Notes (Signed)
Specialty Pharmacy Ongoing Clinical Assessment Note  Anthony Steele is a 63 y.o. male who is being followed by the specialty pharmacy service for RxSp HIV   Patient's specialty medication(s) reviewed today: Darun-Cobic-Emtricit-TenofAF (Symtuza)   Missed doses in the last 4 weeks: 0   Patient/Caregiver did not have any additional questions or concerns.   Therapeutic benefit summary: Patient is achieving benefit (04/15/23 HIV RNA Not Detected.)   Adverse events/side effects summary: No adverse events/side effects   Patient's therapy is appropriate to: Continue    Goals Addressed             This Visit's Progress    Achieve Undetectable HIV Viral Load < 20       Patient is on track. Patient will maintain adherence         Follow up:  6 months  Bobette Mo Specialty Pharmacist

## 2023-08-21 NOTE — Progress Notes (Signed)
Specialty Pharmacy Refill Coordination Note  Anthony Steele is a 63 y.o. male contacted today regarding refills of specialty medication(s) Darun-Cobic-Emtricit-TenofAF [name])   Patient requested [name] at [facility] at [place] date: 08/28/23   Medication will be filled on 08/25/23.

## 2023-08-22 DIAGNOSIS — M48062 Spinal stenosis, lumbar region with neurogenic claudication: Secondary | ICD-10-CM | POA: Diagnosis not present

## 2023-08-22 DIAGNOSIS — M5416 Radiculopathy, lumbar region: Secondary | ICD-10-CM | POA: Diagnosis not present

## 2023-08-25 ENCOUNTER — Other Ambulatory Visit: Payer: Self-pay

## 2023-08-29 ENCOUNTER — Other Ambulatory Visit (HOSPITAL_COMMUNITY): Payer: Self-pay

## 2023-08-29 ENCOUNTER — Encounter: Payer: Self-pay | Admitting: Cardiovascular Disease

## 2023-08-29 ENCOUNTER — Encounter: Payer: Self-pay | Admitting: Internal Medicine

## 2023-08-29 DIAGNOSIS — H538 Other visual disturbances: Secondary | ICD-10-CM

## 2023-09-04 ENCOUNTER — Other Ambulatory Visit: Payer: Self-pay | Admitting: Cardiovascular Disease

## 2023-09-04 DIAGNOSIS — Z79899 Other long term (current) drug therapy: Secondary | ICD-10-CM

## 2023-09-04 DIAGNOSIS — E782 Mixed hyperlipidemia: Secondary | ICD-10-CM

## 2023-09-06 DIAGNOSIS — M48062 Spinal stenosis, lumbar region with neurogenic claudication: Secondary | ICD-10-CM | POA: Diagnosis not present

## 2023-09-06 DIAGNOSIS — M4180 Other forms of scoliosis, site unspecified: Secondary | ICD-10-CM | POA: Diagnosis not present

## 2023-09-07 DIAGNOSIS — M5416 Radiculopathy, lumbar region: Secondary | ICD-10-CM | POA: Diagnosis not present

## 2023-09-18 ENCOUNTER — Other Ambulatory Visit (HOSPITAL_COMMUNITY): Payer: Self-pay | Admitting: Neurological Surgery

## 2023-09-18 ENCOUNTER — Ambulatory Visit (HOSPITAL_COMMUNITY)
Admission: RE | Admit: 2023-09-18 | Discharge: 2023-09-18 | Disposition: A | Discharge status: Home / Self Care | Payer: 59 | Source: Ambulatory Visit | Attending: Neurological Surgery | Admitting: Neurological Surgery

## 2023-09-18 DIAGNOSIS — G9520 Unspecified cord compression: Secondary | ICD-10-CM | POA: Diagnosis not present

## 2023-09-18 DIAGNOSIS — M5114 Intervertebral disc disorders with radiculopathy, thoracic region: Secondary | ICD-10-CM | POA: Diagnosis not present

## 2023-09-18 DIAGNOSIS — M5414 Radiculopathy, thoracic region: Secondary | ICD-10-CM | POA: Insufficient documentation

## 2023-09-18 DIAGNOSIS — M539 Dorsopathy, unspecified: Secondary | ICD-10-CM | POA: Diagnosis not present

## 2023-09-18 DIAGNOSIS — M5416 Radiculopathy, lumbar region: Secondary | ICD-10-CM | POA: Diagnosis not present

## 2023-09-18 DIAGNOSIS — M48062 Spinal stenosis, lumbar region with neurogenic claudication: Secondary | ICD-10-CM | POA: Diagnosis not present

## 2023-09-18 DIAGNOSIS — M4804 Spinal stenosis, thoracic region: Secondary | ICD-10-CM | POA: Diagnosis not present

## 2023-09-18 DIAGNOSIS — M21372 Foot drop, left foot: Secondary | ICD-10-CM | POA: Diagnosis not present

## 2023-09-19 ENCOUNTER — Encounter: Payer: Self-pay | Admitting: Cardiovascular Disease

## 2023-09-19 ENCOUNTER — Other Ambulatory Visit: Payer: Self-pay | Admitting: Neurological Surgery

## 2023-09-19 ENCOUNTER — Encounter: Payer: Self-pay | Admitting: Internal Medicine

## 2023-09-19 DIAGNOSIS — M21372 Foot drop, left foot: Secondary | ICD-10-CM | POA: Diagnosis not present

## 2023-09-19 DIAGNOSIS — Z6828 Body mass index (BMI) 28.0-28.9, adult: Secondary | ICD-10-CM | POA: Diagnosis not present

## 2023-09-19 DIAGNOSIS — M5416 Radiculopathy, lumbar region: Secondary | ICD-10-CM | POA: Diagnosis not present

## 2023-09-19 DIAGNOSIS — M4714 Other spondylosis with myelopathy, thoracic region: Secondary | ICD-10-CM | POA: Diagnosis not present

## 2023-09-20 ENCOUNTER — Encounter (HOSPITAL_COMMUNITY): Payer: Self-pay

## 2023-09-20 NOTE — Progress Notes (Signed)
 Surgical Instructions    Your procedure is scheduled on Friday, 09/22/23.  Report to [facility] Main Entrance "A" at 11 A.M., then check in with the Admitting office.  Call this number if you have problems the morning of surgery:  [phone]   If you have any questions prior to your surgery date call [phone]: Open Monday-Friday 8am-4pm If you experience any cold or flu symptoms such as cough, fever, chills, shortness of breath, etc. between now and your scheduled surgery, please notify us at the above number     Remember:  Do not eat after midnight the night before your surgery-Thursday.  You may drink clear liquids until 10 AM, the morning of your surgery-Friday.   Clear liquids allowed are: Water, Non-Citrus Juices (without pulp), Carbonated Beverages, Clear Tea, Black Coffee ONLY (NO MILK, CREAM OR POWDERED CREAMER of any kind), and Gatorade    Take these medicines the morning of surgery with A SIP OF WATER:  Darunavir-Cobicistat-Emtricitabine-Tenofovir Alafenamide (SYMTUZA)  ezetimibe (ZETIA)  fexofenadine (ALLEGRA)   omeprazole (PRILOSEC OTC)  rosuvastatin (CRESTOR)  triamcinolone (NASACORT) nasal inhaler valACYclovir (VALTREX)     As of today, STOP taking any Aspirin (unless otherwise instructed by your surgeon) Aleve, Naproxen, Ibuprofen, Motrin, Advil, Goody's, BC's, all herbal medications, fish oil, and all vitamins.           Do not wear jewelry  Do not wear lotions, powders, cologne or deodorant. Men may shave face and neck. Do not bring valuables to the hospital.   [facility] is not responsible for any belongings or valuables.    Do NOT Smoke (Tobacco/Vaping)  24 hours prior to your procedure  If you use a CPAP at night, you may bring your mask for your overnight stay.   Contacts, glasses, hearing aids, dentures or partials may not be worn into surgery, please bring cases for these belongings   For patients admitted to the hospital, discharge time will  be determined by your treatment team.   Patients discharged the day of surgery will not be allowed to drive home, and someone needs to stay with them for 24 hours.   SURGICAL WAITING ROOM VISITATION Patients having surgery or a procedure may have no more than 2 support people in the waiting area - these visitors may rotate.   Children under the age of 50 must have an adult with them who is not the patient. If the patient needs to stay at the hospital during part of their recovery, the visitor guidelines for inpatient rooms apply. Pre-op nurse will coordinate an appropriate time for 1 support person to accompany patient in pre-op.  This support person may not rotate.   Please refer to [email or web address] for the visitor guidelines for Inpatients (after your surgery is over and you are in a regular room).    Special instructions:    Oral Hygiene is also important to reduce your risk of infection.  Remember - BRUSH YOUR TEETH THE MORNING OF SURGERY WITH YOUR REGULAR TOOTHPASTE   [facility]- Preparing For Surgery  Before surgery, you can play an important role. Because skin is not sterile, your skin needs to be as free of germs as possible. You can reduce the number of germs on your skin by washing with CHG (chlorahexidine gluconate) Soap before surgery.  CHG is an antiseptic cleaner which kills germs and bonds with the skin to continue killing germs even after washing.     Please do not use if you have an allergy to  CHG or antibacterial soaps. If your skin becomes reddened/irritated stop using the CHG.  Do not shave (including legs and underarms) for at least 48 hours prior to first CHG shower. It is OK to shave your face.  Please follow these instructions carefully.     Shower the NIGHT BEFORE SURGERY-Thurs and the MORNING OF SURGERY-Fri with CHG Soap.   If you chose to wash your hair, wash your hair first as usual with your normal  shampoo. After you shampoo, rinse your hair and body thoroughly to remove the shampoo.  Then [employer] and genitals (private parts) with your normal soap and rinse thoroughly to remove soap.  After that Use CHG Soap as you would any other liquid soap. You can apply CHG directly to the skin and wash gently with a scrungie or a clean washcloth.   Apply the CHG Soap to your body ONLY FROM THE NECK DOWN.  Do not use on open wounds or open sores. Avoid contact with your eyes, ears, mouth and genitals (private parts). Wash Face and genitals (private parts)  with your normal soap.   Wash thoroughly, paying special attention to the area where your surgery will be performed.  Thoroughly rinse your body with warm water from the neck down.  DO NOT shower/wash with your normal soap after using and rinsing off the CHG Soap.  Pat yourself dry with a CLEAN TOWEL.  Wear CLEAN PAJAMAS to bed the night before surgery  Place CLEAN SHEETS on your bed the night before your surgery  DO NOT SLEEP WITH PETS.   Day of Surgery:  Take a shower with CHG soap. Wear Clean/Comfortable clothing the morning of surgery Do not apply any deodorants/lotions.   Remember to brush your teeth WITH YOUR REGULAR TOOTHPASTE.    If you received a COVID test during your pre-op visit, it is requested that you wear a mask when out in public, stay away from anyone that may not be feeling well, and notify your surgeon if you develop symptoms. If you have been in contact with anyone that has tested positive in the last 10 days, please notify your surgeon.    Please read over the following fact sheets that you were given.

## 2023-09-21 ENCOUNTER — Other Ambulatory Visit: Payer: Self-pay

## 2023-09-21 ENCOUNTER — Encounter (HOSPITAL_COMMUNITY)
Admission: RE | Admit: 2023-09-21 | Discharge: 2023-09-21 | Disposition: A | Discharge status: Home / Self Care | Payer: 59 | Source: Ambulatory Visit | Attending: Neurological Surgery | Admitting: Neurological Surgery

## 2023-09-21 ENCOUNTER — Encounter (HOSPITAL_COMMUNITY): Payer: Self-pay

## 2023-09-21 VITALS — BP 139/89 | HR 59 | Temp 98.4°F | Resp 20 | Ht 71.0 in | Wt 203.3 lb

## 2023-09-21 DIAGNOSIS — Z01818 Encounter for other preprocedural examination: Secondary | ICD-10-CM

## 2023-09-21 DIAGNOSIS — H2511 Age-related nuclear cataract, right eye: Secondary | ICD-10-CM | POA: Diagnosis not present

## 2023-09-21 DIAGNOSIS — Z01812 Encounter for preprocedural laboratory examination: Secondary | ICD-10-CM | POA: Diagnosis not present

## 2023-09-21 LAB — BASIC METABOLIC PANEL
Anion gap: 11 (ref 5–15)
BUN: 18 mg/dL (ref 8–23)
CO2: 28 mmol/L (ref 22–32)
Calcium: 9.6 mg/dL (ref 8.9–10.3)
Chloride: 101 mmol/L (ref 98–111)
Creatinine, Ser: 0.97 mg/dL (ref 0.61–1.24)
GFR, Estimated: >60 mL/min (ref >60)
Glucose, Bld: 90 mg/dL (ref 70–99)
Potassium: 4.2 mmol/L (ref 3.5–5.1)
Sodium: 140 mmol/L (ref 135–145)

## 2023-09-21 LAB — PROTIME-INR
INR: 1 (ref 0.8–1.2)
Prothrombin Time: 13.3 s (ref 11.4–15.2)

## 2023-09-21 LAB — CBC
HCT: 42.3 % (ref 39.0–52.0)
Hemoglobin: 14.2 g/dL (ref 13.0–17.0)
MCH: 34 pg (ref 26.0–34.0)
MCHC: 33.6 g/dL (ref 30.0–36.0)
MCV: 101.2 fL — ABNORMAL HIGH (ref 80.0–100.0)
Platelets: 194 10*3/uL (ref 150–400)
RBC: 4.18 MIL/uL — ABNORMAL LOW (ref 4.22–5.81)
RDW: 13.6 % (ref 11.5–15.5)
WBC: 8.4 10*3/uL (ref 4.0–10.5)
nRBC: 0 % (ref 0.0–0.2)

## 2023-09-21 LAB — SURGICAL PCR SCREEN
MRSA, PCR: NEGATIVE
Staphylococcus aureus: POSITIVE — AB

## 2023-09-21 NOTE — Progress Notes (Signed)
 PCP - Bayard Males Cardiologist - Jannette Spanner  PPM/ICD - denies Device Orders -  Rep Notified -   Chest x-ray - na EKG - 09/21/23 Stress Test - denies ECHO - 10/29/15 Cardiac Cath - denies  Sleep Study - denies CPAP -   Fasting Blood Sugar - na Checks Blood Sugar _____ times a day  Last dose of GLP1 agonist-  na GLP1 instructions:   Blood Thinner Instructions:na Aspirin Instructions:na  ERAS Protcol -clears until 1000 PRE-SURGERY Ensure or G2- na  COVID TEST- na   Anesthesia review: no  Patient denies shortness of breath, fever, cough and chest pain at PAT appointment   All instructions explained to the patient, with a verbal understanding of the material. Patient agrees to go over the instructions while at home for a better understanding. Patient also instructed to wear a mask when out in public prior to surgery. The opportunity to ask questions was provided.

## 2023-09-21 NOTE — Progress Notes (Signed)
 Specialty Pharmacy Refill Coordination Note  Anthony Steele is a 63 y.o. male contacted today regarding refills of specialty medication(s) Darun-Cobic-Emtricit-TenofAF [name])   Patient requested Delivery   Delivery date: 09/29/23   Verified address: [address], [place], [address]   Medication will be filled on 09/28/23.

## 2023-09-21 NOTE — Progress Notes (Signed)
 Surgical Instructions   Your procedure is scheduled on Friday February 28. Report to [facility] Main Entrance "A" at 11 A.M., then check in with the Admitting office. Any questions or running late day of surgery: call [phone]  Questions prior to your surgery date: call [phone], Monday-Friday, 8am-4pm. If you experience any cold or flu symptoms such as cough, fever, chills, shortness of breath, etc. between now and your scheduled surgery, please notify us at the above number.     Remember:  Do not eat after midnight the night before your surgery  You may drink clear liquids until 10am the morning of your surgery.   Clear liquids allowed are: Water, Non-Citrus Juices (without pulp), Carbonated Beverages, Clear Tea (no milk, honey, etc.), Black Coffee Only (NO MILK, CREAM OR POWDERED CREAMER of any kind), and Gatorade.    Take these medicines the morning of surgery with A SIP OF WATER  Darunavir-Cobicistat-Emtricitabine-Tenofovir Alafenamide (SYMTUZA)  ezetimibe (ZETIA)  fexofenadine (ALLEGRA)   omeprazole (PRILOSEC OTC)  rosuvastatin (CRESTOR)  triamcinolone (NASACORT) nasal inhaler valACYclovir (VALTREX)    One week prior to surgery, STOP taking any Aspirin (unless otherwise instructed by your surgeon) diclofenac (VOLTAREN),Aleve, Naproxen, Ibuprofen, Motrin, Advil, Goody's, BC's, all herbal medications, fish oil, and non-prescription vitamins.                     Do NOT Smoke (Tobacco/Vaping) for 24 hours prior to your procedure.  If you use a CPAP at night, you may bring your mask/headgear for your overnight stay.   You will be asked to remove any contacts, glasses, piercing's, hearing aid's, dentures/partials prior to surgery. Please bring cases for these items if needed.    Patients discharged the day of surgery will not be allowed to drive home, and someone needs to stay with them for 24 hours.  SURGICAL WAITING ROOM VISITATION Patients may have no more than 2  support people in the waiting area - these visitors may rotate.   Pre-op nurse will coordinate an appropriate time for 1 ADULT support person, who may not rotate, to accompany patient in pre-op.  Children under the age of 72 must have an adult with them who is not the patient and must remain in the main waiting area with an adult.  If the patient needs to stay at the hospital during part of their recovery, the visitor guidelines for inpatient rooms apply.  Please refer to the [facility] website for the visitor guidelines for any additional information.   If you received a COVID test during your pre-op visit  it is requested that you wear a mask when out in public, stay away from anyone that may not be feeling well and notify your surgeon if you develop symptoms. If you have been in contact with anyone that has tested positive in the last 10 days please notify you surgeon.      Pre-operative 5 CHG Bathing Instructions   You can play a key role in reducing the risk of infection after surgery. Your skin needs to be as free of germs as possible. You can reduce the number of germs on your skin by washing with CHG (chlorhexidine gluconate) soap before surgery. CHG is an antiseptic soap that kills germs and continues to kill germs even after washing.   DO NOT use if you have an allergy to chlorhexidine/CHG or antibacterial soaps. If your skin becomes reddened or irritated, stop using the CHG and notify one of our RNs at [phone].  Please shower with the CHG soap starting 4 days before surgery using the following schedule:     Please keep in mind the following:  DO NOT shave, including legs and underarms, starting the day of your first shower.   You may shave your face at any point before/day of surgery.  Place clean sheets on your bed the day you start using CHG soap. Use a clean washcloth (not used since being washed) for each shower. DO NOT sleep with pets once you start using the CHG.    CHG Shower Instructions:  Wash your face and private area with normal soap. If you choose to wash your hair, wash first with your normal shampoo.  After you use shampoo/soap, rinse your hair and body thoroughly to remove shampoo/soap residue.  Turn the water OFF and apply about 3 tablespoons (45 ml) of CHG soap to a CLEAN washcloth.  Apply CHG soap ONLY FROM YOUR NECK DOWN TO YOUR TOES (washing for 3-5 minutes)  DO NOT use CHG soap on face, private areas, open wounds, or sores.  Pay special attention to the area where your surgery is being performed.  If you are having back surgery, having someone wash your back for you may be helpful. Wait 2 minutes after CHG soap is applied, then you may rinse off the CHG soap.  Pat dry with a clean towel  Put on clean clothes/pajamas   If you choose to wear lotion, please use ONLY the CHG-compatible lotions that are listed below.  Additional instructions for the day of surgery: DO NOT APPLY any lotions, deodorants, cologne, or perfumes.   Do not bring valuables to the hospital. [facility] is not responsible for any belongings/valuables. Do not wear nail polish, gel polish, artificial nails, or any other type of covering on natural nails (fingers and toes) Do not wear jewelry or makeup Put on clean/comfortable clothes.  Please brush your teeth.  Ask your nurse before applying any prescription medications to the skin.     CHG Compatible Lotions   Aveeno Moisturizing lotion  Cetaphil Moisturizing Cream  Cetaphil Moisturizing Lotion  Clairol Herbal Essence Moisturizing Lotion, Dry Skin  Clairol Herbal Essence Moisturizing Lotion, Extra Dry Skin  Clairol Herbal Essence Moisturizing Lotion, Normal Skin  Curel Age Defying Therapeutic Moisturizing Lotion with Alpha Hydroxy  Curel Extreme Care Body Lotion  Curel Soothing Hands Moisturizing Hand Lotion  Curel Therapeutic Moisturizing Cream, Fragrance-Free  Curel Therapeutic Moisturizing Lotion,  Fragrance-Free  Curel Therapeutic Moisturizing Lotion, Original Formula  Eucerin Daily Replenishing Lotion  Eucerin Dry Skin Therapy Plus Alpha Hydroxy Crme  Eucerin Dry Skin Therapy Plus Alpha Hydroxy Lotion  Eucerin Original Crme  Eucerin Original Lotion  Eucerin Plus Crme Eucerin Plus Lotion  Eucerin TriLipid Replenishing Lotion  Keri Anti-Bacterial Hand Lotion  Keri Deep Conditioning Original Lotion Dry Skin Formula Softly Scented  Keri Deep Conditioning Original Lotion, Fragrance Free Sensitive Skin Formula  Keri Lotion Fast Absorbing Fragrance Free Sensitive Skin Formula  Keri Lotion Fast Absorbing Softly Scented Dry Skin Formula  Keri Original Lotion  Keri Skin Renewal Lotion Keri Silky Smooth Lotion  Keri Silky Smooth Sensitive Skin Lotion  Nivea Body Creamy Conditioning Oil  Nivea Body Extra Enriched Teacher, adult education Moisturizing Lotion Nivea Crme  Nivea Skin Firming Lotion  NutraDerm 30 Skin Lotion  NutraDerm Skin Lotion  NutraDerm Therapeutic Skin Cream  NutraDerm Therapeutic Skin Lotion  ProShield Protective Hand Cream  Provon moisturizing lotion  Please read  over the following fact sheets that you were given.

## 2023-09-22 ENCOUNTER — Ambulatory Visit (HOSPITAL_COMMUNITY): Payer: 59 | Admitting: Anesthesiology

## 2023-09-22 ENCOUNTER — Encounter (HOSPITAL_COMMUNITY): Payer: Self-pay | Admitting: Neurological Surgery

## 2023-09-22 ENCOUNTER — Observation Stay (HOSPITAL_COMMUNITY)
Admission: RE | Admit: 2023-09-22 | Discharge: 2023-09-23 | Disposition: A | Discharge status: Home / Self Care | Payer: 59 | Attending: Neurological Surgery | Admitting: Neurological Surgery

## 2023-09-22 ENCOUNTER — Ambulatory Visit (HOSPITAL_COMMUNITY): Payer: 59

## 2023-09-22 ENCOUNTER — Encounter (HOSPITAL_COMMUNITY)
Admission: RE | Disposition: A | Discharge status: Home / Self Care | Payer: Self-pay | Source: Home / Self Care | Attending: Neurological Surgery

## 2023-09-22 ENCOUNTER — Other Ambulatory Visit: Payer: Self-pay

## 2023-09-22 ENCOUNTER — Ambulatory Visit (HOSPITAL_BASED_OUTPATIENT_CLINIC_OR_DEPARTMENT_OTHER): Payer: 59 | Admitting: Anesthesiology

## 2023-09-22 DIAGNOSIS — M4805 Spinal stenosis, thoracolumbar region: Secondary | ICD-10-CM

## 2023-09-22 DIAGNOSIS — B20 Human immunodeficiency virus [HIV] disease: Secondary | ICD-10-CM | POA: Diagnosis not present

## 2023-09-22 DIAGNOSIS — M5125 Other intervertebral disc displacement, thoracolumbar region: Secondary | ICD-10-CM | POA: Diagnosis not present

## 2023-09-22 DIAGNOSIS — M47815 Spondylosis without myelopathy or radiculopathy, thoracolumbar region: Secondary | ICD-10-CM

## 2023-09-22 DIAGNOSIS — M47896 Other spondylosis, lumbar region: Secondary | ICD-10-CM | POA: Diagnosis not present

## 2023-09-22 DIAGNOSIS — I10 Essential (primary) hypertension: Secondary | ICD-10-CM | POA: Insufficient documentation

## 2023-09-22 DIAGNOSIS — M48061 Spinal stenosis, lumbar region without neurogenic claudication: Secondary | ICD-10-CM | POA: Insufficient documentation

## 2023-09-22 DIAGNOSIS — M4804 Spinal stenosis, thoracic region: Secondary | ICD-10-CM | POA: Diagnosis not present

## 2023-09-22 DIAGNOSIS — M5184 Other intervertebral disc disorders, thoracic region: Secondary | ICD-10-CM | POA: Insufficient documentation

## 2023-09-22 DIAGNOSIS — Z79899 Other long term (current) drug therapy: Secondary | ICD-10-CM | POA: Insufficient documentation

## 2023-09-22 DIAGNOSIS — M8580 Other specified disorders of bone density and structure, unspecified site: Secondary | ICD-10-CM | POA: Diagnosis not present

## 2023-09-22 DIAGNOSIS — Z87891 Personal history of nicotine dependence: Secondary | ICD-10-CM | POA: Insufficient documentation

## 2023-09-22 DIAGNOSIS — Z9889 Other specified postprocedural states: Principal | ICD-10-CM

## 2023-09-22 DIAGNOSIS — M47816 Spondylosis without myelopathy or radiculopathy, lumbar region: Secondary | ICD-10-CM | POA: Diagnosis not present

## 2023-09-22 SURGERY — LUMBAR LAMINECTOMY/DECOMPRESSION MICRODISCECTOMY 3 LEVELS
Anesthesia: General | Site: Back | Laterality: Left

## 2023-09-22 MED ORDER — THROMBIN 20000 UNITS EX SOLR
CUTANEOUS | Status: AC
  Filled 2023-09-22: qty 20000

## 2023-09-22 MED ORDER — CELECOXIB 200 MG PO CAPS
200.0000 mg | ORAL_CAPSULE | Freq: Two times a day (BID) | ORAL | Status: DC
  Administered 2023-09-22: 200 mg via ORAL
  Filled 2023-09-22: qty 1

## 2023-09-22 MED ORDER — ADULT MULTIVITAMIN W/MINERALS CH
1.0000 | ORAL_TABLET | Freq: Every day | ORAL | Status: DC
  Administered 2023-09-22: 1 via ORAL
  Filled 2023-09-22: qty 1

## 2023-09-22 MED ORDER — ORAL CARE MOUTH RINSE: 15.0000 mL | Freq: Once | OROMUCOSAL | Status: AC

## 2023-09-22 MED ORDER — PHENYLEPHRINE 80 MCG/ML (10ML) SYRINGE FOR IV PUSH (FOR BLOOD PRESSURE SUPPORT)
PREFILLED_SYRINGE | INTRAVENOUS | Status: DC | PRN
  Administered 2023-09-22 (×2): 80 ug via INTRAVENOUS

## 2023-09-22 MED ORDER — MORPHINE SULFATE (PF) 2 MG/ML IV SOLN: 2.0000 mg | INTRAVENOUS | Status: DC | PRN

## 2023-09-22 MED ORDER — CHLORHEXIDINE GLUCONATE 4 % EX SOLN: 1.0000 | CUTANEOUS | 1 refills | Status: DC

## 2023-09-22 MED ORDER — TIZANIDINE HCL 4 MG PO TABS
4.0000 mg | ORAL_TABLET | Freq: Three times a day (TID) | ORAL | Status: DC | PRN
  Administered 2023-09-22: 4 mg via ORAL
  Filled 2023-09-22 (×2): qty 1

## 2023-09-22 MED ORDER — MIDAZOLAM HCL 2 MG/2ML IJ SOLN
INTRAMUSCULAR | Status: DC | PRN
  Administered 2023-09-22: 2 mg via INTRAVENOUS

## 2023-09-22 MED ORDER — MIDAZOLAM HCL 2 MG/2ML IJ SOLN
INTRAMUSCULAR | Status: AC
  Filled 2023-09-22: qty 2

## 2023-09-22 MED ORDER — ALBUMIN HUMAN 5 % IV SOLN: INTRAVENOUS | Status: DC | PRN

## 2023-09-22 MED ORDER — SENNA 8.6 MG PO TABS
1.0000 | ORAL_TABLET | Freq: Two times a day (BID) | ORAL | Status: DC
  Administered 2023-09-22: 8.6 mg via ORAL
  Filled 2023-09-22: qty 1

## 2023-09-22 MED ORDER — ONDANSETRON HCL 4 MG/2ML IJ SOLN
INTRAMUSCULAR | Status: AC
  Filled 2023-09-22: qty 2

## 2023-09-22 MED ORDER — ONDANSETRON HCL 4 MG PO TABS: 4.0000 mg | ORAL_TABLET | Freq: Four times a day (QID) | ORAL | Status: DC | PRN

## 2023-09-22 MED ORDER — FENTANYL CITRATE (PF) 250 MCG/5ML IJ SOLN
INTRAMUSCULAR | Status: AC
Start: 2023-09-22 — End: ?
  Filled 2023-09-22: qty 5

## 2023-09-22 MED ORDER — THROMBIN 5000 UNITS EX SOLR
OROMUCOSAL | Status: DC | PRN
  Administered 2023-09-22: 5 mL via TOPICAL

## 2023-09-22 MED ORDER — CHLORHEXIDINE GLUCONATE CLOTH 2 % EX PADS: 6.0000 | MEDICATED_PAD | Freq: Every day | CUTANEOUS | Status: DC

## 2023-09-22 MED ORDER — DEXAMETHASONE SODIUM PHOSPHATE 10 MG/ML IJ SOLN
INTRAMUSCULAR | Status: DC | PRN
  Administered 2023-09-22: 10 mg via INTRAVENOUS

## 2023-09-22 MED ORDER — MUPIROCIN 2 % EX OINT: 1.0000 | TOPICAL_OINTMENT | Freq: Two times a day (BID) | CUTANEOUS | 0 refills | Status: AC

## 2023-09-22 MED ORDER — DEXAMETHASONE SODIUM PHOSPHATE 10 MG/ML IJ SOLN
INTRAMUSCULAR | Status: AC
  Filled 2023-09-22: qty 1

## 2023-09-22 MED ORDER — GABAPENTIN 300 MG PO CAPS
300.0000 mg | ORAL_CAPSULE | ORAL | Status: AC
  Administered 2023-09-22: 300 mg via ORAL
  Filled 2023-09-22: qty 1

## 2023-09-22 MED ORDER — CHLORHEXIDINE GLUCONATE CLOTH 2 % EX PADS: 6.0000 | MEDICATED_PAD | Freq: Once | CUTANEOUS | Status: DC

## 2023-09-22 MED ORDER — SODIUM CHLORIDE 0.9% FLUSH: 3.0000 mL | INTRAVENOUS | Status: DC | PRN

## 2023-09-22 MED ORDER — OXYCODONE HCL 5 MG PO TABS
10.0000 mg | ORAL_TABLET | ORAL | Status: DC | PRN
  Administered 2023-09-22 – 2023-09-23 (×5): 10 mg via ORAL
  Filled 2023-09-22 (×5): qty 2

## 2023-09-22 MED ORDER — PANTOPRAZOLE SODIUM 40 MG PO TBEC
40.0000 mg | DELAYED_RELEASE_TABLET | Freq: Every day | ORAL | Status: DC
  Administered 2023-09-22: 40 mg via ORAL
  Filled 2023-09-22: qty 1

## 2023-09-22 MED ORDER — CELECOXIB 200 MG PO CAPS
200.0000 mg | ORAL_CAPSULE | Freq: Once | ORAL | Status: AC
  Administered 2023-09-22: 200 mg via ORAL
  Filled 2023-09-22: qty 1

## 2023-09-22 MED ORDER — DEXAMETHASONE 4 MG PO TABS
4.0000 mg | ORAL_TABLET | Freq: Four times a day (QID) | ORAL | Status: DC
  Administered 2023-09-22 – 2023-09-23 (×3): 4 mg via ORAL
  Filled 2023-09-22 (×3): qty 1

## 2023-09-22 MED ORDER — THROMBIN 5000 UNITS EX KIT
PACK | CUTANEOUS | Status: AC
  Filled 2023-09-22: qty 1

## 2023-09-22 MED ORDER — PROPOFOL 10 MG/ML IV BOLUS
INTRAVENOUS | Status: DC | PRN
  Administered 2023-09-22: 170 mg via INTRAVENOUS

## 2023-09-22 MED ORDER — THROMBIN 20000 UNITS EX SOLR
CUTANEOUS | Status: DC | PRN
  Administered 2023-09-22: 20 mL via TOPICAL

## 2023-09-22 MED ORDER — SODIUM CHLORIDE 0.9% FLUSH
3.0000 mL | Freq: Two times a day (BID) | INTRAVENOUS | Status: DC
  Administered 2023-09-22: 3 mL via INTRAVENOUS

## 2023-09-22 MED ORDER — ACETAMINOPHEN 500 MG PO TABS
1000.0000 mg | ORAL_TABLET | Freq: Four times a day (QID) | ORAL | Status: DC
  Administered 2023-09-22 – 2023-09-23 (×3): 1000 mg via ORAL
  Filled 2023-09-22 (×3): qty 2

## 2023-09-22 MED ORDER — ROCURONIUM BROMIDE 10 MG/ML (PF) SYRINGE
PREFILLED_SYRINGE | INTRAVENOUS | Status: DC | PRN
  Administered 2023-09-22: 60 mg via INTRAVENOUS
  Administered 2023-09-22: 40 mg via INTRAVENOUS
  Administered 2023-09-22: 10 mg via INTRAVENOUS
  Administered 2023-09-22: 50 mg via INTRAVENOUS

## 2023-09-22 MED ORDER — DEXAMETHASONE SODIUM PHOSPHATE 4 MG/ML IJ SOLN
4.0000 mg | Freq: Four times a day (QID) | INTRAMUSCULAR | Status: DC
Start: 2023-09-22 — End: 2023-09-23

## 2023-09-22 MED ORDER — EPHEDRINE SULFATE-NACL 50-0.9 MG/10ML-% IV SOSY
PREFILLED_SYRINGE | INTRAVENOUS | Status: DC | PRN
  Administered 2023-09-22: 10 mg via INTRAVENOUS

## 2023-09-22 MED ORDER — EZETIMIBE 10 MG PO TABS
10.0000 mg | ORAL_TABLET | Freq: Every day | ORAL | Status: DC
Start: 2023-09-22 — End: 2023-09-23
  Administered 2023-09-22: 10 mg via ORAL
  Filled 2023-09-22 (×2): qty 1

## 2023-09-22 MED ORDER — FENTANYL CITRATE (PF) 100 MCG/2ML IJ SOLN: 25.0000 ug | INTRAMUSCULAR | Status: DC | PRN

## 2023-09-22 MED ORDER — ACETAMINOPHEN 500 MG PO TABS
1000.0000 mg | ORAL_TABLET | Freq: Once | ORAL | Status: DC
Start: 2023-09-22 — End: 2023-09-22

## 2023-09-22 MED ORDER — SODIUM CHLORIDE 0.9 % IV SOLN
250.0000 mL | INTRAVENOUS | Status: DC
  Administered 2023-09-22: 250 mL via INTRAVENOUS

## 2023-09-22 MED ORDER — DARUN-COBIC-EMTRICIT-TENOFAF 800-150-200-10 MG PO TABS
1.0000 | ORAL_TABLET | Freq: Every day | ORAL | Status: DC
  Administered 2023-09-23: 1 via ORAL
  Filled 2023-09-22: qty 1

## 2023-09-22 MED ORDER — MUPIROCIN 2 % EX OINT
1.0000 | TOPICAL_OINTMENT | Freq: Two times a day (BID) | CUTANEOUS | Status: DC
  Administered 2023-09-22: 1 via NASAL
  Filled 2023-09-22: qty 22

## 2023-09-22 MED ORDER — MENTHOL 3 MG MT LOZG: 1.0000 | LOZENGE | OROMUCOSAL | Status: DC | PRN

## 2023-09-22 MED ORDER — LIDOCAINE 2% (20 MG/ML) 5 ML SYRINGE
INTRAMUSCULAR | Status: AC
  Filled 2023-09-22: qty 5

## 2023-09-22 MED ORDER — PHENOL 1.4 % MT LIQD: 1.0000 | OROMUCOSAL | Status: DC | PRN

## 2023-09-22 MED ORDER — 0.9 % SODIUM CHLORIDE (POUR BTL) OPTIME
TOPICAL | Status: DC | PRN
  Administered 2023-09-22 (×2): 1000 mL

## 2023-09-22 MED ORDER — DOCUSATE SODIUM 100 MG PO CAPS: 100.0000 mg | ORAL_CAPSULE | Freq: Two times a day (BID) | ORAL | Status: DC | PRN

## 2023-09-22 MED ORDER — ONDANSETRON HCL 4 MG/2ML IJ SOLN: 4.0000 mg | Freq: Four times a day (QID) | INTRAMUSCULAR | Status: DC | PRN

## 2023-09-22 MED ORDER — PHENYLEPHRINE HCL-NACL 20-0.9 MG/250ML-% IV SOLN
INTRAVENOUS | Status: DC | PRN
  Administered 2023-09-22: 40 ug/min via INTRAVENOUS

## 2023-09-22 MED ORDER — VALACYCLOVIR HCL 500 MG PO TABS
500.0000 mg | ORAL_TABLET | Freq: Two times a day (BID) | ORAL | Status: DC
  Administered 2023-09-22: 500 mg via ORAL
  Filled 2023-09-22: qty 1

## 2023-09-22 MED ORDER — ACETAMINOPHEN 500 MG PO TABS
1000.0000 mg | ORAL_TABLET | ORAL | Status: AC
  Administered 2023-09-22: 1000 mg via ORAL
  Filled 2023-09-22: qty 2

## 2023-09-22 MED ORDER — SUGAMMADEX SODIUM 200 MG/2ML IV SOLN
INTRAVENOUS | Status: DC | PRN
  Administered 2023-09-22: 200 mg via INTRAVENOUS

## 2023-09-22 MED ORDER — LIDOCAINE 2% (20 MG/ML) 5 ML SYRINGE
INTRAMUSCULAR | Status: DC | PRN
  Administered 2023-09-22: 60 mg via INTRAVENOUS

## 2023-09-22 MED ORDER — BUPIVACAINE HCL (PF) 0.25 % IJ SOLN
INTRAMUSCULAR | Status: AC
  Filled 2023-09-22: qty 30

## 2023-09-22 MED ORDER — CEFAZOLIN SODIUM-DEXTROSE 2-4 GM/100ML-% IV SOLN
2.0000 g | Freq: Three times a day (TID) | INTRAVENOUS | Status: AC
  Administered 2023-09-22 – 2023-09-23 (×2): 2 g via INTRAVENOUS
  Filled 2023-09-22 (×2): qty 100

## 2023-09-22 MED ORDER — SUGAMMADEX SODIUM 200 MG/2ML IV SOLN
INTRAVENOUS | Status: AC
  Filled 2023-09-22: qty 4

## 2023-09-22 MED ORDER — ROCURONIUM BROMIDE 10 MG/ML (PF) SYRINGE
PREFILLED_SYRINGE | INTRAVENOUS | Status: AC
  Filled 2023-09-22: qty 10

## 2023-09-22 MED ORDER — CEFAZOLIN SODIUM-DEXTROSE 2-4 GM/100ML-% IV SOLN
2.0000 g | INTRAVENOUS | Status: AC
  Administered 2023-09-22: 2 g via INTRAVENOUS
  Filled 2023-09-22: qty 100

## 2023-09-22 MED ORDER — PROPOFOL 10 MG/ML IV BOLUS
INTRAVENOUS | Status: AC
  Filled 2023-09-22: qty 20

## 2023-09-22 MED ORDER — FENTANYL CITRATE (PF) 250 MCG/5ML IJ SOLN
INTRAMUSCULAR | Status: DC | PRN
Start: 2023-09-22 — End: 2023-09-22
  Administered 2023-09-22: 100 ug via INTRAVENOUS
  Administered 2023-09-22 (×3): 50 ug via INTRAVENOUS

## 2023-09-22 MED ORDER — ONDANSETRON HCL 4 MG/2ML IJ SOLN
INTRAMUSCULAR | Status: DC | PRN
  Administered 2023-09-22: 4 mg via INTRAVENOUS

## 2023-09-22 MED ORDER — LACTATED RINGERS IV SOLN: INTRAVENOUS | Status: DC | PRN

## 2023-09-22 MED ORDER — SODIUM CHLORIDE 0.9% FLUSH: 3.0000 mL | Freq: Two times a day (BID) | INTRAVENOUS | Status: DC

## 2023-09-22 MED ORDER — BUPIVACAINE HCL (PF) 0.25 % IJ SOLN
INTRAMUSCULAR | Status: DC | PRN
  Administered 2023-09-22: 10 mL

## 2023-09-22 MED ORDER — CHLORHEXIDINE GLUCONATE 0.12 % MT SOLN
15.0000 mL | Freq: Once | OROMUCOSAL | Status: AC
  Administered 2023-09-22: 15 mL via OROMUCOSAL
  Filled 2023-09-22: qty 15

## 2023-09-22 SURGICAL SUPPLY — 37 items
BAG COUNTER SPONGE SURGICOUNT (BAG) ×1 IMPLANT
BAND RUBBER #18 3X1/16 STRL (MISCELLANEOUS) ×2 IMPLANT
BENZOIN TINCTURE PRP APPL 2/3 (GAUZE/BANDAGES/DRESSINGS) ×1 IMPLANT
BUR CARBIDE MATCH 3.0 (BURR) ×1 IMPLANT
CANISTER SUCT 3000ML PPV (MISCELLANEOUS) ×1 IMPLANT
DERMABOND ADVANCED .7 DNX12 (GAUZE/BANDAGES/DRESSINGS) IMPLANT
DRAPE LAPAROTOMY 100X72X124 (DRAPES) ×1 IMPLANT
DRAPE MICROSCOPE SLANT 54X150 (MISCELLANEOUS) ×1 IMPLANT
DRAPE SURG 17X23 STRL (DRAPES) ×1 IMPLANT
DRSG OPSITE POSTOP 4X6 (GAUZE/BANDAGES/DRESSINGS) IMPLANT
DURAPREP 26ML APPLICATOR (WOUND CARE) ×1 IMPLANT
ELECT REM PT RETURN 9FT ADLT (ELECTROSURGICAL) ×1 IMPLANT
ELECTRODE REM PT RTRN 9FT ADLT (ELECTROSURGICAL) ×1 IMPLANT
GAUZE 4X4 16PLY ~~LOC~~+RFID DBL (SPONGE) IMPLANT
GLOVE BIO SURGEON STRL SZ7 (GLOVE) IMPLANT
GLOVE BIO SURGEON STRL SZ8 (GLOVE) ×1 IMPLANT
GLOVE BIOGEL PI IND STRL 7.0 (GLOVE) IMPLANT
GOWN STRL REUS W/ TWL LRG LVL3 (GOWN DISPOSABLE) IMPLANT
GOWN STRL REUS W/ TWL XL LVL3 (GOWN DISPOSABLE) ×1 IMPLANT
GOWN STRL REUS W/TWL 2XL LVL3 (GOWN DISPOSABLE) IMPLANT
HEMOSTAT POWDER KIT SURGIFOAM (HEMOSTASIS) ×1 IMPLANT
KIT BASIN OR (CUSTOM PROCEDURE TRAY) ×1 IMPLANT
KIT TURNOVER KIT B (KITS) ×1 IMPLANT
NDL HYPO 25X1 1.5 SAFETY (NEEDLE) ×1 IMPLANT
NDL SPNL 20GX3.5 QUINCKE YW (NEEDLE) IMPLANT
NEEDLE HYPO 25X1 1.5 SAFETY (NEEDLE) ×1 IMPLANT
NEEDLE SPNL 20GX3.5 QUINCKE YW (NEEDLE) IMPLANT
NS IRRIG 1000ML POUR BTL (IV SOLUTION) ×1 IMPLANT
PACK LAMINECTOMY NEURO (CUSTOM PROCEDURE TRAY) ×1 IMPLANT
PAD ARMBOARD 7.5X6 YLW CONV (MISCELLANEOUS) ×3 IMPLANT
STRIP CLOSURE SKIN 1/2X4 (GAUZE/BANDAGES/DRESSINGS) ×1 IMPLANT
SUT VIC AB 0 CT1 18XCR BRD8 (SUTURE) ×1 IMPLANT
SUT VIC AB 2-0 CP2 18 (SUTURE) ×1 IMPLANT
SUT VIC AB 3-0 SH 8-18 (SUTURE) ×1 IMPLANT
TOWEL GREEN STERILE (TOWEL DISPOSABLE) ×1 IMPLANT
TOWEL GREEN STERILE FF (TOWEL DISPOSABLE) ×1 IMPLANT
WATER STERILE IRR 1000ML POUR (IV SOLUTION) ×1 IMPLANT

## 2023-09-22 NOTE — H&P (Signed)
 Subjective: Patient is a 63 y.o. male admitted for severe back pain with left leg pain and a subacute foot drop. Onset of symptoms was several weeks ago, gradually worsening since that time, developed weakness in his left dorsiflexors about 10 days ago.  The pain is rated severe, and is located at the across the lower back and radiates to left lower extremity. The pain is described as aching and occurs all day. The symptoms have been progressive. Symptoms are exacerbated by exercise. MRI or CT showed large midline disc protrusion at T11-12 and severe spondylosis at L3-4 and L4-5 with stenosis and neural compression.  The symptoms started very acutely and involve an electrical feeling in both legs, suggesting that he was symptomatic from the T11-12 disc herniation.  He is not myelopathic on exam.  He then developed the left foot drop about 10 days ago when he has severe disease on the left at L3-4 and L4-5.  Therefore we recommend both T11-12 laminectomy and an attempt at microdiscectomy, and a left L3-4 and L4-5 hemilaminectomy.  Because he is not myelopathic, he did not recommend costotransversectomy or transthoracic approach, but feel that we can get adequate decompression with a posterior decompression and a trial at removal of what ever soft disc we can remove from a posterior lateral approach.  Patient understands this.  The foot drop may be from cord compression but is more likely from the disease at L3-4 and L4-5.  Past Medical History:  Diagnosis Date   ANXIETY DISORDER, GENERALIZED 08/22/2006   Qualifier: Diagnosis of  By: Orvan Falconer MD, [name]     Carpal tunnel syndrome, right 05/31/2012   CERVICAL RADICULOPATHY 08/22/2006   Annotation: L arm 2002 Qualifier: Diagnosis of  By: Orvan Falconer MD, [name]     DEGENERATIVE DISC DISEASE 08/22/2006   Qualifier: Diagnosis of  By: Orvan Falconer MD, [name]     GENITAL HERPES 08/22/2006   Qualifier: Diagnosis of  By: Orvan Falconer MD, [name]     GERD 07/28/2006   Qualifier:  Diagnosis of  By: Orvan Falconer MD, [name]     GERD (gastroesophageal reflux disease)    HEPATITIS B, CHRONIC 07/28/2006   Qualifier: Diagnosis of  By: Orvan Falconer MD, [name]     HIV DISEASE 07/28/2006   Annotation: dx 2000 Qualifier: Diagnosis of  By: Orvan Falconer MD, [name]     HIV infection [facility])    Hyperlipidemia    Hypertension    HYPERTENSION NEC 07/07/2009   Qualifier: Diagnosis of  By: Orvan Falconer MD, [name]     Kidney stones    NEPHROLITHIASIS 08/22/2006   Annotation: x2, last 1992 Qualifier: Diagnosis of  By: Orvan Falconer MD, [name]     Obesity 09/12/2011   PSORIASIS 08/22/2006   Qualifier: Diagnosis of  By: Orvan Falconer MD, [name]      Past Surgical History:  Procedure Laterality Date   COLONOSCOPY     CYSTECTOMY  2000   cyst removed off buttock cheek    Prior to Admission medications   Medication Sig Start Date End Date Taking? Authorizing Provider  Darunavir-Cobicistat-Emtricitabine-Tenofovir Alafenamide [facility]) [id number] MG TABS Take 1 tablet by mouth daily with breakfast. 03/22/23  Yes Comer, Belia Heman, MD  ezetimibe (ZETIA) 10 MG tablet TAKE 1 TABLET BY MOUTH EVERY DAY 08/11/23  Yes Nahser, Deloris Ping, MD  fexofenadine (ALLEGRA) 180 MG tablet TAKE 1 TABLET BY MOUTH EVERY DAY 06/28/23  Yes Corwin Levins, MD  omeprazole (PRILOSEC OTC) 20 MG tablet Take 20 mg by mouth daily.   Yes [provider]  tiZANidine (ZANAFLEX) 4 MG tablet Take 4 mg by mouth at bedtime. 08/09/23  Yes [provider]  triamcinolone (NASACORT) 55 MCG/ACT AERO nasal inhaler Place 2 sprays into the nose daily. 10/07/22  Yes Corwin Levins, MD  valACYclovir (VALTREX) 500 MG tablet TAKE 1 TABLET BY MOUTH TWICE A DAY 08/11/23  Yes Corwin Levins, MD  diclofenac (VOLTAREN) 75 MG EC tablet Take 75 mg by mouth 2 (two) times daily. 09/06/23   [provider]  Multiple Vitamins-Minerals (MULTIVITAMIN WITH MINERALS) tablet Take 1 tablet by mouth daily.    [provider]  rosuvastatin (CRESTOR) 20 MG tablet  Take 1 tablet (20 mg total) by mouth daily. Please call office to schedule an appt for further refills. Thank you 07/14/23   Nahser, Deloris Ping, MD   Allergies  Allergen Reactions   Lisinopril Rash    Social History   Tobacco Use   Smoking status: Former    Current packs/day: 0.00    Types: Cigarettes    Quit date: 07/18/2012    Years since quitting: 11.1   Smokeless tobacco: Never   Tobacco comments:    quit over cruise over the holidays  Substance Use Topics   Alcohol use: Yes    Alcohol/week: 3.0 - 5.0 standard drinks of alcohol    Types: 3 - 5 Standard drinks or equivalent per week    Comment: occ    Family History  Problem Relation Age of Onset   Heart failure Mother    Colon polyps Sister    Diabetes Maternal Aunt    Heart disease Other    Hypertension Other    Arthritis Other    Cancer Other        colon cancer   Colon cancer Neg Hx    Esophageal cancer Neg Hx    Stomach cancer Neg Hx    Rectal cancer Neg Hx      Review of Systems  Positive ROS: As above  All other systems have been reviewed and were otherwise negative with the exception of those mentioned in the HPI and as above.  Objective: Vital signs in last 24 hours: Temp:  [98.4 F (36.9 C)-98.9 F (37.2 C)] 98.9 F (37.2 C) (02/28 1059) Pulse Rate:  [59] 59 (02/28 1059) Resp:  [20] 20 (02/28 1059) BP: (139-140)/(89) 140/89 (02/28 1059) SpO2:  [98 %-99 %] 98 % (02/28 1059) Weight:  [90.7 kg-92.2 kg] 90.7 kg (02/28 1059)  General Appearance: Alert, cooperative, no distress, appears stated age Head: Normocephalic, without obvious abnormality, atraumatic Eyes: PERRL, conjunctiva/corneas clear, EOM's intact    Neck: Supple, symmetrical, trachea midline Back: Symmetric, no curvature, ROM normal, no CVA tenderness Lungs:  respirations unlabored Heart: Regular rate and rhythm Abdomen: Soft, non-tender Extremities: Extremities normal, atraumatic, no cyanosis or edema Pulses: 2+ and symmetric all  extremities Skin: Skin color, texture, turgor normal, no rashes or lesions  NEUROLOGIC:   Mental status: Alert and oriented x4,  no aphasia, good attention span, fund of knowledge, and memory Motor Exam - grossly normal for 2 out of 5 left foot drop and about 3 out of 5 left hip flexor Sensory Exam - grossly normal Reflexes: 1+ Coordination - grossly normal Gait - grossly normal with a steppage gait on the left Balance - grossly normal Cranial Nerves: I: smell Not tested  II: visual acuity  OS: nl    OD: nl  II: visual fields Full to confrontation  II: pupils Equal, round, reactive to light  III,VII: ptosis None  III,IV,VI: extraocular muscles  Full ROM  V: mastication Normal  V: facial light touch sensation  Normal  V,VII: corneal reflex  Present  VII: facial muscle function - upper  Normal  VII: facial muscle function - lower Normal  VIII: hearing Not tested  IX: soft palate elevation  Normal  IX,X: gag reflex Present  XI: trapezius strength  5/5  XI: sternocleidomastoid strength 5/5  XI: neck flexion strength  5/5  XII: tongue strength  Normal    Data Review Lab Results  Component Value Date   WBC 8.4 09/21/2023   HGB 14.2 09/21/2023   HCT 42.3 09/21/2023   MCV 101.2 (H) 09/21/2023   PLT 194 09/21/2023   Lab Results  Component Value Date   NA 140 09/21/2023   K 4.2 09/21/2023   CL 101 09/21/2023   CO2 28 09/21/2023   BUN 18 09/21/2023   CREATININE 0.97 09/21/2023   GLUCOSE 90 09/21/2023   Lab Results  Component Value Date   INR 1.0 09/21/2023    Assessment/Plan:  Estimated body mass index is 27.89 kg/m as calculated from the following:   Height as of this encounter: 5\' 11"  (1.803 m).   Weight as of this encounter: 90.7 kg. Patient admitted for T11-12 laminectomy and possible discectomy, and left L3-4 and L4-5 hemilaminectomy. Patient has failed a reasonable attempt at conservative therapy.  I explained the condition and procedure to the patient and  answered any questions.  Patient wishes to proceed with procedure as planned. Understands risks/ benefits and typical outcomes of procedure.   Tia Alert 09/22/2023 12:32 PM

## 2023-09-22 NOTE — Anesthesia Preprocedure Evaluation (Signed)
 Anesthesia Evaluation  Patient identified by MRN, date of birth, ID band Patient awake    Reviewed: Allergy & Precautions, NPO status , Patient's Chart, lab work & pertinent test results  Airway Mallampati: II  TM Distance: >3 FB Neck ROM: Full    Dental no notable dental hx.    Pulmonary neg pulmonary ROS, former smoker   Pulmonary exam normal        Cardiovascular hypertension,  Rhythm:Regular Rate:Normal     Neuro/Psych   Anxiety     negative neurological ROS     GI/Hepatic Neg liver ROS,GERD  Medicated,,  Endo/Other  negative endocrine ROS    Renal/GU   negative genitourinary   Musculoskeletal  (+) Arthritis , Osteoarthritis,    Abdominal Normal abdominal exam  (+)   Peds  Hematology Lab Results      Component                Value               Date                      WBC                      8.4                 09/21/2023                HGB                      14.2                09/21/2023                HCT                      42.3                09/21/2023                MCV                      101.2 (H)           09/21/2023                PLT                      194                 09/21/2023             Lab Results      Component                Value               Date                      NA                       140                 09/21/2023                K  4.2                 09/21/2023                CO2                      28                  09/21/2023                GLUCOSE                  90                  09/21/2023                BUN                      18                  09/21/2023                CREATININE               0.97                09/21/2023                CALCIUM                  9.6                 09/21/2023                GFR                      81.54               04/18/2023                EGFR                     83                   03/15/2023                GFRNONAA                 >60                 09/21/2023              Anesthesia Other Findings   Reproductive/Obstetrics                             Anesthesia Physical Anesthesia Plan  ASA: 2  Anesthesia Plan: General   Post-op Pain Management: Celebrex PO (pre-op)*, Tylenol PO (pre-op)* and Gabapentin PO (pre-op)*   Induction: Intravenous  PONV Risk Score and Plan: 2 and Ondansetron, Dexamethasone, Midazolam and Treatment may vary due to age or medical condition  Airway Management Planned: Mask and Oral ETT  Additional Equipment: None  Intra-op Plan:   Post-operative Plan: Extubation in OR  Informed Consent: I have reviewed the patients History and Physical, chart, labs and discussed the procedure including the risks, benefits and alternatives for the proposed anesthesia with the patient or authorized representative who has indicated his/her understanding and acceptance.  Dental advisory given  Plan Discussed with: CRNA  Anesthesia Plan Comments:        Anesthesia Quick Evaluation

## 2023-09-22 NOTE — Transfer of Care (Signed)
 Immediate Anesthesia Transfer of Care Note  Patient: Anthony Steele  Procedure(s) Performed: Laminectomy and Foraminotomy - Thoracic Eleven- Thoracic Twelve - left,  - Lumbar Three-Lumbar Four - Lumbar Four-Lumbar Five - left (Left: Back)  Patient Location: PACU  Anesthesia Type:General  Level of Consciousness: awake, oriented, and patient cooperative  Airway & Oxygen Therapy: Patient Spontanous Breathing  Post-op Assessment: Report given to RN and Post -op Vital signs reviewed and stable  Post vital signs: Reviewed and stable  Last Vitals:  Vitals Value Taken Time  BP 121/78 09/22/23 1616  Temp 36.7 C 09/22/23 1615  Pulse 79 09/22/23 1622  Resp 18 09/22/23 1622  SpO2 95 % 09/22/23 1622  Vitals shown include unfiled device data.  Last Pain:  Vitals:   09/22/23 1615  TempSrc:   PainSc: 0-No pain         Complications: No notable events documented.

## 2023-09-22 NOTE — Anesthesia Procedure Notes (Signed)
 Procedure Name: Intubation Date/Time: 09/22/2023 1:09 PM  Performed by: Orlin Hilding, CRNAPre-anesthesia Checklist: Patient identified, Emergency Drugs available, Suction available, Patient being monitored and Timeout performed Patient Re-evaluated:Patient Re-evaluated prior to induction Oxygen Delivery Method: Circle system utilized Preoxygenation: Pre-oxygenation with 100% oxygen Induction Type: IV induction Ventilation: Mask ventilation without difficulty Laryngoscope Size: Mac and 4 Grade View: Grade I Tube type: Oral Tube size: 7.5 mm Number of attempts: 1 Placement Confirmation: ETT inserted through vocal cords under direct vision, positive ETCO2 and breath sounds checked- equal and bilateral Secured at: 23 cm Tube secured with: Tape Dental Injury: Teeth and Oropharynx as per pre-operative assessment

## 2023-09-22 NOTE — Op Note (Signed)
 09/22/2023  4:03 PM  PATIENT:  Anthony Steele  63 y.o. male  PRE-OPERATIVE DIAGNOSIS:  1.  Thoracic disc herniation T11-12 with thoracic spinal stenosis, 2.  Lumbar spondylosis with lumbar spinal stenosis L3-4 L4-5 with left lower extremity pain and foot drop  POST-OPERATIVE DIAGNOSIS:  same  PROCEDURE:  1.  T11-12 thoracic laminectomy, medial facetectomy and foraminotomies with microdiscectomy utilizing microscopic dissection, 2.  Left L3-4 L4-5 hemilaminectomy, medial facetectomy and foraminotomies with sublaminar decompression  SURGEON:  Marikay Alar, MD  ASSISTANTS: Verlin Dike, FNP  ANESTHESIA:   General  EBL: 50 ml  Total I/O In: 1750 [I.V.:1500; IV Piggyback:250] Out: 150 [Blood:150]  BLOOD ADMINISTERED: none  DRAINS: None  SPECIMEN:  none  INDICATION FOR PROCEDURE: This patient presented with severe back pain with left leg pain and subacute foot drop. Imaging showed large thoracic disc herniation T11-12 with stenosis and severe stenosis at L3-4 and L4-5. The patient tried conservative measures without relief. Pain was debilitating. Recommended T11-12 laminectomy with possible microdiscectomy, and a left L3-4 and L4-5 hemilaminectomy and sublaminar decompression. Patient understood the risks, benefits, and alternatives and potential outcomes and wished to proceed.  PROCEDURE DETAILS: The patient was taken to the operating room and after induction of adequate generalized endotracheal anesthesia, the patient was rolled into the prone position on  chest rolls and Steele pressure points were padded. The thoracic and lumbar region was cleaned and then prepped with DuraPrep and draped in the usual sterile fashion.   We started with the thoracic area.  The incisions were marked with x-ray.a dorsal midline incision was made and carried down to the thoracic fascia. The fascia was opened and the paraspinous musculature was taken down in a subperiosteal fashion to expose T11-12  bilaterally. Intraoperative x-ray confirmed my level and Steele in the room agreed including Dr. Wynetta Emery who had stopped in, and then I removed the spinous process and used a combination of the high-speed drill and the Kerrison punches to perform a laminectomy, medial facetectomy, and foraminotomy at T11-12 bilaterally.  I drilled the medial facet on the left and into the goal in order to get lateral to the dura T11-12 on the left.  The underlying yellow ligament was opened and removed in a piecemeal fashion to expose the underlying dura and exiting nerve root. I undercut the lateral recess and dissected down until I was medial to and distal to the pedicle.  I was able to coagulate the epidural venous vasculature and bring in the operating microscope for microscopic dissection.  We identified the disc space and coagulated it.  We passed a nerve hook and a ball probe under the dura very gently.  I was not able to remove a significant disc herniation.  Therefore I incised the annulus and performed a gentle discectomy.  I was able to push down disc into the disc space until the annulus was sagging.  We felt no more compression in the midline utilizing the ball probe.  We were very careful not to put any pressure on the lateral part of the spinal cord.  We then irrigated with saline solution.  We checked our final x-ray 1 more time with a nerve hook over the disc base.  Steele in the room agreed it was T11-12.  Then lined the dura with Gelfoam.  We closed the muscle and the fascia with 0 Vicryl.  We closed the subcutaneous tissue with 2-0 Vicryl in the subcuticular tissue with 3-0 Vicryl.  Then turned our attention to the  lumbar region.  This would be done through a separate incision obviously.      5 cc of local anesthesia was injected and then a dorsal midline incision was made and carried down to the lumbo sacral fascia. The fascia was opened and the paraspinous musculature was taken down in a subperiosteal fashion to  expose L3-4 and L4-5 on the left. Intraoperative x-ray confirmed my level, and then I used a combination of the high-speed drill and the Kerrison punches to perform a hemilaminectomy, medial facetectomy, and foraminotomy at L3-4 and L4-5 on the left. The underlying yellow ligament was opened and removed in a piecemeal fashion to expose the underlying dura and exiting nerve root. I undercut the lateral recess and dissected down until I was medial to and distal to the pedicle. The nerve root was well decompressed. We then gently retracted the nerve root medially with a retractor, coagulated the epidural venous vasculature, and incised the disc space. A performed a thorough intradiscal discectomy with pituitary rongeurs and curettes, until I had a nice decompression of the nerve root and the midline. I then palpated with a coronary dilator along the nerve root and into the foramen to assure adequate decompression. I felt no more compression of the nerve root.  I drilled up under the spinous process and performed a sublaminar decompression especially at L3-4.  Was worse central stenosis at that level.  I irrigated with saline solution containing bacitracin. Achieved hemostasis with bipolar cautery, lined the dura with Gelfoam, and then closed the fascia with 0 Vicryl. I closed the subcutaneous tissues with 2-0 Vicryl and the subcuticular tissues with 3-0 Vicryl. The skin was then closed with benzoin and Steri-Strips. The drapes were removed, a sterile dressing was applied.  My nurse practitioner was involved in the exposure, safe retraction of the neural elements, the disc work and the closure. the patient was awakened from general anesthesia and transferred to the recovery room in stable condition. At the end of the procedure Steele sponge, needle and instrument counts were correct.    PLAN OF CARE: Admit for overnight observation  PATIENT DISPOSITION:  PACU - hemodynamically stable.   Delay start of  Pharmacological VTE agent (>24hrs) due to surgical blood loss or risk of bleeding:  yes

## 2023-09-22 NOTE — Plan of Care (Signed)

## 2023-09-23 DIAGNOSIS — Z79899 Other long term (current) drug therapy: Secondary | ICD-10-CM | POA: Diagnosis not present

## 2023-09-23 DIAGNOSIS — M47896 Other spondylosis, lumbar region: Secondary | ICD-10-CM | POA: Diagnosis not present

## 2023-09-23 DIAGNOSIS — B20 Human immunodeficiency virus [HIV] disease: Secondary | ICD-10-CM | POA: Diagnosis not present

## 2023-09-23 DIAGNOSIS — M5184 Other intervertebral disc disorders, thoracic region: Secondary | ICD-10-CM | POA: Diagnosis not present

## 2023-09-23 DIAGNOSIS — I10 Essential (primary) hypertension: Secondary | ICD-10-CM | POA: Diagnosis not present

## 2023-09-23 DIAGNOSIS — M48061 Spinal stenosis, lumbar region without neurogenic claudication: Secondary | ICD-10-CM | POA: Diagnosis not present

## 2023-09-23 DIAGNOSIS — Z87891 Personal history of nicotine dependence: Secondary | ICD-10-CM | POA: Diagnosis not present

## 2023-09-23 MED ORDER — OXYCODONE-ACETAMINOPHEN 5-325 MG PO TABS: 1.0000 | ORAL_TABLET | ORAL | 0 refills | Status: DC | PRN

## 2023-09-23 MED ORDER — METHOCARBAMOL 750 MG PO TABS: 750.0000 mg | ORAL_TABLET | Freq: Four times a day (QID) | ORAL | 0 refills | Status: DC

## 2023-09-23 NOTE — Discharge Summary (Signed)
 Physician Discharge Summary  Patient ID: Anthony Steele MRN: [id number] DOB/AGE: 1961-05-09 63 y.o.  Admit date: 09/22/2023 Discharge date: 09/23/2023  Admission Diagnoses: Thoracic disc herniation T11-12 with thoracic spinal stenosis, 2.  Lumbar spondylosis with lumbar spinal stenosis L3-4 L4-5 with left lower extremity pain and foot drop     Discharge Diagnoses: same   Discharged Condition: good  Hospital Course: The patient was admitted on 09/22/2023 and taken to the operating room where the patient underwent T11-T12 laminectomy and left laminectomy L3-4, L4-5. The patient tolerated the procedure well and was taken to the recovery room and then to the floor in stable condition. The hospital course was routine. There were no complications. The wound remained clean dry and intact. Pt had appropriate back soreness. No complaints of leg pain or new N/T/W. The patient remained afebrile with stable vital signs, and tolerated a regular diet. The patient continued to increase activities, and pain was well controlled with oral pain medications.   Consults: None  Significant Diagnostic Studies:  Results for orders placed or performed during the hospital encounter of 09/21/23  Protime-INR   Collection Time: 09/21/23  2:30 PM  Result Value Ref Range   Prothrombin Time 13.3 11.4 - 15.2 seconds   INR 1.0 0.8 - 1.2  Basic metabolic panel per protocol   Collection Time: 09/21/23  2:30 PM  Result Value Ref Range   Sodium 140 135 - 145 mmol/L   Potassium 4.2 3.5 - 5.1 mmol/L   Chloride 101 98 - 111 mmol/L   CO2 28 22 - 32 mmol/L   Glucose, Bld 90 70 - 99 mg/dL   BUN 18 8 - 23 mg/dL   Creatinine, Ser 4.09 0.61 - 1.24 mg/dL   Calcium 9.6 8.9 - 81.1 mg/dL   GFR, Estimated >91 >47 mL/min   Anion gap 11 5 - 15  CBC per protocol   Collection Time: 09/21/23  2:30 PM  Result Value Ref Range   WBC 8.4 4.0 - 10.5 K/uL   RBC 4.18 (L) 4.22 - 5.81 MIL/uL   Hemoglobin 14.2 13.0 - 17.0 g/dL   HCT 82.9  56.2 - 13.0 %   MCV 101.2 (H) 80.0 - 100.0 fL   MCH 34.0 26.0 - 34.0 pg   MCHC 33.6 30.0 - 36.0 g/dL   RDW 86.5 78.4 - 69.6 %   Platelets 194 150 - 400 K/uL   nRBC 0.0 0.0 - 0.2 %  Surgical pcr screen   Collection Time: 09/21/23  3:00 PM   Specimen: Nasal Mucosa; Nasal Swab  Result Value Ref Range   MRSA, PCR NEGATIVE NEGATIVE   Staphylococcus aureus POSITIVE (A) NEGATIVE    DG Lumbar Spine 2-3 Views Result Date: 09/22/2023 CLINICAL DATA:  Laminectomy and foraminotomy. EXAM: LUMBAR SPINE - 2-3 VIEW COMPARISON:  Lumbar spine radiograph dated 09/18/2023. FINDINGS: Several lateral views of the lumbar spine provided. There appears to be 5 lumbar type vertebra. A surgical probe with tip at L4-L5 noted on the last image. No acute fracture or subluxation. The bones are osteopenic. Multilevel degenerative changes. The soft tissues are unremarkable. IMPRESSION: Surgical probe with tip at L4-L5 on the last image. Electronically Signed   By: Elgie Collard M.D.   On: 09/22/2023 19:18   MR THORACIC SPINE WO CONTRAST Result Date: 09/18/2023 CLINICAL DATA:  Radiculopathy of the thoracic region. EXAM: MRI THORACIC SPINE WITHOUT CONTRAST TECHNIQUE: Multiplanar, multisequence MR imaging of the thoracic spine was performed. No intravenous contrast was administered. COMPARISON:  None Available.  FINDINGS: Alignment: Normal thoracic kyphosis is present. No significant listhesis is present. Vertebrae: Marrow signal and vertebral body heights are normal. Schmorl's nodes are present T11-12 and T12-L1. Cord: Central T2 hyperintensity extends 6 cm from T10 through T12 surrounding no large disc protrusion at the T11-12 disc space. The cord is compressed posteriorly within the canal to 6 mm. Normal cord signal and morphology is present above this level. Paraspinal and other soft tissues: Paraspinous soft tissues are within normal limits. Visualized lung fields and upper abdomen are unremarkable. Disc levels: A large disc  protrusion and ventral hematoma is present at the T11-12 level. This results in severe central canal stenosis. The foramina are patent bilaterally. Shallow central disc protrusions are present at T2-3, T6-7, T7-8, T9-10 and T10-11 without significant central canal stenosis. Facet spurring contributes to left foraminal stenosis at T6-7, T7-8, T8-9, T9-10 and T10-11. Facet hypertrophy contributes to right foraminal stenosis at T2-3, T3-4, T7-8, T8-9 and T9-10. IMPRESSION: 1. Large disc protrusion and ventral hematoma at the T11-12 level results in severe central canal stenosis. 2. Central T2 hyperintensity extends 6 cm from T10 through T12 surrounding the large disc protrusion at the T11-12 disc space. This likely represents compressive myelopathy. 3. Shallow central disc protrusions at T2-3, T6-7, T7-8, T9-10 and T10-11 without significant central canal stenosis at these levels. 4. Facet spurring contributes to left foraminal stenosis at T6-7, T7-8, T8-9, T9-10 and T10-11. 5. Facet hypertrophy contributes to right foraminal stenosis at T2-3, T3-4, T7-8, T8-9 and T9-10. Electronically Signed   By: Marin Roberts M.D.   On: 09/18/2023 22:59    Antibiotics:  Anti-infectives (From admission, onward)    Start     Dose/Rate Route Frequency Ordered Stop   09/23/23 0800  Darunavir-Cobicistat-Emtricitabine-Tenofovir Alafenamide (SYMTUZA) 800-150-200-10 MG TABS 1 tablet        1 tablet Oral Daily with breakfast 09/22/23 1647     09/22/23 2200  valACYclovir (VALTREX) tablet 500 mg        500 mg Oral 2 times daily 09/22/23 1647     09/22/23 2200  ceFAZolin (ANCEF) IVPB 2g/100 mL premix        2 g 200 mL/hr over 30 Minutes Intravenous Every 8 hours 09/22/23 1647 09/23/23 0540   09/22/23 1100  ceFAZolin (ANCEF) IVPB 2g/100 mL premix        2 g 200 mL/hr over 30 Minutes Intravenous On call to O.R. 09/22/23 1053 09/22/23 1315       Discharge Exam: Blood pressure 137/84, pulse 71, temperature 98.7 F (37.1  C), temperature source Oral, resp. rate 20, height 5\' 11"  (1.803 m), weight 90.7 kg, SpO2 98%. Neurologic: Grossly normal Ambulating and voiding well incision cdi, left foot drop has improved somewhat, 3/5  Discharge Medications:   Allergies as of 09/23/2023       Reactions   Lisinopril Rash        Medication List     TAKE these medications    chlorhexidine 4 % external liquid Commonly known as: HIBICLENS Apply 15 mLs (1 Application total) topically as directed for 30 doses. Use as directed daily for 5 days every other week for 6 weeks.   diclofenac 75 MG EC tablet Commonly known as: VOLTAREN Take 75 mg by mouth 2 (two) times daily.   ezetimibe 10 MG tablet Commonly known as: ZETIA TAKE 1 TABLET BY MOUTH EVERY DAY   fexofenadine 180 MG tablet Commonly known as: ALLEGRA TAKE 1 TABLET BY MOUTH EVERY DAY   methocarbamol 750  MG tablet Commonly known as: Robaxin-750 Take 1 tablet (750 mg total) by mouth 4 (four) times daily.   multivitamin with minerals tablet Take 1 tablet by mouth daily.   mupirocin ointment 2 % Commonly known as: BACTROBAN Place 1 Application into the nose 2 (two) times daily for 60 doses. Use as directed 2 times daily for 5 days every other week for 6 weeks.   omeprazole 20 MG tablet Commonly known as: PRILOSEC OTC Take 20 mg by mouth daily.   oxyCODONE-acetaminophen 5-325 MG tablet Commonly known as: Percocet Take 1 tablet by mouth every 4 (four) hours as needed for severe pain (pain score 7-10).   rosuvastatin 20 MG tablet Commonly known as: CRESTOR Take 1 tablet (20 mg total) by mouth daily. Please call office to schedule an appt for further refills. Thank you   Symtuza 800-150-200-10 MG Tabs Generic drug: Darunavir-Cobicistat-Emtricitabine-Tenofovir Alafenamide Take 1 tablet by mouth daily with breakfast.   tiZANidine 4 MG tablet Commonly known as: ZANAFLEX Take 4 mg by mouth at bedtime.   triamcinolone 55 MCG/ACT Aero nasal  inhaler Commonly known as: NASACORT Place 2 sprays into the nose daily.   valACYclovir 500 MG tablet Commonly known as: VALTREX TAKE 1 TABLET BY MOUTH TWICE A DAY        Disposition: home   Final Dx: T11-T12 laminectomy, left L3-4 and L4-5 laminectomy  Discharge Instructions      Remove dressing in 72 hours   Complete by: As directed    Call MD for:  difficulty breathing, headache or visual disturbances   Complete by: As directed    Call MD for:  hives   Complete by: As directed    Call MD for:  persistant nausea and vomiting   Complete by: As directed    Call MD for:  redness, tenderness, or signs of infection (pain, swelling, redness, odor or green/yellow discharge around incision site)   Complete by: As directed    Call MD for:  severe uncontrolled pain   Complete by: As directed    Call MD for:  temperature >100.4   Complete by: As directed    Diet - low sodium heart healthy   Complete by: As directed    Driving Restrictions   Complete by: As directed    No driving for 2 weeks, no riding in the car for 1 week   Increase activity slowly   Complete by: As directed    Lifting restrictions   Complete by: As directed    No lifting more than 8 lbs          Signed: Tiana Loft Breindel Collier 09/23/2023, 8:04 AM

## 2023-09-23 NOTE — Progress Notes (Signed)
 Pt discharged to home with friends via car. IV removed. Discharge instructions reviewed with patient. Pt voices understanding of discharge instructions with no further questions at this time.

## 2023-09-23 NOTE — Anesthesia Postprocedure Evaluation (Signed)
 Anesthesia Post Note  Patient: Anthony Steele  Procedure(s) Performed: Laminectomy and Foraminotomy - Thoracic Eleven- Thoracic Twelve - left,  - Lumbar Three-Lumbar Four - Lumbar Four-Lumbar Five - left (Left: Back)     Patient location during evaluation: PACU Anesthesia Type: General Level of consciousness: awake and alert Pain management: pain level controlled Vital Signs Assessment: post-procedure vital signs reviewed and stable Respiratory status: spontaneous breathing, nonlabored ventilation, respiratory function stable and patient connected to nasal cannula oxygen Cardiovascular status: blood pressure returned to baseline and stable Postop Assessment: no apparent nausea or vomiting Anesthetic complications: no   No notable events documented.  Last Vitals:  Vitals:   09/23/23 0500 09/23/23 0724  BP: 109/70 137/84  Pulse: 60 71  Resp: 16 20  Temp: 36.8 C 37.1 C  SpO2: 98% 98%    Last Pain:  Vitals:   09/23/23 0724  TempSrc: Oral  PainSc:                  Nelle Don Khoury Siemon

## 2023-09-23 NOTE — Discharge Instructions (Signed)

## 2023-09-23 NOTE — Evaluation (Signed)
 Physical Therapy Evaluation Patient Details Name: Anthony Steele MRN: [id number] DOB: Nov 28, 1960 Today's Date: 09/23/2023  History of Present Illness  Pt is a 63 y.o. male s/p T11-12 laminectomy, medial facetectomy, and foraminotomies with microdiscectomy. PMH significant for anxiety, cervical radiculopathy, DDD, GERD, HEP B, HIV, HLD, HTN, nephrolithiasis, psoriasis.  Clinical Impression  PTA pt was independent with mobility (however had decreased L foot dorsiflexion), ADLs and iADLs. Pt will be going to friends home with 1 step entry and one level. Pt is currently limited in safe mobility by back pain and decreased L foot dorsiflexion. Pt is supervision for transfers and ambulation. Encouraged pt to use RW and practice normal L foot heel strike toe off pattern. Pt able to recall 3/3 back precautions at end of session. Pt has no further PT needs. PT signing off.       If plan is discharge home, recommend the following: A little help with walking and/or transfers;A little help with bathing/dressing/bathroom;Assistance with cooking/housework;Assist for transportation;Help with stairs or ramp for entrance   Can travel by private vehicle    Yes    Equipment Recommendations None recommended by PT (has RW at home)     Functional Status Assessment Patient has had a recent decline in their functional status and demonstrates the ability to make significant improvements in function in a reasonable and predictable amount of time.     Precautions / Restrictions Precautions Precautions: Back Precaution Booklet Issued: Yes (comment) Recall of Precautions/Restrictions: Intact Precaution/Restrictions Comments: all precautions reviewed for comfort within the context of ADL Required Braces or Orthoses:  (no brace needed orders) Restrictions Weight Bearing Restrictions Per Provider Order: No      Mobility  Bed Mobility Overal bed mobility: Needs Assistance Bed Mobility: Rolling, Sidelying to  Sit Rolling: Supervision Sidelying to sit: Supervision       General bed mobility comments: one cue for initiation of log roll after education    Transfers Overall transfer level: Needs assistance Equipment used: None Transfers: Sit to/from Stand Sit to Stand: Supervision           General transfer comment: for safety    Ambulation/Gait Ambulation/Gait assistance: Supervision Gait Distance (Feet): 150 Feet Assistive device: None Gait Pattern/deviations: Step-through pattern, Decreased dorsiflexion - left Gait velocity: WFL Gait velocity interpretation: 1.31 - 2.62 ft/sec, indicative of limited community ambulator   General Gait Details: supervision for safety, pt with increase L hip and knee flexion to advance L LE although now pt has more active Dorsiflexion, vc for heel strike toe off, encouraged pt to use RW at home for UE support while practicing for safety        Balance Overall balance assessment: Needs assistance Sitting-balance support: No upper extremity supported, Feet supported Sitting balance-Leahy Scale: Good     Standing balance support: No upper extremity supported Standing balance-Leahy Scale: Fair Standing balance comment: CGA for safety with short distance ambulation. Fair-good with static standing                             Pertinent Vitals/Pain Pain Assessment Pain Assessment: Faces Faces Pain Scale: Hurts little more Pain Location: operative site, LLE numbness Pain Descriptors / Indicators: Operative site guarding, Numbness Pain Intervention(s): Limited activity within patient's tolerance    Home Living Family/patient expects to be discharged to:: Private residence Living Arrangements: Alone Available Help at Discharge: Friend(s) (plans to stay with friend for several weeks) Type of Home: House Home Access:  Level entry       Home Layout: One level (one step within the home) Home Equipment: Rolling Walker (2 wheels);Grab  bars - tub/shower;Hand held shower head (adjustable bed, built in bidet with dryer at friend's home where he will stay) Additional Comments: above for friends home who he plans to stay with.    Prior Function Prior Level of Function : Independent/Modified Independent;Working/employed             Mobility Comments: no AD ADLs Comments: independent with ADL and IADL PTA     Extremity/Trunk Assessment   Upper Extremity Assessment Upper Extremity Assessment: Defer to OT evaluation    Lower Extremity Assessment Lower Extremity Assessment: LLE deficits/detail LLE Deficits / Details: L dorsiflexion limited, able to initiate movement but not through full range, strength 2/5 LLE Sensation: decreased proprioception LLE Coordination: decreased fine motor    Cervical / Trunk Assessment Cervical / Trunk Assessment: Back Surgery  Communication   Communication Communication: No apparent difficulties    Cognition Arousal: Alert Behavior During Therapy: WFL for tasks assessed/performed                             Following commands: Intact       Cueing Cueing Techniques: Verbal cues     General Comments General comments (skin integrity, edema, etc.): Pt excited about increased dorsiflexion already present     Assessment/Plan    PT Assessment Patient does not need any further PT services         PT Goals (Current goals can be found in the Care Plan section)  Acute Rehab PT Goals PT Goal Formulation: All assessment and education complete, DC therapy Potential to Achieve Goals: Good     AM-PAC PT "6 Clicks" Mobility  Outcome Measure Help needed turning from your back to your side while in a flat bed without using bedrails?: None Help needed moving from lying on your back to sitting on the side of a flat bed without using bedrails?: None Help needed moving to and from a bed to a chair (including a wheelchair)?: None Help needed standing up from a chair using  your arms (e.g., wheelchair or bedside chair)?: None Help needed to walk in hospital room?: None Help needed climbing 3-5 steps with a railing? : A Little 6 Click Score: 23    End of Session   Activity Tolerance: Patient tolerated treatment well Patient left: in bed;with family/visitor present (sitting on EOB) Nurse Communication: Mobility status PT Visit Diagnosis: Other abnormalities of gait and mobility (R26.89)    Time: 1610-9604 PT Time Calculation (min) (ACUTE ONLY): 27 min   Charges:   PT Evaluation $PT Eval Low Complexity: 1 Low PT Treatments $Gait Training: 8-22 mins PT General Charges $$ ACUTE PT VISIT: 1 Visit         Igor Bishop B. Beverely Risen PT, DPT Acute Rehabilitation Services Please use secure chat or  Call Office [phone]   Elon Alas Fleet 09/23/2023, 10:12 AM

## 2023-09-23 NOTE — Evaluation (Signed)
 Occupational Therapy Evaluation Patient Details Name: Anthony Steele MRN: [id number] DOB: 1960-12-19 Today's Date: 09/23/2023   History of Present Illness   Pt is a 63 y.o. male s/p T11-12 laminectomy, medial facetectomy, and foraminotomies with microdiscectomy. PMH significant for anxiety, cervical radiculopathy, DDD, GERD, HEP B, HIV, HLD, HTN, nephrolithiasis, psoriasis.     Clinical Impressions PTA, pt lived alone and was mod I. Pt plans to go stay with friend with home set-up below at dc. Upon eval, pt performing UB ADL with set-up and LB ADL with up to CGA. Pt educated and demonstrating use of compensatory techniques for bed mobility, LB ADL, grooming, toileting, and shower transfers within precautions. Pt continues with L foot drop and poor balance, thus recommend PT consult. All education provided and questions answered. Recommending discharge home with friend with no further OT at this time. OT to sign off. Please re-consult if change in status.        If plan is discharge home, recommend the following:   A little help with walking and/or transfers;A little help with bathing/dressing/bathroom;Assistance with cooking/housework;Assist for transportation;Help with stairs or ramp for entrance     Functional Status Assessment   Patient has had a recent decline in their functional status and demonstrates the ability to make significant improvements in function in a reasonable and predictable amount of time.     Equipment Recommendations   None recommended by OT     Recommendations for Other Services   PT consult     Precautions/Restrictions   Precautions Precautions: Back Precaution Booklet Issued: Yes (comment) Recall of Precautions/Restrictions: Intact Precaution/Restrictions Comments: all precautions reviewed for comfort within the context of ADL Required Braces or Orthoses:  (no brace needed orders) Restrictions Weight Bearing Restrictions Per Provider Order:  No     Mobility Bed Mobility Overal bed mobility: Needs Assistance Bed Mobility: Rolling, Sidelying to Sit Rolling: Supervision Sidelying to sit: Supervision       General bed mobility comments: one cue for initiation of log roll after education    Transfers Overall transfer level: Needs assistance Equipment used: None Transfers: Sit to/from Stand Sit to Stand: Supervision           General transfer comment: for safety      Balance Overall balance assessment: Needs assistance Sitting-balance support: No upper extremity supported, Feet supported Sitting balance-Leahy Scale: Good     Standing balance support: No upper extremity supported Standing balance-Leahy Scale: Fair Standing balance comment: CGA for safety with short distance ambulation. Fair-good with static standing                           ADL either performed or assessed with clinical judgement   ADL Overall ADL's : Needs assistance/impaired Eating/Feeding: Independent   Grooming: Supervision/safety;Standing   Upper Body Bathing: Set up;Sitting   Lower Body Bathing: Supervison/ safety;Sit to/from stand   Upper Body Dressing : Set up;Sitting   Lower Body Dressing: Supervision/safety;Sit to/from stand   Toilet Transfer: Contact guard assist;Ambulation     Toileting - Clothing Manipulation Details (indicate cue type and reason): reviewed compensatory techniques. Pt friend has bidet with dryer     Functional mobility during ADLs: Contact guard assist       Vision Baseline Vision/History: 1 Wears glasses Ability to See in Adequate Light: 0 Adequate Patient Visual Report: No change from baseline Vision Assessment?: No apparent visual deficits     Perception Perception: Not tested  Praxis Praxis: Not tested       Pertinent Vitals/Pain Pain Assessment Pain Assessment: Faces Faces Pain Scale: Hurts little more Pain Location: operative site, LLE numbness Pain Descriptors  / Indicators: Operative site guarding, Numbness Pain Intervention(s): Limited activity within patient's tolerance, Monitored during session     Extremity/Trunk Assessment Upper Extremity Assessment Upper Extremity Assessment: Overall WFL for tasks assessed   Lower Extremity Assessment Lower Extremity Assessment: Defer to PT evaluation   Cervical / Trunk Assessment Cervical / Trunk Assessment: Back Surgery   Communication Communication Communication: No apparent difficulties   Cognition Arousal: Alert Behavior During Therapy: WFL for tasks assessed/performed Cognition: No apparent impairments                               Following commands: Intact       Cueing  General Comments   Cueing Techniques: Verbal cues      Exercises     Shoulder Instructions      Home Living Family/patient expects to be discharged to:: Private residence Living Arrangements: Alone Available Help at Discharge: Friend(s) (plans to stay with friend for several weeks) Type of Home: House Home Access: Level entry     Home Layout: One level (one step within the home)     Bathroom Shower/Tub: Arts development officer Toilet: Handicapped height     Home Equipment: Agricultural consultant (2 wheels);Grab bars - tub/shower;Hand held shower head (adjustable bed, built in bidet with dryer at friend's home where he will stay)   Additional Comments: above for friends home who he plans to stay with.      Prior Functioning/Environment Prior Level of Function : Independent/Modified Independent;Working/employed             Mobility Comments: no AD ADLs Comments: independent with ADL and IADL PTA    OT Problem List: Decreased strength;Decreased activity tolerance;Impaired balance (sitting and/or standing);Decreased knowledge of precautions;Decreased knowledge of use of DME or AE   OT Treatment/Interventions:        OT Goals(Current goals can be found in the care plan section)    Acute Rehab OT Goals Patient Stated Goal: get better OT Goal Formulation: With patient Time For Goal Achievement: 10/07/23 Potential to Achieve Goals: Good   OT Frequency:       Co-evaluation              AM-PAC OT "6 Clicks" Daily Activity     Outcome Measure Help from another person eating meals?: None Help from another person taking care of personal grooming?: A Little Help from another person toileting, which includes using toliet, bedpan, or urinal?: A Little Help from another person bathing (including washing, rinsing, drying)?: A Little Help from another person to put on and taking off regular upper body clothing?: A Little Help from another person to put on and taking off regular lower body clothing?: A Little 6 Click Score: 19   End of Session Equipment Utilized During Treatment: Gait belt Nurse Communication: Mobility status  Activity Tolerance: Patient tolerated treatment well Patient left: in bed;with call bell/phone within reach;with family/visitor present  OT Visit Diagnosis: Unsteadiness on feet (R26.81);Muscle weakness (generalized) (M62.81);Other abnormalities of gait and mobility (R26.89)                Time: 1610-9604 OT Time Calculation (min): 22 min Charges:  OT General Charges $OT Visit: 1 Visit OT Evaluation $OT Eval Low Complexity: 1 Low  Tyler Deis, OTR/L [facility] Acute Rehabilitation Office: [phone]   Myrla Halsted 09/23/2023, 9:22 AM

## 2023-09-24 ENCOUNTER — Encounter (HOSPITAL_COMMUNITY): Payer: Self-pay | Admitting: Neurological Surgery

## 2023-09-24 ENCOUNTER — Other Ambulatory Visit: Payer: Self-pay | Admitting: Cardiovascular Disease

## 2023-09-24 DIAGNOSIS — Z8249 Family history of ischemic heart disease and other diseases of the circulatory system: Secondary | ICD-10-CM

## 2023-09-24 DIAGNOSIS — E782 Mixed hyperlipidemia: Secondary | ICD-10-CM

## 2023-09-24 DIAGNOSIS — I2510 Atherosclerotic heart disease of native coronary artery without angina pectoris: Secondary | ICD-10-CM

## 2023-09-24 DIAGNOSIS — Z79899 Other long term (current) drug therapy: Secondary | ICD-10-CM

## 2023-09-26 ENCOUNTER — Ambulatory Visit: Payer: 59 | Admitting: Cardiovascular Disease

## 2023-09-26 MED ORDER — ROSUVASTATIN CALCIUM 20 MG PO TABS
20.0000 mg | ORAL_TABLET | Freq: Every day | ORAL | 1 refills | Status: DC
Start: 2023-09-26 — End: 2024-05-08

## 2023-09-28 ENCOUNTER — Other Ambulatory Visit: Payer: Self-pay

## 2023-10-06 ENCOUNTER — Other Ambulatory Visit: Payer: Self-pay | Admitting: Cardiovascular Disease

## 2023-10-06 DIAGNOSIS — Z79899 Other long term (current) drug therapy: Secondary | ICD-10-CM

## 2023-10-06 DIAGNOSIS — E782 Mixed hyperlipidemia: Secondary | ICD-10-CM

## 2023-10-13 DIAGNOSIS — M21372 Foot drop, left foot: Secondary | ICD-10-CM | POA: Diagnosis not present

## 2023-10-17 ENCOUNTER — Other Ambulatory Visit: Payer: Self-pay | Admitting: Pharmacy Technician

## 2023-10-17 ENCOUNTER — Other Ambulatory Visit: Payer: Self-pay

## 2023-10-17 DIAGNOSIS — M21372 Foot drop, left foot: Secondary | ICD-10-CM | POA: Diagnosis not present

## 2023-10-17 NOTE — Progress Notes (Signed)
 Specialty Pharmacy Refill Coordination Note  Anthony Steele is a 63 y.o. male contacted today regarding refills of specialty medication(s) Darun-Cobic-Emtricit-TenofAF [name])   Patient requested (Patient-Rptd) Pickup at [facility] at [facility] date: (Patient-Rptd) 10/27/23   Medication will be filled on 10/26/23.

## 2023-10-19 DIAGNOSIS — M21372 Foot drop, left foot: Secondary | ICD-10-CM | POA: Diagnosis not present

## 2023-10-24 DIAGNOSIS — M21372 Foot drop, left foot: Secondary | ICD-10-CM | POA: Diagnosis not present

## 2023-10-26 ENCOUNTER — Other Ambulatory Visit: Payer: Self-pay

## 2023-10-26 DIAGNOSIS — M21372 Foot drop, left foot: Secondary | ICD-10-CM | POA: Diagnosis not present

## 2023-10-31 DIAGNOSIS — M21372 Foot drop, left foot: Secondary | ICD-10-CM | POA: Diagnosis not present

## 2023-11-02 DIAGNOSIS — M21372 Foot drop, left foot: Secondary | ICD-10-CM | POA: Diagnosis not present

## 2023-11-07 DIAGNOSIS — M21372 Foot drop, left foot: Secondary | ICD-10-CM | POA: Diagnosis not present

## 2023-11-09 DIAGNOSIS — M21372 Foot drop, left foot: Secondary | ICD-10-CM | POA: Diagnosis not present

## 2023-11-14 ENCOUNTER — Other Ambulatory Visit (HOSPITAL_COMMUNITY): Payer: Self-pay

## 2023-11-15 ENCOUNTER — Other Ambulatory Visit: Payer: Self-pay

## 2023-11-16 ENCOUNTER — Other Ambulatory Visit: Payer: Self-pay | Admitting: Pharmacy Technician

## 2023-11-16 ENCOUNTER — Other Ambulatory Visit: Payer: Self-pay

## 2023-11-16 DIAGNOSIS — M21372 Foot drop, left foot: Secondary | ICD-10-CM | POA: Diagnosis not present

## 2023-11-16 NOTE — Progress Notes (Signed)
 Specialty Pharmacy Refill Coordination Note  Anthony Steele is a 63 y.o. male contacted today regarding refills of specialty medication(s) Darun-Cobic-Emtricit-TenofAF (Symtuza )   Patient requested (Patient-Rptd) Pickup at [facility] at [facility] date: (Patient-Rptd) 11/22/23   Medication will be filled on 11/21/23.

## 2023-11-21 ENCOUNTER — Other Ambulatory Visit: Payer: Self-pay

## 2023-11-21 DIAGNOSIS — M21372 Foot drop, left foot: Secondary | ICD-10-CM | POA: Diagnosis not present

## 2023-11-23 DIAGNOSIS — M21372 Foot drop, left foot: Secondary | ICD-10-CM | POA: Diagnosis not present

## 2023-11-27 ENCOUNTER — Other Ambulatory Visit: Payer: Self-pay

## 2023-11-27 ENCOUNTER — Other Ambulatory Visit: Payer: Self-pay | Admitting: Internal Medicine

## 2023-12-03 ENCOUNTER — Encounter: Payer: Self-pay | Admitting: Cardiovascular Disease

## 2023-12-03 NOTE — Progress Notes (Unsigned)
 Cardiology Office Note:    Date:  12/04/2023   ID:  Maryland Snow, DOB 14-Mar-1961, MRN [id number]  PCP:  Roslyn Coombe, MD   [facility] Providers Cardiologist: Haylee Mcanany    Referring MD: Roslyn Coombe, MD   No chief complaint on file.   History of Present Illness:    Anthony Steele is a 63 y.o. male with a hx of HTN, HLD,   Recent coronary calcium  score revealed Coronary calcium  score of 623. This was 23 rd percentile for age and sex matched control.  No CP, no dyspnea,  no syncope  O2 fitness 3-4 times a week.     Fam hx  Mother has CABG, CHF, stents Father had TAVR.    Hx of HLD , atorvastatin  20 mg   Works in [employer]   Dec 04, 2023 Anthony Steele is seen for follow up of his coronary artery calcifications , HLD  Coronary calcium  score of 623. This was 9 rd percentile for age and sex matched control. BP is a bit high  Watches his salt for the most part Ate asian food last night with lots of salt BP is generally ok at home   Will check lipids / ALT, BMP today  His LDL goal is < 70    Past Medical History:  Diagnosis Date   ANXIETY DISORDER, GENERALIZED 08/22/2006   Qualifier: Diagnosis of  By: Daina Drum MD, [name]     Carpal tunnel syndrome, right 05/31/2012   CERVICAL RADICULOPATHY 08/22/2006   Annotation: L arm 2002 Qualifier: Diagnosis of  By: Daina Drum MD, [name]     DEGENERATIVE DISC DISEASE 08/22/2006   Qualifier: Diagnosis of  By: Daina Drum MD, [name]     GENITAL HERPES 08/22/2006   Qualifier: Diagnosis of  By: Daina Drum MD, [name]     GERD 07/28/2006   Qualifier: Diagnosis of  By: Daina Drum MD, [name]     GERD (gastroesophageal reflux disease)    HEPATITIS B, CHRONIC 07/28/2006   Qualifier: Diagnosis of  By: Daina Drum MD, [name]     HIV DISEASE 07/28/2006   Annotation: dx 2000 Qualifier: Diagnosis of  By: Daina Drum MD, [name]     HIV infection [facility])    Hyperlipidemia    Hypertension    HYPERTENSION NEC 07/07/2009   Qualifier: Diagnosis of  By:  Daina Drum MD, [name]     Kidney stones    NEPHROLITHIASIS 08/22/2006   Annotation: x2, last 1992 Qualifier: Diagnosis of  By: Daina Drum MD, [name]     Obesity 09/12/2011   PSORIASIS 08/22/2006   Qualifier: Diagnosis of  By: Daina Drum MD, [name]      Past Surgical History:  Procedure Laterality Date   COLONOSCOPY     CYSTECTOMY  2000   cyst removed off buttock cheek   LUMBAR LAMINECTOMY/DECOMPRESSION MICRODISCECTOMY Left 09/22/2023   Procedure: Laminectomy and Foraminotomy - Thoracic Eleven- Thoracic Twelve - left,  - Lumbar Three-Lumbar Four - Lumbar Four-Lumbar Five - left;  Surgeon: Joaquin Mulberry, MD;  Location: [facility] OR;  Service: Neurosurgery;  Laterality: Left;    Current Medications: Current Meds  Medication Sig   Darunavir -Cobicistat-Emtricitabine-Tenofovir  Alafenamide (SYMTUZA ) 800-150-200-10 MG TABS Take 1 tablet by mouth daily with breakfast.   diclofenac  (VOLTAREN ) 75 MG EC tablet Take 75 mg by mouth 2 (two) times daily.   ezetimibe  (ZETIA ) 10 MG tablet TAKE 1 TABLET BY MOUTH EVERY DAY   fexofenadine  (ALLEGRA ) 180 MG tablet TAKE 1 TABLET BY MOUTH EVERY DAY   Multiple Vitamins-Minerals (MULTIVITAMIN WITH MINERALS)  tablet Take 1 tablet by mouth daily.   omeprazole (PRILOSEC OTC) 20 MG tablet Take 20 mg by mouth daily.   rosuvastatin  (CRESTOR ) 20 MG tablet Take 1 tablet (20 mg total) by mouth daily.   tiZANidine  (ZANAFLEX ) 4 MG tablet Take 4 mg by mouth at bedtime.   triamcinolone  (NASACORT ) 55 MCG/ACT AERO nasal inhaler Place 2 sprays into the nose daily.   valACYclovir  (VALTREX ) 500 MG tablet TAKE 1 TABLET BY MOUTH TWICE A DAY     Allergies:   Lisinopril    Social History   Socioeconomic History   Marital status: Single    Spouse name: Not on file   Number of children: Not on file   Years of education: Not on file   Highest education level: Not on file  Occupational History   Not on file  Tobacco Use   Smoking status: Former    Current packs/day: 0.00    Types:  Cigarettes    Quit date: 07/18/2012    Years since quitting: 11.3   Smokeless tobacco: Never   Tobacco comments:    quit over cruise over the holidays  Vaping Use   Vaping status: Never Used  Substance and Sexual Activity   Alcohol use: Yes    Alcohol/week: 3.0 - 5.0 standard drinks of alcohol    Types: 3 - 5 Standard drinks or equivalent per week    Comment: occ   Drug use: No   Sexual activity: Not Currently    Comment: decline condoms  Other Topics Concern   Not on file  Social History Narrative   Not on file   Social Drivers of Health   Financial Resource Strain: Not on file  Food Insecurity: Not on file  Transportation Needs: Not on file  Physical Activity: Not on file  Stress: Not on file  Social Connections: Not on file     Family History: The patient's family history includes Arthritis in an other family member; Cancer in an other family member; Colon polyps in his sister; Diabetes in his maternal aunt; Heart disease in an other family member; Heart failure in his mother; Hypertension in an other family member. There is no history of Colon cancer, Esophageal cancer, Stomach cancer, or Rectal cancer.  ROS:   Please see the history of present illness.     All other systems reviewed and are negative.  EKGs/Labs/Other Studies Reviewed:    The following studies were reviewed today:   EKG:      Recent Labs: 04/18/2023: ALT 57; TSH 4.12 09/21/2023: BUN 18; Creatinine, Ser 0.97; Hemoglobin 14.2; Platelets 194; Potassium 4.2; Sodium 140  Recent Lipid Panel    Component Value Date/Time   CHOL 158 04/18/2023 1030   CHOL 141 08/31/2022 0800   TRIG 80.0 04/18/2023 1030   HDL 72.30 04/18/2023 1030   HDL 51 08/31/2022 0800   CHOLHDL 2 04/18/2023 1030   VLDL 16.0 04/18/2023 1030   LDLCALC 70 04/18/2023 1030   LDLCALC 80 08/31/2022 0800   LDLCALC 88 03/10/2022 1521     Risk Assessment/Calculations:      Physical Exam:    Physical Exam: Blood pressure (!)  145/86, pulse 76, height 5\' 11"  (1.803 m), weight 205 lb (93 kg), SpO2 97%.   GEN:  Well nourished, well developed in no acute distress HEENT: Normal NECK: No JVD; No carotid bruits LYMPHATICS: No lymphadenopathy CARDIAC: RRR , no murmurs, rubs, gallops RESPIRATORY:  Clear to auscultation without rales, wheezing or rhonchi  ABDOMEN: Soft, non-tender,  non-distended MUSCULOSKELETAL:  No edema; No deformity  SKIN: Warm and dry NEUROLOGIC:  Alert and oriented x 3   ASSESSMENT:    1. Mixed hyperlipidemia   2. Coronary artery calcification   3. Essential hypertension     PLAN:      Coronary calcification:   on crestor  , zetia  .  His last LDL is 70.  Check lipids, alt, BMP today  Follow up in a year  2.  HTN: Blood pressures typically well-controlled.  He ate a lot of Asian food with lots of soy sauce  last night.  His blood pressure is generally well-controlled.           Medication Adjustments/Labs and Tests Ordered: Current medicines are reviewed at length with the patient today.  Concerns regarding medicines are outlined above.  Orders Placed This Encounter  Procedures   Lipid panel   ALT   Basic metabolic panel with GFR   No orders of the defined types were placed in this encounter.   Patient Instructions  Lab Work: Lipids, ALT, BMET today If you have labs (blood work) drawn today and your tests are completely normal, you will receive your results only by: MyChart Message (if you have MyChart) OR A paper copy in the mail If you have any lab test that is abnormal or we need to change your treatment, we will call you to review the results.  Follow-Up: At [facility], you and your health needs are our priority.  As part of our continuing mission to provide you with exceptional heart care, our providers are all part of one team.  This team includes your primary Cardiologist (physician) and Advanced Practice Providers or APPs (Physician Assistants and Nurse  Practitioners) who all work together to provide you with the care you need, when you need it.  Your next appointment:   1 year(s)  Provider:   Ahmad Alert, MD          Signed, Ahmad Alert, MD  12/04/2023 10:12 AM    [facility]

## 2023-12-04 ENCOUNTER — Ambulatory Visit: Payer: 59 | Attending: Cardiovascular Disease | Admitting: Cardiovascular Disease

## 2023-12-04 ENCOUNTER — Encounter: Payer: Self-pay | Admitting: Cardiovascular Disease

## 2023-12-04 VITALS — BP 145/86 | HR 76 | Ht 71.0 in | Wt 205.0 lb

## 2023-12-04 DIAGNOSIS — I10 Essential (primary) hypertension: Secondary | ICD-10-CM

## 2023-12-04 DIAGNOSIS — I2510 Atherosclerotic heart disease of native coronary artery without angina pectoris: Secondary | ICD-10-CM | POA: Diagnosis not present

## 2023-12-04 DIAGNOSIS — E782 Mixed hyperlipidemia: Secondary | ICD-10-CM | POA: Diagnosis not present

## 2023-12-04 LAB — LIPID PANEL

## 2023-12-04 NOTE — Patient Instructions (Signed)
  Lab Work: Lipids, ALT, BMET today If you have labs (blood work) drawn today and your tests are completely normal, you will receive your results only by: MyChart Message (if you have MyChart) OR A paper copy in the mail If you have any lab test that is abnormal or we need to change your treatment, we will call you to review the results.  Follow-Up: At [facility], you and your health needs are our priority.  As part of our continuing mission to provide you with exceptional heart care, our providers are all part of one team.  This team includes your primary Cardiologist (physician) and Advanced Practice Providers or APPs (Physician Assistants and Nurse Practitioners) who all work together to provide you with the care you need, when you need it.  Your next appointment:   1 year(s)  Provider:   Ahmad Alert, MD

## 2023-12-05 ENCOUNTER — Ambulatory Visit: Payer: Self-pay | Admitting: Cardiovascular Disease

## 2023-12-05 DIAGNOSIS — M21372 Foot drop, left foot: Secondary | ICD-10-CM | POA: Diagnosis not present

## 2023-12-05 LAB — LIPID PANEL
Cholesterol, Total: 132 mg/dL (ref 100–199)
HDL: 57 mg/dL (ref >39)
LDL CALC COMMENT:: 2.3 ratio (ref 0.0–5.0)
LDL Chol Calc (NIH): 66 mg/dL (ref 0–99)
Triglycerides: 37 mg/dL (ref 0–149)
VLDL Cholesterol Cal: 9 mg/dL (ref 5–40)

## 2023-12-05 LAB — BASIC METABOLIC PANEL WITH GFR
BUN/Creatinine Ratio: 21 (ref 10–24)
BUN: 18 mg/dL (ref 8–27)
CO2: 24 mmol/L (ref 20–29)
Calcium: 9.8 mg/dL (ref 8.6–10.2)
Chloride: 102 mmol/L (ref 96–106)
Creatinine, Ser: 0.85 mg/dL (ref 0.76–1.27)
Glucose: 93 mg/dL (ref 70–99)
Potassium: 4.7 mmol/L (ref 3.5–5.2)
Sodium: 142 mmol/L (ref 134–144)
eGFR: 99 mL/min/{1.73_m2} (ref >59)

## 2023-12-05 LAB — ALT: ALT: 35 IU/L (ref 0–44)

## 2023-12-07 DIAGNOSIS — M21372 Foot drop, left foot: Secondary | ICD-10-CM | POA: Diagnosis not present

## 2023-12-12 DIAGNOSIS — M21372 Foot drop, left foot: Secondary | ICD-10-CM | POA: Diagnosis not present

## 2023-12-14 DIAGNOSIS — M21372 Foot drop, left foot: Secondary | ICD-10-CM | POA: Diagnosis not present

## 2023-12-15 ENCOUNTER — Other Ambulatory Visit (HOSPITAL_COMMUNITY): Payer: Self-pay

## 2023-12-15 DIAGNOSIS — H25811 Combined forms of age-related cataract, right eye: Secondary | ICD-10-CM | POA: Diagnosis not present

## 2023-12-19 ENCOUNTER — Other Ambulatory Visit: Payer: Self-pay

## 2023-12-19 NOTE — Progress Notes (Signed)
 Specialty Pharmacy Refill Coordination Note  Anthony Steele is a 63 y.o. male contacted today regarding refills of specialty medication(s) Darun-Cobic-Emtricit-TenofAF (Symtuza )   Patient requested [name] at [facility] at [place] date: 12/25/23   Medication will be filled on 05.30.25.

## 2023-12-21 NOTE — Progress Notes (Signed)
 The 10-year ASCVD risk score (Arnett DK, et al., 2019) is: 8.1%   Values used to calculate the score:     Age: 63 years     Sex: Male     Is Non-Hispanic African American: No     Diabetic: No     Tobacco smoker: No     Systolic Blood Pressure: 153 mmHg     Is BP treated: No     HDL Cholesterol: 57 mg/dL     Total Cholesterol: 132 mg/dL  Currently prescribed rosuvastatin  20 mg.  Radford Pease, BSN, RN

## 2023-12-22 ENCOUNTER — Other Ambulatory Visit: Payer: Self-pay

## 2023-12-24 ENCOUNTER — Other Ambulatory Visit: Payer: Self-pay | Admitting: Cardiovascular Disease

## 2023-12-24 DIAGNOSIS — E782 Mixed hyperlipidemia: Secondary | ICD-10-CM

## 2023-12-24 DIAGNOSIS — Z79899 Other long term (current) drug therapy: Secondary | ICD-10-CM

## 2023-12-26 DIAGNOSIS — M21372 Foot drop, left foot: Secondary | ICD-10-CM | POA: Diagnosis not present

## 2024-01-02 DIAGNOSIS — M21372 Foot drop, left foot: Secondary | ICD-10-CM | POA: Diagnosis not present

## 2024-01-05 DIAGNOSIS — M21372 Foot drop, left foot: Secondary | ICD-10-CM | POA: Diagnosis not present

## 2024-01-09 DIAGNOSIS — M21372 Foot drop, left foot: Secondary | ICD-10-CM | POA: Diagnosis not present

## 2024-01-12 DIAGNOSIS — M21372 Foot drop, left foot: Secondary | ICD-10-CM | POA: Diagnosis not present

## 2024-01-22 ENCOUNTER — Other Ambulatory Visit: Payer: Self-pay

## 2024-01-23 ENCOUNTER — Other Ambulatory Visit (HOSPITAL_COMMUNITY): Payer: Self-pay

## 2024-01-23 NOTE — Progress Notes (Signed)
 Specialty Pharmacy Refill Coordination Note  Anthony Steele is a 62 y.o. male contacted today regarding refills of specialty medication(s) Darun-Cobic-Emtricit-TenofAF (Symtuza )   Patient requested [name] at [facility] at [place] date: 01/24/24   Medication will be filled on 01/23/24.

## 2024-02-06 ENCOUNTER — Other Ambulatory Visit: Payer: Self-pay | Admitting: Internal Medicine

## 2024-02-06 DIAGNOSIS — A6000 Herpesviral infection of urogenital system, unspecified: Secondary | ICD-10-CM

## 2024-02-12 ENCOUNTER — Other Ambulatory Visit: Payer: Self-pay

## 2024-02-14 DIAGNOSIS — H2512 Age-related nuclear cataract, left eye: Secondary | ICD-10-CM | POA: Diagnosis not present

## 2024-02-15 ENCOUNTER — Other Ambulatory Visit (HOSPITAL_COMMUNITY): Payer: Self-pay

## 2024-02-16 DIAGNOSIS — H25812 Combined forms of age-related cataract, left eye: Secondary | ICD-10-CM | POA: Diagnosis not present

## 2024-02-21 ENCOUNTER — Other Ambulatory Visit: Payer: Self-pay

## 2024-02-21 NOTE — Progress Notes (Signed)
 Specialty Pharmacy Refill Coordination Note  Anthony Steele is a 63 y.o. male contacted today regarding refills of specialty medication(s) Darun-Cobic-Emtricit-TenofAF (Symtuza )   Patient requested [name] at [facility] at [place] date: 02/23/24   Medication will be filled on 02/22/24.

## 2024-03-19 ENCOUNTER — Other Ambulatory Visit: Payer: Self-pay

## 2024-03-19 ENCOUNTER — Other Ambulatory Visit: Payer: Self-pay | Admitting: Internal Medicine

## 2024-03-19 DIAGNOSIS — B20 Human immunodeficiency virus [HIV] disease: Secondary | ICD-10-CM

## 2024-03-19 NOTE — Progress Notes (Signed)
 Specialty Pharmacy Refill Coordination Note  Anthony Steele is a 63 y.o. male contacted today regarding refills of specialty medication(s) Darun-Cobic-Emtricit-TenofAF (Symtuza )   Patient requested (Patient-Rptd) Pickup at [facility] at [facility] date: (Patient-Rptd) 03/22/24   Medication will be filled on 03/21/24, pending refill approval.

## 2024-03-19 NOTE — Telephone Encounter (Signed)
 Patient needs appointment, MyChart message sent.  [name], BSN, RN

## 2024-03-21 ENCOUNTER — Other Ambulatory Visit: Payer: Self-pay

## 2024-03-21 MED ORDER — SYMTUZA 800-150-200-10 MG PO TABS
1.0000 | ORAL_TABLET | Freq: Every day | ORAL | 1 refills | Status: DC
  Filled 2024-03-21: qty 30, 30d supply, fill #0

## 2024-03-29 ENCOUNTER — Other Ambulatory Visit: Payer: Self-pay

## 2024-03-29 DIAGNOSIS — Z79899 Other long term (current) drug therapy: Secondary | ICD-10-CM

## 2024-03-29 DIAGNOSIS — Z113 Encounter for screening for infections with a predominantly sexual mode of transmission: Secondary | ICD-10-CM

## 2024-03-29 DIAGNOSIS — B20 Human immunodeficiency virus [HIV] disease: Secondary | ICD-10-CM

## 2024-04-03 ENCOUNTER — Other Ambulatory Visit

## 2024-04-03 ENCOUNTER — Other Ambulatory Visit (HOSPITAL_COMMUNITY)
Admission: RE | Admit: 2024-04-03 | Discharge: 2024-04-03 | Disposition: A | Discharge status: Home / Self Care | Source: Ambulatory Visit | Attending: Internal Medicine | Admitting: Internal Medicine

## 2024-04-03 ENCOUNTER — Other Ambulatory Visit: Payer: Self-pay

## 2024-04-03 DIAGNOSIS — Z113 Encounter for screening for infections with a predominantly sexual mode of transmission: Secondary | ICD-10-CM | POA: Insufficient documentation

## 2024-04-03 DIAGNOSIS — B20 Human immunodeficiency virus [HIV] disease: Secondary | ICD-10-CM | POA: Diagnosis not present

## 2024-04-04 LAB — URINE CYTOLOGY ANCILLARY ONLY
Chlamydia: NEGATIVE
Comment: NEGATIVE
Comment: NORMAL
Neisseria Gonorrhea: NEGATIVE

## 2024-04-04 LAB — T-HELPER CELL (CD4) - (RCID CLINIC ONLY)
CD4 % Helper T Cell: 26 % — ABNORMAL LOW (ref 33–65)
CD4 T Cell Abs: 684 /uL (ref 400–1790)

## 2024-04-05 LAB — CBC WITH DIFFERENTIAL/PLATELET
Absolute Lymphocytes: 2693 {cells}/uL (ref 850–3900)
Absolute Monocytes: 578 {cells}/uL (ref 200–950)
Basophils Absolute: 48 {cells}/uL (ref 0–200)
Basophils Relative: 0.7 %
Eosinophils Absolute: 41 {cells}/uL (ref 15–500)
Eosinophils Relative: 0.6 %
HCT: 43.6 % (ref 38.5–50.0)
Hemoglobin: 14.4 g/dL (ref 13.2–17.1)
MCH: 33.2 pg — ABNORMAL HIGH (ref 27.0–33.0)
MCHC: 33 g/dL (ref 32.0–36.0)
MCV: 100.5 fL — ABNORMAL HIGH (ref 80.0–100.0)
MPV: 10.6 fL (ref 7.5–12.5)
Monocytes Relative: 8.5 %
Neutro Abs: 3441 {cells}/uL (ref 1500–7800)
Neutrophils Relative %: 50.6 %
Platelets: 181 Thousand/uL (ref 140–400)
RBC: 4.34 Million/uL (ref 4.20–5.80)
RDW: 12.5 % (ref 11.0–15.0)
Total Lymphocyte: 39.6 %
WBC: 6.8 Thousand/uL (ref 3.8–10.8)

## 2024-04-05 LAB — RPR: RPR Ser Ql: NONREACTIVE

## 2024-04-05 LAB — COMPLETE METABOLIC PANEL WITHOUT GFR
AG Ratio: 2.1 (calc) (ref 1.0–2.5)
ALT: 34 U/L (ref 9–46)
AST: 33 U/L (ref 10–35)
Albumin: 4.9 g/dL (ref 3.6–5.1)
Alkaline phosphatase (APISO): 56 U/L (ref 35–144)
BUN: 18 mg/dL (ref 7–25)
CO2: 31 mmol/L (ref 20–32)
Calcium: 10.3 mg/dL (ref 8.6–10.3)
Chloride: 102 mmol/L (ref 98–110)
Creat: 0.84 mg/dL (ref 0.70–1.35)
Globulin: 2.3 g/dL (ref 1.9–3.7)
Glucose, Bld: 110 mg/dL — ABNORMAL HIGH (ref 65–99)
Potassium: 4.6 mmol/L (ref 3.5–5.3)
Sodium: 140 mmol/L (ref 135–146)
Total Bilirubin: 0.6 mg/dL (ref 0.2–1.2)
Total Protein: 7.2 g/dL (ref 6.1–8.1)

## 2024-04-05 LAB — HIV-1 RNA QUANT-NO REFLEX-BLD
HIV 1 RNA Quant: 29 {copies}/mL — ABNORMAL HIGH
HIV-1 RNA Quant, Log: 1.46 {Log_copies}/mL — ABNORMAL HIGH

## 2024-04-17 ENCOUNTER — Other Ambulatory Visit: Payer: Self-pay

## 2024-04-17 ENCOUNTER — Ambulatory Visit (INDEPENDENT_AMBULATORY_CARE_PROVIDER_SITE_OTHER): Admitting: Internal Medicine

## 2024-04-17 ENCOUNTER — Other Ambulatory Visit (HOSPITAL_COMMUNITY): Payer: Self-pay

## 2024-04-17 VITALS — BP 141/92 | HR 58 | Temp 99.1°F | Resp 16 | Wt 207.4 lb

## 2024-04-17 DIAGNOSIS — B20 Human immunodeficiency virus [HIV] disease: Secondary | ICD-10-CM

## 2024-04-17 DIAGNOSIS — Z23 Encounter for immunization: Secondary | ICD-10-CM | POA: Diagnosis not present

## 2024-04-17 DIAGNOSIS — B189 Chronic viral hepatitis, unspecified: Secondary | ICD-10-CM

## 2024-04-17 MED ORDER — SYMTUZA 800-150-200-10 MG PO TABS
1.0000 | ORAL_TABLET | Freq: Every day | ORAL | 11 refills | Status: DC
  Filled 2024-04-17: qty 30, 30d supply, fill #0

## 2024-04-17 MED ORDER — BICTEGRAVIR-EMTRICITAB-TENOFOV 50-200-25 MG PO TABS
1.0000 | ORAL_TABLET | Freq: Every day | ORAL | 5 refills | Status: DC
  Filled 2024-04-17: qty 30, 30d supply, fill #0
  Filled 2024-05-14: qty 30, 30d supply, fill #1

## 2024-04-17 NOTE — Patient Instructions (Signed)
 Stop symtuza  Start biktravy

## 2024-04-17 NOTE — Progress Notes (Signed)
 [facility]     HPI: Anthony Steele is a 63 y.o. male presents for HIV management, symtuza . No missed doses. Pt had back surgery in feb, 2025.  One partner since last visit.   Past Medical History:  Diagnosis Date   ANXIETY DISORDER, GENERALIZED 08/22/2006   Qualifier: Diagnosis of  By: Elaine MD, [name]     Carpal tunnel syndrome, right 05/31/2012   CERVICAL RADICULOPATHY 08/22/2006   Annotation: L arm 2002 Qualifier: Diagnosis of  By: Elaine MD, [name]     DEGENERATIVE DISC DISEASE 08/22/2006   Qualifier: Diagnosis of  By: Elaine MD, [name]     GENITAL HERPES 08/22/2006   Qualifier: Diagnosis of  By: Elaine MD, [name]     GERD 07/28/2006   Qualifier: Diagnosis of  By: Elaine MD, [name]     GERD (gastroesophageal reflux disease)    HEPATITIS B, CHRONIC 07/28/2006   Qualifier: Diagnosis of  By: Elaine MD, [name]     HIV DISEASE 07/28/2006   Annotation: dx 2000 Qualifier: Diagnosis of  By: Elaine MD, [name]     HIV infection [facility])    Hyperlipidemia    Hypertension    HYPERTENSION NEC 07/07/2009   Qualifier: Diagnosis of  By: Elaine MD, [name]     Kidney stones    NEPHROLITHIASIS 08/22/2006   Annotation: x2, last 1992 Qualifier: Diagnosis of  By: Elaine MD, [name]     Obesity 09/12/2011   PSORIASIS 08/22/2006   Qualifier: Diagnosis of  By: Elaine MD, [name]      Past Surgical History:  Procedure Laterality Date   COLONOSCOPY     CYSTECTOMY  2000   cyst removed off buttock cheek   LUMBAR LAMINECTOMY/DECOMPRESSION MICRODISCECTOMY Left 09/22/2023   Procedure: Laminectomy and Foraminotomy - Thoracic Eleven- Thoracic Twelve - left,  - Lumbar Three-Lumbar Four - Lumbar Four-Lumbar Five - left;  Surgeon: Joshua Alm [place], MD;  Location: [facility] OR;  Service: Neurosurgery;  Laterality: Left;    Family History  Problem Relation Age of Onset   Heart failure Mother    Colon polyps Sister    Diabetes Maternal Aunt    Heart disease Other     Hypertension Other    Arthritis Other    Cancer Other        colon cancer   Colon cancer Neg Hx    Esophageal cancer Neg Hx    Stomach cancer Neg Hx    Rectal cancer Neg Hx    Current Outpatient Medications on File Prior to Visit  Medication Sig Dispense Refill   Darunavir -Cobicistat-Emtricitabine -Tenofovir  Alafenamide (SYMTUZA ) 800-150-200-10 MG TABS Take 1 tablet by mouth daily with breakfast. 30 tablet 1   diclofenac  (VOLTAREN ) 75 MG EC tablet Take 75 mg by mouth 2 (two) times daily.     esomeprazole (NEXIUM) 40 MG capsule Take 40 mg by mouth daily.     ezetimibe  (ZETIA ) 10 MG tablet TAKE 1 TABLET BY MOUTH EVERY DAY 90 tablet 3   fexofenadine  (ALLEGRA ) 180 MG tablet TAKE 1 TABLET BY MOUTH EVERY DAY 90 tablet 2   Multiple Vitamins-Minerals (MULTIVITAMIN WITH MINERALS) tablet Take 1 tablet by mouth daily.     rosuvastatin  (CRESTOR ) 20 MG tablet Take 1 tablet (20 mg total) by mouth daily. 90 tablet 1   triamcinolone  (NASACORT ) 55 MCG/ACT AERO nasal inhaler Place 2 sprays into the nose daily. 1 each 12   valACYclovir  (VALTREX ) 500 MG tablet TAKE 1 TABLET BY MOUTH TWICE A  DAY 60 tablet 5   tiZANidine  (ZANAFLEX ) 4 MG tablet Take 4 mg by mouth at bedtime. (Patient not taking: Reported on 04/17/2024)     No current facility-administered medications on file prior to visit.    Allergies  Allergen Reactions   Lisinopril  Rash      Lab Results HIV 1 RNA Quant  Date Value  04/03/2024 29 copies/mL (H)  03/15/2023 Not Detected Copies/mL  03/10/2022 Not Detected Copies/mL   CD4 T Cell Abs (/uL)  Date Value  04/03/2024 684  03/15/2023 561  03/10/2022 714   No results found for: HIV1GENOSEQ Lab Results  Component Value Date   WBC 6.8 04/03/2024   HGB 14.4 04/03/2024   HCT 43.6 04/03/2024   MCV 100.5 (H) 04/03/2024   PLT 181 04/03/2024    Lab Results  Component Value Date   CREATININE 0.84 04/03/2024   BUN 18 04/03/2024   NA 140 04/03/2024   K 4.6 04/03/2024   CL 102  04/03/2024   CO2 31 04/03/2024   Lab Results  Component Value Date   ALT 34 04/03/2024   AST 33 04/03/2024   ALKPHOS 61 04/18/2023   BILITOT 0.6 04/03/2024    Lab Results  Component Value Date   CHOL 132 12/04/2023   TRIG 37 12/04/2023   HDL 57 12/04/2023   LDLCALC 66 12/04/2023   No results found for: HAV Lab Results  Component Value Date   HEPBSAG REACTIVE (A) 03/10/2022   HEPBSAB NON-REACTIVE 03/10/2022   Lab Results  Component Value Date   HCVAB NEGATIVE 02/13/2012   Lab Results  Component Value Date   CHLAMYDIAWP Negative 04/03/2024   N Negative 04/03/2024   No results found for: GCPROBEAPT No results found for: QUANTGOLD  Assessment/Plan #HIV -[id number], VL 29, on 04/03/24 -labs next visit -stop symtuza , start biktravy. Met with pahrmacy F/u in one month   #Hep B - covered symtuza  -labs nv  #Vaccination COVID on 08/09/2022 Flu 2023, today Monkeypox PCV-13 2014 Meningitis HepA Tdap Shingles  #Health maintenance -Quantiferon NV -RPR 04/03/24 -HCV nv -GC urine negative -Lipid on risuvastain -Colonoscopy 07/31/23    [name], MD [facility] I have personally spent 41 minutes involved in face-to-face and non-face-to-face activities for this patient on the day of the visit. Professional time spent includes the following activities: Preparing to see the patient (review of tests), Obtaining and/or reviewing separately obtained history (admission/discharge record), Performing a medically appropriate examination and/or evaluation , Ordering medications/tests/procedures, referring and communicating with other health care professionals, Documenting clinical information in the EMR, Independently interpreting results (not separately reported), Communicating results to the patient/family/caregiver, Counseling and educating the patient/family/caregiver and Care coordination (not separately reported).

## 2024-04-17 NOTE — Progress Notes (Signed)
 Counseled Anthony Steele on Biktarvy . He is transitioning from Symtuza  to Biktarvy . Counseled Biktarvy  is one tablet once daily without regard to meals. Counseled him to take Biktarvy , then his multivitamin 2 hours later. Confirmed he prefers it be sent to [facility].   Izetta Carl, PharmD PGY1 Pharmacy Resident

## 2024-04-18 ENCOUNTER — Encounter: Payer: Self-pay | Admitting: Internal Medicine

## 2024-04-18 ENCOUNTER — Other Ambulatory Visit: Payer: Self-pay

## 2024-04-18 ENCOUNTER — Other Ambulatory Visit: Payer: Self-pay | Admitting: Pharmacist

## 2024-04-18 ENCOUNTER — Other Ambulatory Visit (HOSPITAL_COMMUNITY): Payer: Self-pay

## 2024-04-18 ENCOUNTER — Ambulatory Visit: Admitting: Internal Medicine

## 2024-04-18 ENCOUNTER — Ambulatory Visit: Payer: Self-pay | Admitting: Internal Medicine

## 2024-04-18 VITALS — BP 140/98 | HR 59 | Temp 99.1°F | Ht 71.0 in | Wt 206.0 lb

## 2024-04-18 DIAGNOSIS — Z0001 Encounter for general adult medical examination with abnormal findings: Secondary | ICD-10-CM

## 2024-04-18 DIAGNOSIS — R739 Hyperglycemia, unspecified: Secondary | ICD-10-CM

## 2024-04-18 DIAGNOSIS — E559 Vitamin D deficiency, unspecified: Secondary | ICD-10-CM | POA: Diagnosis not present

## 2024-04-18 DIAGNOSIS — A6000 Herpesviral infection of urogenital system, unspecified: Secondary | ICD-10-CM

## 2024-04-18 DIAGNOSIS — Z23 Encounter for immunization: Secondary | ICD-10-CM | POA: Diagnosis not present

## 2024-04-18 DIAGNOSIS — L989 Disorder of the skin and subcutaneous tissue, unspecified: Secondary | ICD-10-CM | POA: Diagnosis not present

## 2024-04-18 DIAGNOSIS — E538 Deficiency of other specified B group vitamins: Secondary | ICD-10-CM | POA: Diagnosis not present

## 2024-04-18 DIAGNOSIS — I10 Essential (primary) hypertension: Secondary | ICD-10-CM

## 2024-04-18 DIAGNOSIS — E782 Mixed hyperlipidemia: Secondary | ICD-10-CM | POA: Diagnosis not present

## 2024-04-18 DIAGNOSIS — Z0000 Encounter for general adult medical examination without abnormal findings: Secondary | ICD-10-CM

## 2024-04-18 DIAGNOSIS — Z125 Encounter for screening for malignant neoplasm of prostate: Secondary | ICD-10-CM

## 2024-04-18 LAB — TSH: TSH: 3.16 u[IU]/mL (ref 0.35–5.50)

## 2024-04-18 LAB — BASIC METABOLIC PANEL WITH GFR
BUN: 19 mg/dL (ref 6–23)
CO2: 28 meq/L (ref 19–32)
Calcium: 9.5 mg/dL (ref 8.4–10.5)
Chloride: 103 meq/L (ref 96–112)
Creatinine, Ser: 0.86 mg/dL (ref 0.40–1.50)
GFR: 93.15 mL/min (ref >60.00)
Glucose, Bld: 98 mg/dL (ref 70–99)
Potassium: 4.5 meq/L (ref 3.5–5.1)
Sodium: 140 meq/L (ref 135–145)

## 2024-04-18 LAB — LIPID PANEL
Cholesterol: 131 mg/dL (ref 0–200)
HDL: 56.1 mg/dL (ref >39.00)
LDL Cholesterol: 64 mg/dL (ref 0–99)
NonHDL: 75.18
Total CHOL/HDL Ratio: 2
Triglycerides: 55 mg/dL (ref 0.0–149.0)
VLDL: 11 mg/dL (ref 0.0–40.0)

## 2024-04-18 LAB — VITAMIN B12: Vitamin B-12: 447 pg/mL (ref 211–911)

## 2024-04-18 LAB — PSA: PSA: 0.23 ng/mL (ref 0.10–4.00)

## 2024-04-18 LAB — HEMOGLOBIN A1C: Hgb A1c MFr Bld: 5.4 % (ref 4.6–6.5)

## 2024-04-18 LAB — VITAMIN D 25 HYDROXY (VIT D DEFICIENCY, FRACTURES): VITD: 24.59 ng/mL — ABNORMAL LOW (ref 30.00–100.00)

## 2024-04-18 MED ORDER — FEXOFENADINE HCL 180 MG PO TABS: 180.0000 mg | ORAL_TABLET | Freq: Every day | ORAL | 3 refills | Status: AC

## 2024-04-18 MED ORDER — TRIAMCINOLONE ACETONIDE 55 MCG/ACT NA AERO: 2.0000 | INHALATION_SPRAY | Freq: Every day | NASAL | 12 refills | Status: AC

## 2024-04-18 MED ORDER — VALACYCLOVIR HCL 500 MG PO TABS: 500.0000 mg | ORAL_TABLET | Freq: Two times a day (BID) | ORAL | 5 refills | Status: AC

## 2024-04-18 NOTE — Progress Notes (Signed)
 Specialty Pharmacy Initial Fill Coordination Note  Anthony Steele is a 63 y.o. male contacted today regarding initial fill of specialty medication(s) Bictegravir-Emtricitab-Tenofov (BIKTARVY )   Patient requested Delivery   Pickup date: 04/22/24   Medication will be filled on 04/19/24.   Patient is aware of $ copayment.

## 2024-04-18 NOTE — Assessment & Plan Note (Signed)
For derm referral as requested

## 2024-04-18 NOTE — Progress Notes (Signed)
 Specialty Pharmacy Initiation Note   Anthony Steele is a 62 y.o. male who will be followed by the specialty pharmacy service for RxSp HIV    Review of administration, indication, effectiveness, safety, potential side effects, storage/disposable, and missed dose instructions occurred today for patient's specialty medication(s) Bictegravir-Emtricitab-Tenofov (BIKTARVY )     Patient/Caregiver did not have any additional questions or concerns.   Patient's therapy is appropriate to: Initiate    Goals Addressed             This Visit's Progress    Achieve Undetectable HIV Viral Load < 20   On track    Patient is on track. Patient will maintain adherence      Comply with lab assessments       Patient is on track. Patient will adhere to provider and/or lab appointments      Increase CD4 count until steady state       Patient is on track. Patient will maintain adherence      Maintain optimal adherence to therapy       Patient is on track. Patient will maintain adherence         Alan JINNY Geralds Specialty Pharmacist

## 2024-04-18 NOTE — Assessment & Plan Note (Signed)
 Lab Results  Component Value Date   LDLCALC 64 04/18/2024   Stable, pt to continue current statin crestor  20 mg every day, zetia  10 qd

## 2024-04-18 NOTE — Assessment & Plan Note (Signed)
 Last vitamin D  Lab Results  Component Value Date   VD25OH 24.59 (L) 04/18/2024   Low, to start oral replacement

## 2024-04-18 NOTE — Progress Notes (Signed)
 Patient ID: Anthony Steele, male   DOB: 12-27-1960, 63 y.o.   MRN: [id number]         Chief Complaint:: wellness exam and low vit d, hld, hyperglycemia, htn       HPI:  [name] is a 63 y.o. male here for wellness exam; due for prevnar 20, o/w up to date                        Also Pt denies chest pain, increased sob or doe, wheezing, orthopnea, PND, increased LE swelling, palpitations, dizziness or syncope.   Pt denies polydipsia, polyuria, or new focal neuro s/s.    Pt denies fever, wt loss, night sweats, loss of appetite, or other constitutional symptoms  Does have need for derm referral for whole body check and hx of lesions removed in the past.  Did also have bilateral cataracts and recent dual surgury t spine and ls spine per Dr Joshua NS, less active during this time but now going to gym several times per wk.     Wt Readings from Last 3 Encounters:  04/18/24 206 lb (93.4 kg)  04/17/24 207 lb 6.4 oz (94.1 kg)  12/04/23 205 lb (93 kg)   BP Readings from Last 3 Encounters:  04/18/24 (!) 140/98  04/17/24 (!) 141/92  12/04/23 (!) 145/86   Immunization History  Administered Date(s) Administered   Influenza Split 04/19/2011, 05/31/2012   Influenza Whole 08/22/2006, 05/08/2007, 06/24/2008, 05/27/2010   Influenza, Seasonal, Injecte, Preservative Fre 03/22/2023, 04/17/2024   Influenza,inj,Quad PF,6+ Mos 03/26/2013, 05/13/2014, 06/29/2015, 05/25/2016, 04/06/2017, 04/11/2018, 05/13/2020, 03/23/2021, 04/14/2022   Influenza-Unspecified 04/02/2021   PFIZER(Purple Top)SARS-COV-2 Vaccination 09/28/2019, 10/26/2019, 05/22/2020, 12/04/2020   PNEUMOCOCCAL CONJUGATE-20 04/18/2024   Pfizer Covid-19 Vaccine Bivalent Booster 52yrs & up 05/18/2021   Pfizer(Comirnaty)Fall Seasonal Vaccine 12 years and older 08/09/2022   Pneumococcal Conjugate-13 06/18/2013   Pneumococcal Polysaccharide-23 08/21/2006, 09/12/2011   Tdap 05/31/2012, 02/19/2019   Vaccinia,smallpox Monkeypox Vaccine Live,pf 03/08/2021    Zoster Recombinant(Shingrix) 02/25/2020, 04/27/2020   Health Maintenance Due  Topic Date Due   Hepatitis B Vaccines 19-59 Average Risk (1 of 3 - Risk 3-dose series) 04/26/2022      Past Medical History:  Diagnosis Date   ANXIETY DISORDER, GENERALIZED 08/22/2006   Qualifier: Diagnosis of  By: Elaine MD, Alexine Pilant     Carpal tunnel syndrome, right 05/31/2012   CERVICAL RADICULOPATHY 08/22/2006   Annotation: L arm 2002 Qualifier: Diagnosis of  By: Elaine MD, Lavida Patch     DEGENERATIVE DISC DISEASE 08/22/2006   Qualifier: Diagnosis of  By: Elaine MD, Metzli Pollick     GENITAL HERPES 08/22/2006   Qualifier: Diagnosis of  By: Elaine MD, Jawaan Adachi     GERD 07/28/2006   Qualifier: Diagnosis of  By: Elaine MD, Dineen Conradt     GERD (gastroesophageal reflux disease)    HEPATITIS B, CHRONIC 07/28/2006   Qualifier: Diagnosis of  By: Elaine MD, Zamyra Allensworth     HIV DISEASE 07/28/2006   Annotation: dx 2000 Qualifier: Diagnosis of  By: Elaine MD, Sevan Mcbroom     HIV infection [facility])    Hyperlipidemia    Hypertension    HYPERTENSION NEC 07/07/2009   Qualifier: Diagnosis of  By: Elaine MD, Sherrise Liberto     Kidney stones    NEPHROLITHIASIS 08/22/2006   Annotation: x2, last 1992 Qualifier: Diagnosis of  By: Elaine MD, Ezreal Turay     Obesity 09/12/2011   PSORIASIS 08/22/2006   Qualifier: Diagnosis of  By: Elaine MD, Norleen  Past Surgical History:  Procedure Laterality Date   COLONOSCOPY     CYSTECTOMY  2000   cyst removed off buttock cheek   LUMBAR LAMINECTOMY/DECOMPRESSION MICRODISCECTOMY Left 09/22/2023   Procedure: Laminectomy and Foraminotomy - Thoracic Eleven- Thoracic Twelve - left,  - Lumbar Three-Lumbar Four - Lumbar Four-Lumbar Five - left;  Surgeon: Joshua Alm Hamilton, MD;  Location: [facility] OR;  Service: Neurosurgery;  Laterality: Left;    reports that he quit smoking about 11 years ago. His smoking use included cigarettes. He has never used smokeless tobacco. He reports current alcohol use of about 3.0 - 5.0 standard  drinks of alcohol per week. He reports that he does not use drugs. family history includes Arthritis in an other family member; Cancer in an other family member; Colon polyps in his sister; Diabetes in his maternal aunt; Heart disease in an other family member; Heart failure in his mother; Hypertension in an other family member. Allergies  Allergen Reactions   Lisinopril  Rash   Current Outpatient Medications on File Prior to Visit  Medication Sig Dispense Refill   bictegravir-emtricitabine -tenofovir  AF (BIKTARVY ) 50-200-25 MG TABS tablet Take 1 tablet by mouth daily. Try to take at the same time each day with or without food. 30 tablet 5   diclofenac  (VOLTAREN ) 75 MG EC tablet Take 75 mg by mouth 2 (two) times daily.     esomeprazole (NEXIUM) 40 MG capsule Take 40 mg by mouth daily.     ezetimibe  (ZETIA ) 10 MG tablet TAKE 1 TABLET BY MOUTH EVERY DAY 90 tablet 3   Multiple Vitamins-Minerals (MULTIVITAMIN WITH MINERALS) tablet Take 1 tablet by mouth daily.     rosuvastatin  (CRESTOR ) 20 MG tablet Take 1 tablet (20 mg total) by mouth daily. 90 tablet 1   tiZANidine  (ZANAFLEX ) 4 MG tablet Take 4 mg by mouth at bedtime. (Patient not taking: Reported on 04/17/2024)     No current facility-administered medications on file prior to visit.        ROS:  All others reviewed and negative.  Objective        PE:  BP (!) 140/98 (BP Location: Left Arm, Patient Position: Sitting, Cuff Size: Normal)   Pulse (!) 59   Temp 99.1 F (37.3 C) (Oral)   Ht 5' 11 (1.803 m)   Wt 206 lb (93.4 kg)   SpO2 98%   BMI 28.73 kg/m                 Constitutional: Pt appears in NAD               HENT: Head: NCAT.                Right Ear: External ear normal.                 Left Ear: External ear normal.                Eyes: . Pupils are equal, round, and reactive to light. Conjunctivae and EOM are normal               Nose: without d/c or deformity               Neck: Neck supple. Gross normal ROM                Cardiovascular: Normal rate and regular rhythm.                 Pulmonary/Chest: Effort normal and breath sounds  without rales or wheezing.                Abd:  Soft, NT, ND, + BS, no organomegaly               Neurological: Pt is alert. At baseline orientation, motor grossly intact               Skin: Skin is warm. No rashes, no other new lesions, LE edema - none               Psychiatric: Pt behavior is normal without agitation   Micro: none  Cardiac tracings I have personally interpreted today:  none  Pertinent Radiological findings (summarize): none   Lab Results  Component Value Date   WBC 6.8 04/03/2024   HGB 14.4 04/03/2024   HCT 43.6 04/03/2024   PLT 181 04/03/2024   GLUCOSE 98 04/18/2024   CHOL 131 04/18/2024   TRIG 55.0 04/18/2024   HDL 56.10 04/18/2024   LDLCALC 64 04/18/2024   ALT 34 04/03/2024   AST 33 04/03/2024   NA 140 04/18/2024   K 4.5 04/18/2024   CL 103 04/18/2024   CREATININE 0.86 04/18/2024   BUN 19 04/18/2024   CO2 28 04/18/2024   TSH 3.16 04/18/2024   PSA 0.23 04/18/2024   INR 1.0 09/21/2023   HGBA1C 5.4 04/18/2024   Assessment/Plan:  Gorman Safi is a 63 y.o. White or Caucasian [1] male with  has a past medical history of ANXIETY DISORDER, GENERALIZED (08/22/2006), Carpal tunnel syndrome, right (05/31/2012), CERVICAL RADICULOPATHY (08/22/2006), DEGENERATIVE DISC DISEASE (08/22/2006), GENITAL HERPES (08/22/2006), GERD (07/28/2006), GERD (gastroesophageal reflux disease), HEPATITIS B, CHRONIC (07/28/2006), HIV DISEASE (07/28/2006), HIV infection (HCC), Hyperlipidemia, Hypertension, HYPERTENSION NEC (07/07/2009), Kidney stones, NEPHROLITHIASIS (08/22/2006), Obesity (09/12/2011), and PSORIASIS (08/22/2006).  Encounter for well adult exam with abnormal findings Age and sex appropriate education and counseling updated with regular exercise and diet Referrals for preventative services - none needed Immunizations addressed - for prevnar 20 Smoking  counseling  - none needed Evidence for depression or other mood disorder - none significant Most recent labs reviewed. I have personally reviewed and have noted: 1) the patient's medical and social history 2) The patient's current medications and supplements 3) The patient's height, weight, and BMI have been recorded in the chart   Vitamin D  deficiency Last vitamin D  Lab Results  Component Value Date   VD25OH 24.59 (L) 04/18/2024   Low, to start oral replacement   Hyperlipidemia Lab Results  Component Value Date   LDLCALC 64 04/18/2024   Stable, pt to continue current statin crestor  20 mg every day, zetia  10 qd   Hyperglycemia Lab Results  Component Value Date   HGBA1C 5.4 04/18/2024   Stable, pt to continue current medical treatment  - diet, wt control   Essential hypertension BP Readings from Last 3 Encounters:  04/18/24 (!) 140/98  04/17/24 (!) 141/92  12/04/23 (!) 145/86   Uncontrolled here, but states controlled at home, pt to continue diet, wt control, declines other change   Skin lesion For derm referral as requested  Followup: Return in about 1 year (around 04/18/2025).  Lynwood Rush, MD 04/18/2024 7:44 PM [facility]

## 2024-04-18 NOTE — Patient Instructions (Addendum)
 You had the Prevnar 20 pneumonia shot today  Please continue all other medications as before, and refills have been done if requested.  Please have the pharmacy call with any other refills you may need.  Please continue your efforts at being more active, low cholesterol diet, and weight control.  You are otherwise up to date with prevention measures today.  Please keep your appointments with your specialists as you may have planned  You will be contacted regarding the referral for: Dermatology  Please go to the LAB at the blood drawing area for the tests to be done  You will be contacted by phone if any changes need to be made immediately.  Otherwise, you will receive a letter about your results with an explanation, but please check with MyChart first.  Please make an Appointment to return for your 1 year visit, or sooner if needed, with Lab testing by Appointment as well, to be done about 3-5 days before at the FIRST FLOOR Lab (so this is for TWO appointments - please see the scheduling desk as you leave)

## 2024-04-18 NOTE — Assessment & Plan Note (Signed)
 BP Readings from Last 3 Encounters:  04/18/24 (!) 140/98  04/17/24 (!) 141/92  12/04/23 (!) 145/86   Uncontrolled here, but states controlled at home, pt to continue diet, wt control, declines other change

## 2024-04-18 NOTE — Assessment & Plan Note (Signed)
 Lab Results  Component Value Date   HGBA1C 5.4 04/18/2024   Stable, pt to continue current medical treatment  - diet,wt control

## 2024-04-18 NOTE — Assessment & Plan Note (Signed)
 Age and sex appropriate education and counseling updated with regular exercise and diet Referrals for preventative services - none needed Immunizations addressed - for prevnar 20 Smoking counseling  - none needed Evidence for depression or other mood disorder - none significant Most recent labs reviewed. I have personally reviewed and have noted: 1) the patient's medical and social history 2) The patient's current medications and supplements 3) The patient's height, weight, and BMI have been recorded in the chart

## 2024-05-03 DIAGNOSIS — H43391 Other vitreous opacities, right eye: Secondary | ICD-10-CM | POA: Diagnosis not present

## 2024-05-08 ENCOUNTER — Encounter: Payer: Self-pay | Admitting: Internal Medicine

## 2024-05-08 ENCOUNTER — Telehealth: Payer: Self-pay | Admitting: Cardiovascular Disease

## 2024-05-08 DIAGNOSIS — Z8249 Family history of ischemic heart disease and other diseases of the circulatory system: Secondary | ICD-10-CM

## 2024-05-08 DIAGNOSIS — E782 Mixed hyperlipidemia: Secondary | ICD-10-CM

## 2024-05-08 DIAGNOSIS — I2510 Atherosclerotic heart disease of native coronary artery without angina pectoris: Secondary | ICD-10-CM

## 2024-05-08 DIAGNOSIS — Z79899 Other long term (current) drug therapy: Secondary | ICD-10-CM

## 2024-05-08 MED ORDER — ROSUVASTATIN CALCIUM 20 MG PO TABS: 20.0000 mg | ORAL_TABLET | Freq: Every day | ORAL | 1 refills | Status: AC

## 2024-05-08 NOTE — Telephone Encounter (Signed)
*  STAT* If patient is at the pharmacy, call can be transferred to refill team.   1. Which medications need to be refilled? (please list name of each medication and dose if known)   rosuvastatin  (CRESTOR ) 20 MG tablet   2. Would you like to learn more about the convenience, safety, & potential cost savings by using the [facility]?   3. Are you open to using the [facility] (Type [facility]. ).  4. Which pharmacy/location (including street and city if local pharmacy) is medication to be sent to?  [id number] - [place], [place] - [address]   5. Do they need a 30 day or 90 day supply?   30 day  Patient is completely out of this medication.

## 2024-05-08 NOTE — Telephone Encounter (Signed)
 RX sent in

## 2024-05-14 ENCOUNTER — Other Ambulatory Visit: Payer: Self-pay

## 2024-05-14 ENCOUNTER — Encounter (INDEPENDENT_AMBULATORY_CARE_PROVIDER_SITE_OTHER): Payer: Self-pay

## 2024-05-14 NOTE — Progress Notes (Signed)
 Specialty Pharmacy Refill Coordination Note  Anthony Steele is a 63 y.o. male contacted today regarding refills of specialty medication(s) Bictegravir-Emtricitab-Tenofov (BIKTARVY )   Patient requested [name] at [facility] at [place] date: 05/16/24   Medication will be filled on 10.22.25.

## 2024-05-15 ENCOUNTER — Encounter: Payer: Self-pay | Admitting: Internal Medicine

## 2024-05-15 ENCOUNTER — Other Ambulatory Visit (HOSPITAL_COMMUNITY): Payer: Self-pay

## 2024-05-15 ENCOUNTER — Other Ambulatory Visit: Payer: Self-pay

## 2024-05-15 ENCOUNTER — Ambulatory Visit: Admitting: Internal Medicine

## 2024-05-15 VITALS — BP 150/85 | HR 67 | Temp 98.5°F | Ht 70.0 in | Wt 201.0 lb

## 2024-05-15 DIAGNOSIS — B20 Human immunodeficiency virus [HIV] disease: Secondary | ICD-10-CM

## 2024-05-15 MED ORDER — BICTEGRAVIR-EMTRICITAB-TENOFOV 50-200-25 MG PO TABS
1.0000 | ORAL_TABLET | Freq: Every day | ORAL | 5 refills | Status: AC
  Filled 2024-06-13: qty 30, 30d supply, fill #0
  Filled 2024-07-09: qty 30, 30d supply, fill #1
  Filled 2024-08-07: qty 30, 30d supply, fill #2

## 2024-05-15 NOTE — Progress Notes (Signed)
 [facility]     HPI: Anthony Steele is a 63 y.o. male presents for HIV management, symtuza . No missed doses. Pt had back surgery in feb, 2025.  One partner since last visit.  Today 05/15/24: Tolerating biktarvy . No missed doses.  Past Medical History:  Diagnosis Date   ANXIETY DISORDER, GENERALIZED 08/22/2006   Qualifier: Diagnosis of  By: Elaine MD, Anthony     Carpal tunnel syndrome, right 05/31/2012   CERVICAL RADICULOPATHY 08/22/2006   Annotation: L arm 2002 Qualifier: Diagnosis of  By: Elaine MD, Anthony     DEGENERATIVE DISC DISEASE 08/22/2006   Qualifier: Diagnosis of  By: Elaine MD, Anthony     GENITAL HERPES 08/22/2006   Qualifier: Diagnosis of  By: Elaine MD, Anthony     GERD 07/28/2006   Qualifier: Diagnosis of  By: Elaine MD, Anthony     GERD (gastroesophageal reflux disease)    HEPATITIS B, CHRONIC 07/28/2006   Qualifier: Diagnosis of  By: Elaine MD, Anthony     HIV DISEASE 07/28/2006   Annotation: dx 2000 Qualifier: Diagnosis of  By: Elaine MD, Anthony     HIV infection [facility])    Hyperlipidemia    Hypertension    HYPERTENSION NEC 07/07/2009   Qualifier: Diagnosis of  By: Elaine MD, Anthony     Kidney stones    NEPHROLITHIASIS 08/22/2006   Annotation: x2, last 1992 Qualifier: Diagnosis of  By: Elaine MD, Anthony     Obesity 09/12/2011   PSORIASIS 08/22/2006   Qualifier: Diagnosis of  By: Elaine MD, Anthony      Past Surgical History:  Procedure Laterality Date   COLONOSCOPY     CYSTECTOMY  2000   cyst removed off buttock cheek   LUMBAR LAMINECTOMY/DECOMPRESSION MICRODISCECTOMY Left 09/22/2023   Procedure: Laminectomy and Foraminotomy - Thoracic Eleven- Thoracic Twelve - left,  - Lumbar Three-Lumbar Four - Lumbar Four-Lumbar Five - left;  Surgeon: Anthony Alm Hamilton, MD;  Location: [facility] OR;  Service: Neurosurgery;  Laterality: Left;    Family History  Problem Relation Age of Onset   Heart failure Mother    Colon polyps Sister     Diabetes Maternal Aunt    Heart disease Other    Hypertension Other    Arthritis Other    Cancer Other        colon cancer   Colon cancer Neg Hx    Esophageal cancer Neg Hx    Stomach cancer Neg Hx    Rectal cancer Neg Hx    Current Outpatient Medications on File Prior to Visit  Medication Sig Dispense Refill   bictegravir-emtricitabine -tenofovir  AF (BIKTARVY ) 50-200-25 MG TABS tablet Take 1 tablet by mouth daily. Try to take at the same time each day with or without food. 30 tablet 5   diclofenac  (VOLTAREN ) 75 MG EC tablet Take 75 mg by mouth 2 (two) times daily.     esomeprazole (NEXIUM) 40 MG capsule Take 40 mg by mouth daily.     ezetimibe  (ZETIA ) 10 MG tablet TAKE 1 TABLET BY MOUTH EVERY DAY 90 tablet 3   fexofenadine  (ALLEGRA ) 180 MG tablet Take 1 tablet (180 mg total) by mouth daily. 90 tablet 3   Multiple Vitamins-Minerals (MULTIVITAMIN WITH MINERALS) tablet Take 1 tablet by mouth daily.     rosuvastatin  (CRESTOR ) 20 MG tablet Take 1 tablet (20 mg total) by mouth daily. 90 tablet 1   tiZANidine  (ZANAFLEX ) 4 MG tablet Take 4 mg by mouth  at bedtime.     triamcinolone  (NASACORT ) 55 MCG/ACT AERO nasal inhaler Place 2 sprays into the nose daily. 1 each 12   valACYclovir  (VALTREX ) 500 MG tablet Take 1 tablet (500 mg total) by mouth 2 (two) times daily. 60 tablet 5   No current facility-administered medications on file prior to visit.    Allergies  Allergen Reactions   Lisinopril  Rash      Lab Results HIV 1 RNA Quant  Date Value  04/03/2024 29 copies/mL (H)  03/15/2023 Not Detected Copies/mL  03/10/2022 Not Detected Copies/mL   CD4 T Cell Abs (/uL)  Date Value  04/03/2024 684  03/15/2023 561  03/10/2022 714   No results found for: HIV1GENOSEQ Lab Results  Component Value Date   WBC 6.8 04/03/2024   HGB 14.4 04/03/2024   HCT 43.6 04/03/2024   MCV 100.5 (H) 04/03/2024   PLT 181 04/03/2024    Lab Results  Component Value Date   CREATININE 0.86 04/18/2024    BUN 19 04/18/2024   NA 140 04/18/2024   K 4.5 04/18/2024   CL 103 04/18/2024   CO2 28 04/18/2024   Lab Results  Component Value Date   ALT 34 04/03/2024   AST 33 04/03/2024   ALKPHOS 61 04/18/2023   BILITOT 0.6 04/03/2024    Lab Results  Component Value Date   CHOL 131 04/18/2024   TRIG 55.0 04/18/2024   HDL 56.10 04/18/2024   LDLCALC 64 04/18/2024   No results found for: HAV Lab Results  Component Value Date   HEPBSAG REACTIVE (A) 03/10/2022   HEPBSAB NON-REACTIVE 03/10/2022   Lab Results  Component Value Date   HCVAB NEGATIVE 02/13/2012   Lab Results  Component Value Date   CHLAMYDIAWP Negative 04/03/2024   N Negative 04/03/2024   No results found for: GCPROBEAPT No results found for: QUANTGOLD  Assessment/Plan #HIV -[id number], VL 29, on 04/03/24 -labs next visit -Continue biktravy.  F/u in 6 months     #Hep B - covered symtuza  -DNA today   #Vaccination COVID on 08/09/2022 Flu 2023, t9/25 Monkeypox PCV-20 04/18/24 Meningitis HepA Tdap 02/19/19 Shingles   #Health maintenance -Quantiferon NV -RPR 04/03/24 -HCV nv -GC urine negative -Lipid on risuvastain -Colonoscopy 07/31/23   Anthony Stank, MD [facility]   I have personally spent 41 minutes involved in face-to-face and non-face-to-face activities for this patient on the day of the visit. Professional time spent includes the following activities: Preparing to see the patient (review of tests), Obtaining and/or reviewing separately obtained history (admission/discharge record), Performing a medically appropriate examination and/or evaluation , Ordering medications/tests/procedures, referring and communicating with other health care professionals, Documenting clinical information in the EMR, Independently interpreting results (not separately reported), Communicating results to the patient/family/caregiver, Counseling and educating the  patient/family/caregiver and Care coordination (not separately reported).

## 2024-05-16 LAB — T-HELPER CELLS (CD4) COUNT (NOT AT ARMC)
CD4 % Helper T Cell: 25 % — ABNORMAL LOW (ref 33–65)
CD4 T Cell Abs: 317 /uL — ABNORMAL LOW (ref 400–1790)

## 2024-05-17 LAB — CBC WITH DIFFERENTIAL/PLATELET
Absolute Lymphocytes: 1267 {cells}/uL (ref 850–3900)
Absolute Monocytes: 519 {cells}/uL (ref 200–950)
Basophils Absolute: 34 {cells}/uL (ref 0–200)
Basophils Relative: 0.4 %
Eosinophils Absolute: 0 {cells}/uL — ABNORMAL LOW (ref 15–500)
Eosinophils Relative: 0 %
HCT: 44.8 % (ref 38.5–50.0)
Hemoglobin: 15.3 g/dL (ref 13.2–17.1)
MCH: 33.5 pg — ABNORMAL HIGH (ref 27.0–33.0)
MCHC: 34.2 g/dL (ref 32.0–36.0)
MCV: 98 fL (ref 80.0–100.0)
MPV: 10.9 fL (ref 7.5–12.5)
Monocytes Relative: 6.1 %
Neutro Abs: 6681 {cells}/uL (ref 1500–7800)
Neutrophils Relative %: 78.6 %
Platelets: 193 Thousand/uL (ref 140–400)
RBC: 4.57 Million/uL (ref 4.20–5.80)
RDW: 12.5 % (ref 11.0–15.0)
Total Lymphocyte: 14.9 %
WBC: 8.5 Thousand/uL (ref 3.8–10.8)

## 2024-05-17 LAB — COMPLETE METABOLIC PANEL WITHOUT GFR
AG Ratio: 2.2 (calc) (ref 1.0–2.5)
ALT: 50 U/L — ABNORMAL HIGH (ref 9–46)
AST: 38 U/L — ABNORMAL HIGH (ref 10–35)
Albumin: 5 g/dL (ref 3.6–5.1)
Alkaline phosphatase (APISO): 58 U/L (ref 35–144)
BUN: 22 mg/dL (ref 7–25)
CO2: 28 mmol/L (ref 20–32)
Calcium: 10 mg/dL (ref 8.6–10.3)
Chloride: 103 mmol/L (ref 98–110)
Creat: 0.83 mg/dL (ref 0.70–1.35)
Globulin: 2.3 g/dL (ref 1.9–3.7)
Glucose, Bld: 110 mg/dL — ABNORMAL HIGH (ref 65–99)
Potassium: 4.5 mmol/L (ref 3.5–5.3)
Sodium: 140 mmol/L (ref 135–146)
Total Bilirubin: 0.6 mg/dL (ref 0.2–1.2)
Total Protein: 7.3 g/dL (ref 6.1–8.1)

## 2024-05-17 LAB — HEPATITIS B DNA, ULTRAQUANTITATIVE, PCR
Hepatitis B DNA: NOT DETECTED [IU]/mL
Hepatitis B virus DNA: NOT DETECTED {Log_IU}/mL

## 2024-05-17 LAB — HIV-1 RNA QUANT-NO REFLEX-BLD
HIV 1 RNA Quant: NOT DETECTED {copies}/mL
HIV-1 RNA Quant, Log: NOT DETECTED {Log_copies}/mL

## 2024-05-17 LAB — HEPATITIS A ANTIBODY, TOTAL: Hepatitis A AB,Total: NONREACTIVE

## 2024-05-22 DIAGNOSIS — Z961 Presence of intraocular lens: Secondary | ICD-10-CM | POA: Diagnosis not present

## 2024-05-22 DIAGNOSIS — H43811 Vitreous degeneration, right eye: Secondary | ICD-10-CM | POA: Diagnosis not present

## 2024-06-13 ENCOUNTER — Other Ambulatory Visit: Payer: Self-pay

## 2024-06-13 ENCOUNTER — Other Ambulatory Visit (HOSPITAL_COMMUNITY): Payer: Self-pay

## 2024-06-13 NOTE — Progress Notes (Signed)
 Specialty Pharmacy Refill Coordination Note  Anthony Steele is a 63 y.o. male contacted today regarding refills of specialty medication(s) Bictegravir-Emtricitab-Tenofov (BIKTARVY )   Patient requested (Patient-Rptd) Pickup at [facility] at [name] date: (Patient-Rptd) 06/15/24   Medication will be filled on: 06/14/24

## 2024-07-04 ENCOUNTER — Encounter: Payer: Self-pay | Admitting: Internal Medicine

## 2024-07-04 MED ORDER — PODOFILOX 0.5 % EX SOLN: CUTANEOUS | 3 refills | Status: AC

## 2024-07-05 ENCOUNTER — Other Ambulatory Visit: Payer: Self-pay

## 2024-07-05 ENCOUNTER — Encounter (INDEPENDENT_AMBULATORY_CARE_PROVIDER_SITE_OTHER): Payer: Self-pay

## 2024-07-09 ENCOUNTER — Other Ambulatory Visit (HOSPITAL_COMMUNITY): Payer: Self-pay

## 2024-07-11 ENCOUNTER — Other Ambulatory Visit (HOSPITAL_COMMUNITY): Payer: Self-pay

## 2024-07-11 ENCOUNTER — Other Ambulatory Visit: Payer: Self-pay

## 2024-07-11 NOTE — Progress Notes (Signed)
 Specialty Pharmacy Refill Coordination Note  Anthony Steele is a 63 y.o. male contacted today regarding refills of specialty medication(s) Bictegravir-Emtricitab-Tenofov (BIKTARVY )   Patient requested [name] at [facility] at [place] date: 07/12/24   Medication will be filled on: 07/12/24

## 2024-07-12 ENCOUNTER — Other Ambulatory Visit: Payer: Self-pay

## 2024-07-15 ENCOUNTER — Other Ambulatory Visit: Payer: Self-pay | Admitting: Internal Medicine

## 2024-07-15 ENCOUNTER — Other Ambulatory Visit: Payer: Self-pay

## 2024-07-19 ENCOUNTER — Encounter: Payer: Self-pay | Admitting: *Deleted

## 2024-07-19 NOTE — Progress Notes (Signed)
 Anthony Steele                                          MRN: [id number]   07/19/2024   The [facility] Quality Team Specialist reviewed this patient medical record for the purposes of chart review for care gap closure. The following were reviewed: chart review for care gap closure-controlling blood pressure.    [facility]BCI Quality Team

## 2024-08-07 ENCOUNTER — Other Ambulatory Visit: Payer: Self-pay

## 2024-08-07 NOTE — Progress Notes (Signed)
 Specialty Pharmacy Refill Coordination Note  Anthony Steele is a 64 y.o. male contacted today regarding refills of specialty medication(s) Bictegravir-Emtricitab-Tenofov (BIKTARVY )   Patient requested [name] at [facility] at [place] date: 08/09/24   Medication will be filled on: 08/08/24

## 2024-08-08 ENCOUNTER — Other Ambulatory Visit: Payer: Self-pay

## 2024-08-29 ENCOUNTER — Ambulatory Visit: Payer: Self-pay

## 2024-08-29 NOTE — Telephone Encounter (Signed)
 FYI Only or Action Required?: FYI only for provider: appointment scheduled on 08/30/24.  Patient was last seen in primary care on 04/18/2024 by Norleen Lynwood ORN, MD.  Called Nurse Triage reporting Rectal Pain.  Symptoms began last week.  Interventions attempted: OTC medications: Preparation-H.  Symptoms are: unchanged.  Triage Disposition: See Physician Within 24 Hours  Patient/caregiver understands and will follow disposition?: Yes  Message from [facility] S sent at 08/29/2024  9:15 AM EST  Reason for Triage: hemorrhoid pain level 5   Reason for Disposition  MODERATE-SEVERE rectal pain (i.e., interferes with school, work, or sleep)  Answer Assessment - Initial Assessment Questions Returned call to patient who reports rectal pain.    1. SYMPTOM:  What's the main symptom you're concerned about? (e.g., pain, itching, swelling, rash)     Rectal pain  2. ONSET: When did the rectal pain start?     Last week  3. RECTAL PAIN: Do you have any pain around your rectum? How bad is the pain?  (Scale 0-10; or none, mild, moderate, severe)     5/10  4. RECTAL ITCHING: Do you have any itching in this area? How bad is the itching?  (Scale 0-10; or none, mild, moderate, severe)     Denies  5. CONSTIPATION: Do you have constipation? If Yes, ask: How often do you have a bowel movement (BM)?  (Normal range: 3 times a day to every 3 days)  When was your last BM?       Denies- pretty regular  6. CAUSE: What do you think is causing the anus symptoms?     Patient reports feeling pain after big sneeze  7. OTHER SYMPTOMS: Do you have any other symptoms?  (e.g., abdomen pain, fever, rectal bleeding, vomiting)     Small area protruding out of rectum - size of the tip of my pinkie  Protocols used: Rectal Symptoms-A-AH

## 2024-08-30 ENCOUNTER — Encounter: Payer: Self-pay | Admitting: Internal Medicine

## 2024-08-30 ENCOUNTER — Ambulatory Visit: Payer: Self-pay | Admitting: Internal Medicine

## 2024-08-30 VITALS — BP 134/80 | HR 72 | Ht 70.0 in | Wt 210.8 lb

## 2024-08-30 DIAGNOSIS — K645 Perianal venous thrombosis: Secondary | ICD-10-CM | POA: Insufficient documentation

## 2024-08-30 MED ORDER — TRIAMCINOLONE ACETONIDE 0.5 % EX OINT: 1.0000 | TOPICAL_OINTMENT | Freq: Four times a day (QID) | CUTANEOUS | 1 refills | Status: AC

## 2024-08-30 NOTE — Assessment & Plan Note (Addendum)
 1 cm oval sensitive external thrombosed hemorrhoid at 3:00 Hopefully it will resolve with conservative treatment.  Sitz bath's with Epsom salt, triamcinolone  ointment up to 4 times a day. Use wet wipes or soap and water for cleaning Return to clinic if worse or not better

## 2024-08-30 NOTE — Patient Instructions (Addendum)
 Sitz baths with Epsom salt

## 2024-08-30 NOTE — Progress Notes (Signed)
 "  Subjective:  Patient ID: Anthony Steele, male    DOB: Dec 02, 1960  Age: 64 y.o. MRN: [id number]  CC: Rectal Pain (Rectal pain, trace amounts of bleeding, for 1 week. Treating with Prep-H which has helped some)   HPI Anthony Steele presents for hemorrhoid since last week - using  preparation H Last colon 07/2023  Outpatient Medications Prior to Visit  Medication Sig Dispense Refill   bictegravir-emtricitabine -tenofovir  AF (BIKTARVY ) 50-200-25 MG TABS tablet Take 1 tablet by mouth daily. Try to take at the same time each day with or without food. 30 tablet 5   diclofenac  (VOLTAREN ) 75 MG EC tablet Take 75 mg by mouth 2 (two) times daily.     esomeprazole (NEXIUM) 40 MG capsule TAKE 1 CAPSULE (40 MG TOTAL) BY MOUTH DAILY. 30 capsule 11   ezetimibe  (ZETIA ) 10 MG tablet TAKE 1 TABLET BY MOUTH EVERY DAY 90 tablet 3   fexofenadine  (ALLEGRA ) 180 MG tablet Take 1 tablet (180 mg total) by mouth daily. 90 tablet 3   Multiple Vitamins-Minerals (MULTIVITAMIN WITH MINERALS) tablet Take 1 tablet by mouth daily.     podofilox  (CONDYLOX ) 0.5 % external solution Apply topically every 12 hours in the morning and evening for 3 days, then withhold for 4 days; repeat cycle up to 4 times 3.5 mL 3   rosuvastatin  (CRESTOR ) 20 MG tablet Take 1 tablet (20 mg total) by mouth daily. 90 tablet 1   tiZANidine  (ZANAFLEX ) 4 MG tablet Take 4 mg by mouth at bedtime.     triamcinolone  (NASACORT ) 55 MCG/ACT AERO nasal inhaler Place 2 sprays into the nose daily. 1 each 12   valACYclovir  (VALTREX ) 500 MG tablet Take 1 tablet (500 mg total) by mouth 2 (two) times daily. 60 tablet 5   No facility-administered medications prior to visit.    ROS: Review of Systems  Constitutional:  Negative for chills and fever.  Gastrointestinal:  Negative for abdominal pain, anal bleeding, blood in stool, constipation, diarrhea, nausea and vomiting.    Objective:  BP 134/80   Pulse 72   Ht 5' 10 (1.778 m)   Wt 210 lb 12.8 oz (95.6 kg)    SpO2 97%   BMI 30.25 kg/m   BP Readings from Last 3 Encounters:  08/30/24 134/80  05/15/24 (!) 150/85  04/18/24 (!) 140/98    Wt Readings from Last 3 Encounters:  08/30/24 210 lb 12.8 oz (95.6 kg)  05/15/24 201 lb (91.2 kg)  04/18/24 206 lb (93.4 kg)    Physical Exam Constitutional:      Appearance: Normal appearance. He is not toxic-appearing.   1 cm oval sensitive external thrombosed hemorrhoid at 3:00, Soft to touch  Lab Results  Component Value Date   WBC 8.5 05/15/2024   HGB 15.3 05/15/2024   HCT 44.8 05/15/2024   PLT 193 05/15/2024   GLUCOSE 110 (H) 05/15/2024   CHOL 131 04/18/2024   TRIG 55.0 04/18/2024   HDL 56.10 04/18/2024   LDLCALC 64 04/18/2024   ALT 50 (H) 05/15/2024   AST 38 (H) 05/15/2024   NA 140 05/15/2024   K 4.5 05/15/2024   CL 103 05/15/2024   CREATININE 0.83 05/15/2024   BUN 22 05/15/2024   CO2 28 05/15/2024   TSH 3.16 04/18/2024   PSA 0.23 04/18/2024   INR 1.0 09/21/2023   HGBA1C 5.4 04/18/2024    No results found.  Assessment & Plan:   Problem List Items Addressed This Visit     External hemorrhoid, thrombosed -  Primary   1 cm oval sensitive external thrombosed hemorrhoid at 3:00 Hopefully it will resolve with conservative treatment.  Sitz bath's with Epsom salt, triamcinolone  ointment up to 4 times a day. Use wet wipes or soap and water for cleaning Return to clinic if worse or not better          Meds ordered this encounter  Medications   triamcinolone  ointment (KENALOG ) 0.5 %    Sig: Apply 1 Application topically 4 (four) times daily.    Dispense:  120 g    Refill:  1      Follow-up: No follow-ups on file.  Marolyn Noel, MD "

## 2024-11-25 ENCOUNTER — Ambulatory Visit: Admitting: Physician Assistant

## 2025-02-24 ENCOUNTER — Encounter: Payer: Self-pay | Admitting: Family Medicine
